# Patient Record
Sex: Female | Born: 1945 | Race: White | Hispanic: No | Smoking: Former smoker
Health system: Southern US, Community
[De-identification: ages and names within clinical notes are randomized; demographics above are authoritative.]

## PROBLEM LIST (undated history)

## (undated) DIAGNOSIS — J45909 Unspecified asthma, uncomplicated: Secondary | ICD-10-CM

## (undated) DIAGNOSIS — C50919 Malignant neoplasm of unspecified site of unspecified female breast: Secondary | ICD-10-CM

## (undated) DIAGNOSIS — I4891 Unspecified atrial fibrillation: Secondary | ICD-10-CM

## (undated) DIAGNOSIS — M1990 Unspecified osteoarthritis, unspecified site: Secondary | ICD-10-CM

## (undated) DIAGNOSIS — I10 Essential (primary) hypertension: Secondary | ICD-10-CM

## (undated) DIAGNOSIS — I429 Cardiomyopathy, unspecified: Secondary | ICD-10-CM

## (undated) DIAGNOSIS — R011 Cardiac murmur, unspecified: Secondary | ICD-10-CM

## (undated) DIAGNOSIS — M810 Age-related osteoporosis without current pathological fracture: Secondary | ICD-10-CM

## (undated) DIAGNOSIS — I2510 Atherosclerotic heart disease of native coronary artery without angina pectoris: Secondary | ICD-10-CM

## (undated) DIAGNOSIS — I639 Cerebral infarction, unspecified: Secondary | ICD-10-CM

## (undated) DIAGNOSIS — K219 Gastro-esophageal reflux disease without esophagitis: Secondary | ICD-10-CM

## (undated) DIAGNOSIS — I509 Heart failure, unspecified: Secondary | ICD-10-CM

## (undated) DIAGNOSIS — I219 Acute myocardial infarction, unspecified: Secondary | ICD-10-CM

## (undated) DIAGNOSIS — H269 Unspecified cataract: Secondary | ICD-10-CM

## (undated) DIAGNOSIS — T7840XA Allergy, unspecified, initial encounter: Secondary | ICD-10-CM

## (undated) DIAGNOSIS — E785 Hyperlipidemia, unspecified: Secondary | ICD-10-CM

## (undated) HISTORY — DX: Age-related osteoporosis without current pathological fracture: M81.0

## (undated) HISTORY — DX: Acute myocardial infarction, unspecified: I21.9

## (undated) HISTORY — PX: BREAST SURGERY: SHX581

## (undated) HISTORY — PX: APPENDECTOMY: SHX54

## (undated) HISTORY — DX: Essential (primary) hypertension: I10

## (undated) HISTORY — PX: CHOLECYSTECTOMY: SHX55

## (undated) HISTORY — DX: Cerebral infarction, unspecified: I63.9

## (undated) HISTORY — DX: Atherosclerotic heart disease of native coronary artery without angina pectoris: I25.10

## (undated) HISTORY — DX: Cardiac murmur, unspecified: R01.1

## (undated) HISTORY — DX: Unspecified osteoarthritis, unspecified site: M19.90

## (undated) HISTORY — PX: TUBAL LIGATION: SHX77

## (undated) HISTORY — DX: Malignant neoplasm of unspecified site of unspecified female breast: C50.919

## (undated) HISTORY — PX: CARDIAC CATHETERIZATION: SHX172

## (undated) HISTORY — DX: Allergy, unspecified, initial encounter: T78.40XA

## (undated) HISTORY — DX: Unspecified cataract: H26.9

## (undated) HISTORY — DX: Gastro-esophageal reflux disease without esophagitis: K21.9

## (undated) HISTORY — DX: Cardiomyopathy, unspecified: I42.9

## (undated) HISTORY — DX: Unspecified asthma, uncomplicated: J45.909

## (undated) HISTORY — PX: CORONARY ARTERY BYPASS GRAFT: SHX141

## (undated) HISTORY — DX: Unspecified atrial fibrillation: I48.91

## (undated) HISTORY — DX: Heart failure, unspecified: I50.9

## (undated) HISTORY — DX: Hyperlipidemia, unspecified: E78.5

## (undated) HISTORY — PX: EYE SURGERY: SHX253

## (undated) HISTORY — PX: FRACTURE SURGERY: SHX138

## (undated) HISTORY — PX: CARPAL TUNNEL RELEASE: SHX101

---

## 2015-12-08 DIAGNOSIS — I10 Essential (primary) hypertension: Secondary | ICD-10-CM | POA: Insufficient documentation

## 2015-12-08 DIAGNOSIS — Z951 Presence of aortocoronary bypass graft: Secondary | ICD-10-CM | POA: Insufficient documentation

## 2015-12-08 DIAGNOSIS — E039 Hypothyroidism, unspecified: Secondary | ICD-10-CM | POA: Insufficient documentation

## 2016-01-08 DIAGNOSIS — K219 Gastro-esophageal reflux disease without esophagitis: Secondary | ICD-10-CM | POA: Insufficient documentation

## 2016-11-09 DIAGNOSIS — N393 Stress incontinence (female) (male): Secondary | ICD-10-CM | POA: Insufficient documentation

## 2017-01-13 DIAGNOSIS — Z87442 Personal history of urinary calculi: Secondary | ICD-10-CM | POA: Insufficient documentation

## 2020-04-29 DIAGNOSIS — Z139 Encounter for screening, unspecified: Secondary | ICD-10-CM | POA: Diagnosis not present

## 2020-04-29 DIAGNOSIS — E039 Hypothyroidism, unspecified: Secondary | ICD-10-CM | POA: Diagnosis not present

## 2020-04-29 DIAGNOSIS — Z79899 Other long term (current) drug therapy: Secondary | ICD-10-CM | POA: Diagnosis not present

## 2020-04-29 DIAGNOSIS — Z1211 Encounter for screening for malignant neoplasm of colon: Secondary | ICD-10-CM | POA: Diagnosis not present

## 2020-04-29 DIAGNOSIS — E785 Hyperlipidemia, unspecified: Secondary | ICD-10-CM | POA: Diagnosis not present

## 2020-04-29 DIAGNOSIS — I10 Essential (primary) hypertension: Secondary | ICD-10-CM | POA: Diagnosis not present

## 2020-04-29 DIAGNOSIS — E559 Vitamin D deficiency, unspecified: Secondary | ICD-10-CM | POA: Diagnosis not present

## 2020-04-29 DIAGNOSIS — K219 Gastro-esophageal reflux disease without esophagitis: Secondary | ICD-10-CM | POA: Diagnosis not present

## 2020-04-29 DIAGNOSIS — Z9181 History of falling: Secondary | ICD-10-CM | POA: Diagnosis not present

## 2020-05-07 DIAGNOSIS — Z1212 Encounter for screening for malignant neoplasm of rectum: Secondary | ICD-10-CM | POA: Diagnosis not present

## 2020-05-07 DIAGNOSIS — Z1211 Encounter for screening for malignant neoplasm of colon: Secondary | ICD-10-CM | POA: Diagnosis not present

## 2020-05-10 LAB — COLOGUARD: COLOGUARD: NEGATIVE

## 2020-05-10 LAB — EXTERNAL GENERIC LAB PROCEDURE: COLOGUARD: NEGATIVE

## 2020-06-03 DIAGNOSIS — R05 Cough: Secondary | ICD-10-CM | POA: Diagnosis not present

## 2020-06-03 DIAGNOSIS — K219 Gastro-esophageal reflux disease without esophagitis: Secondary | ICD-10-CM | POA: Diagnosis not present

## 2020-06-03 DIAGNOSIS — E559 Vitamin D deficiency, unspecified: Secondary | ICD-10-CM | POA: Diagnosis not present

## 2020-06-03 DIAGNOSIS — E039 Hypothyroidism, unspecified: Secondary | ICD-10-CM | POA: Diagnosis not present

## 2020-06-03 DIAGNOSIS — Z1231 Encounter for screening mammogram for malignant neoplasm of breast: Secondary | ICD-10-CM | POA: Diagnosis not present

## 2020-06-03 DIAGNOSIS — J309 Allergic rhinitis, unspecified: Secondary | ICD-10-CM | POA: Diagnosis not present

## 2020-06-03 DIAGNOSIS — E785 Hyperlipidemia, unspecified: Secondary | ICD-10-CM | POA: Diagnosis not present

## 2020-06-03 DIAGNOSIS — I10 Essential (primary) hypertension: Secondary | ICD-10-CM | POA: Diagnosis not present

## 2020-06-03 DIAGNOSIS — Z6834 Body mass index (BMI) 34.0-34.9, adult: Secondary | ICD-10-CM | POA: Diagnosis not present

## 2020-06-17 DIAGNOSIS — Z961 Presence of intraocular lens: Secondary | ICD-10-CM | POA: Diagnosis not present

## 2020-06-17 DIAGNOSIS — H43393 Other vitreous opacities, bilateral: Secondary | ICD-10-CM | POA: Diagnosis not present

## 2020-06-17 DIAGNOSIS — H04129 Dry eye syndrome of unspecified lacrimal gland: Secondary | ICD-10-CM | POA: Diagnosis not present

## 2020-06-25 DIAGNOSIS — Z780 Asymptomatic menopausal state: Secondary | ICD-10-CM | POA: Diagnosis not present

## 2020-06-25 DIAGNOSIS — E2839 Other primary ovarian failure: Secondary | ICD-10-CM | POA: Diagnosis not present

## 2020-06-25 DIAGNOSIS — Z1231 Encounter for screening mammogram for malignant neoplasm of breast: Secondary | ICD-10-CM | POA: Diagnosis not present

## 2020-06-25 DIAGNOSIS — M85851 Other specified disorders of bone density and structure, right thigh: Secondary | ICD-10-CM | POA: Diagnosis not present

## 2020-09-02 DIAGNOSIS — J309 Allergic rhinitis, unspecified: Secondary | ICD-10-CM | POA: Diagnosis not present

## 2020-09-02 DIAGNOSIS — E559 Vitamin D deficiency, unspecified: Secondary | ICD-10-CM | POA: Diagnosis not present

## 2020-09-02 DIAGNOSIS — E039 Hypothyroidism, unspecified: Secondary | ICD-10-CM | POA: Diagnosis not present

## 2020-09-02 DIAGNOSIS — I10 Essential (primary) hypertension: Secondary | ICD-10-CM | POA: Diagnosis not present

## 2020-09-02 DIAGNOSIS — R059 Cough, unspecified: Secondary | ICD-10-CM | POA: Diagnosis not present

## 2020-09-02 DIAGNOSIS — Z6834 Body mass index (BMI) 34.0-34.9, adult: Secondary | ICD-10-CM | POA: Diagnosis not present

## 2020-09-02 DIAGNOSIS — E785 Hyperlipidemia, unspecified: Secondary | ICD-10-CM | POA: Diagnosis not present

## 2020-09-02 DIAGNOSIS — K219 Gastro-esophageal reflux disease without esophagitis: Secondary | ICD-10-CM | POA: Diagnosis not present

## 2020-09-02 DIAGNOSIS — N289 Disorder of kidney and ureter, unspecified: Secondary | ICD-10-CM | POA: Diagnosis not present

## 2020-11-06 DIAGNOSIS — R350 Frequency of micturition: Secondary | ICD-10-CM | POA: Diagnosis not present

## 2020-11-06 DIAGNOSIS — K219 Gastro-esophageal reflux disease without esophagitis: Secondary | ICD-10-CM | POA: Diagnosis not present

## 2020-11-06 DIAGNOSIS — Z6835 Body mass index (BMI) 35.0-35.9, adult: Secondary | ICD-10-CM | POA: Diagnosis not present

## 2021-01-06 DIAGNOSIS — N289 Disorder of kidney and ureter, unspecified: Secondary | ICD-10-CM | POA: Diagnosis not present

## 2021-01-06 DIAGNOSIS — Z6834 Body mass index (BMI) 34.0-34.9, adult: Secondary | ICD-10-CM | POA: Diagnosis not present

## 2021-01-06 DIAGNOSIS — I10 Essential (primary) hypertension: Secondary | ICD-10-CM | POA: Diagnosis not present

## 2021-01-06 DIAGNOSIS — J309 Allergic rhinitis, unspecified: Secondary | ICD-10-CM | POA: Diagnosis not present

## 2021-01-06 DIAGNOSIS — E039 Hypothyroidism, unspecified: Secondary | ICD-10-CM | POA: Diagnosis not present

## 2021-01-06 DIAGNOSIS — R059 Cough, unspecified: Secondary | ICD-10-CM | POA: Diagnosis not present

## 2021-01-06 DIAGNOSIS — K219 Gastro-esophageal reflux disease without esophagitis: Secondary | ICD-10-CM | POA: Diagnosis not present

## 2021-01-06 DIAGNOSIS — E559 Vitamin D deficiency, unspecified: Secondary | ICD-10-CM | POA: Diagnosis not present

## 2021-01-06 DIAGNOSIS — E785 Hyperlipidemia, unspecified: Secondary | ICD-10-CM | POA: Diagnosis not present

## 2021-01-06 DIAGNOSIS — Z79899 Other long term (current) drug therapy: Secondary | ICD-10-CM | POA: Diagnosis not present

## 2021-01-06 DIAGNOSIS — N390 Urinary tract infection, site not specified: Secondary | ICD-10-CM | POA: Diagnosis not present

## 2021-04-02 DIAGNOSIS — E039 Hypothyroidism, unspecified: Secondary | ICD-10-CM | POA: Diagnosis not present

## 2021-04-02 DIAGNOSIS — N289 Disorder of kidney and ureter, unspecified: Secondary | ICD-10-CM | POA: Diagnosis not present

## 2021-04-02 DIAGNOSIS — I10 Essential (primary) hypertension: Secondary | ICD-10-CM | POA: Diagnosis not present

## 2021-04-02 DIAGNOSIS — J309 Allergic rhinitis, unspecified: Secondary | ICD-10-CM | POA: Diagnosis not present

## 2021-04-02 DIAGNOSIS — Z6834 Body mass index (BMI) 34.0-34.9, adult: Secondary | ICD-10-CM | POA: Diagnosis not present

## 2021-04-02 DIAGNOSIS — E785 Hyperlipidemia, unspecified: Secondary | ICD-10-CM | POA: Diagnosis not present

## 2021-04-02 DIAGNOSIS — R059 Cough, unspecified: Secondary | ICD-10-CM | POA: Diagnosis not present

## 2021-04-02 DIAGNOSIS — E559 Vitamin D deficiency, unspecified: Secondary | ICD-10-CM | POA: Diagnosis not present

## 2021-04-02 DIAGNOSIS — K219 Gastro-esophageal reflux disease without esophagitis: Secondary | ICD-10-CM | POA: Diagnosis not present

## 2021-05-08 DIAGNOSIS — E669 Obesity, unspecified: Secondary | ICD-10-CM | POA: Diagnosis not present

## 2021-05-08 DIAGNOSIS — E785 Hyperlipidemia, unspecified: Secondary | ICD-10-CM | POA: Diagnosis not present

## 2021-05-08 DIAGNOSIS — Z139 Encounter for screening, unspecified: Secondary | ICD-10-CM | POA: Diagnosis not present

## 2021-05-08 DIAGNOSIS — Z0000 Encounter for general adult medical examination without abnormal findings: Secondary | ICD-10-CM | POA: Diagnosis not present

## 2021-05-08 DIAGNOSIS — Z9181 History of falling: Secondary | ICD-10-CM | POA: Diagnosis not present

## 2021-05-08 DIAGNOSIS — Z1331 Encounter for screening for depression: Secondary | ICD-10-CM | POA: Diagnosis not present

## 2021-05-22 ENCOUNTER — Other Ambulatory Visit: Payer: Self-pay

## 2021-05-22 ENCOUNTER — Emergency Department (HOSPITAL_COMMUNITY): Payer: Medicare HMO

## 2021-05-22 ENCOUNTER — Inpatient Hospital Stay (HOSPITAL_COMMUNITY)
Admission: EM | Admit: 2021-05-22 | Discharge: 2021-05-24 | DRG: 062 | Disposition: A | Discharge status: Home / Self Care | Payer: Medicare HMO | Attending: Neurology | Admitting: Neurology

## 2021-05-22 DIAGNOSIS — Z87891 Personal history of nicotine dependence: Secondary | ICD-10-CM

## 2021-05-22 DIAGNOSIS — E039 Hypothyroidism, unspecified: Secondary | ICD-10-CM | POA: Diagnosis not present

## 2021-05-22 DIAGNOSIS — Z713 Dietary counseling and surveillance: Secondary | ICD-10-CM

## 2021-05-22 DIAGNOSIS — Z9010 Acquired absence of unspecified breast and nipple: Secondary | ICD-10-CM | POA: Diagnosis not present

## 2021-05-22 DIAGNOSIS — Z20822 Contact with and (suspected) exposure to covid-19: Secondary | ICD-10-CM | POA: Diagnosis not present

## 2021-05-22 DIAGNOSIS — I6381 Other cerebral infarction due to occlusion or stenosis of small artery: Principal | ICD-10-CM | POA: Diagnosis present

## 2021-05-22 DIAGNOSIS — Z8249 Family history of ischemic heart disease and other diseases of the circulatory system: Secondary | ICD-10-CM | POA: Diagnosis not present

## 2021-05-22 DIAGNOSIS — G8191 Hemiplegia, unspecified affecting right dominant side: Secondary | ICD-10-CM | POA: Diagnosis present

## 2021-05-22 DIAGNOSIS — I491 Atrial premature depolarization: Secondary | ICD-10-CM | POA: Diagnosis not present

## 2021-05-22 DIAGNOSIS — R29704 NIHSS score 4: Secondary | ICD-10-CM | POA: Diagnosis present

## 2021-05-22 DIAGNOSIS — I252 Old myocardial infarction: Secondary | ICD-10-CM | POA: Diagnosis not present

## 2021-05-22 DIAGNOSIS — E119 Type 2 diabetes mellitus without complications: Secondary | ICD-10-CM | POA: Diagnosis present

## 2021-05-22 DIAGNOSIS — Z881 Allergy status to other antibiotic agents status: Secondary | ICD-10-CM | POA: Diagnosis not present

## 2021-05-22 DIAGNOSIS — I63233 Cerebral infarction due to unspecified occlusion or stenosis of bilateral carotid arteries: Secondary | ICD-10-CM | POA: Diagnosis not present

## 2021-05-22 DIAGNOSIS — Z853 Personal history of malignant neoplasm of breast: Secondary | ICD-10-CM

## 2021-05-22 DIAGNOSIS — I4891 Unspecified atrial fibrillation: Secondary | ICD-10-CM | POA: Diagnosis not present

## 2021-05-22 DIAGNOSIS — Z6834 Body mass index (BMI) 34.0-34.9, adult: Secondary | ICD-10-CM | POA: Diagnosis not present

## 2021-05-22 DIAGNOSIS — Z888 Allergy status to other drugs, medicaments and biological substances status: Secondary | ICD-10-CM

## 2021-05-22 DIAGNOSIS — Z7982 Long term (current) use of aspirin: Secondary | ICD-10-CM

## 2021-05-22 DIAGNOSIS — E785 Hyperlipidemia, unspecified: Secondary | ICD-10-CM | POA: Diagnosis present

## 2021-05-22 DIAGNOSIS — Z87442 Personal history of urinary calculi: Secondary | ICD-10-CM

## 2021-05-22 DIAGNOSIS — Z7951 Long term (current) use of inhaled steroids: Secondary | ICD-10-CM

## 2021-05-22 DIAGNOSIS — R000 Tachycardia, unspecified: Secondary | ICD-10-CM | POA: Diagnosis not present

## 2021-05-22 DIAGNOSIS — Z951 Presence of aortocoronary bypass graft: Secondary | ICD-10-CM

## 2021-05-22 DIAGNOSIS — K219 Gastro-esophageal reflux disease without esophagitis: Secondary | ICD-10-CM | POA: Diagnosis present

## 2021-05-22 DIAGNOSIS — R2990 Unspecified symptoms and signs involving the nervous system: Secondary | ICD-10-CM

## 2021-05-22 DIAGNOSIS — R29818 Other symptoms and signs involving the nervous system: Secondary | ICD-10-CM | POA: Diagnosis not present

## 2021-05-22 DIAGNOSIS — I2510 Atherosclerotic heart disease of native coronary artery without angina pectoris: Secondary | ICD-10-CM | POA: Diagnosis present

## 2021-05-22 DIAGNOSIS — I10 Essential (primary) hypertension: Secondary | ICD-10-CM | POA: Diagnosis not present

## 2021-05-22 DIAGNOSIS — I639 Cerebral infarction, unspecified: Secondary | ICD-10-CM | POA: Diagnosis not present

## 2021-05-22 DIAGNOSIS — Z79899 Other long term (current) drug therapy: Secondary | ICD-10-CM

## 2021-05-22 DIAGNOSIS — E6609 Other obesity due to excess calories: Secondary | ICD-10-CM | POA: Diagnosis present

## 2021-05-22 DIAGNOSIS — Z79890 Hormone replacement therapy (postmenopausal): Secondary | ICD-10-CM | POA: Diagnosis not present

## 2021-05-22 DIAGNOSIS — R270 Ataxia, unspecified: Secondary | ICD-10-CM | POA: Diagnosis present

## 2021-05-22 DIAGNOSIS — I499 Cardiac arrhythmia, unspecified: Secondary | ICD-10-CM | POA: Diagnosis not present

## 2021-05-22 DIAGNOSIS — I429 Cardiomyopathy, unspecified: Secondary | ICD-10-CM | POA: Diagnosis not present

## 2021-05-22 DIAGNOSIS — R0902 Hypoxemia: Secondary | ICD-10-CM | POA: Diagnosis not present

## 2021-05-22 DIAGNOSIS — E782 Mixed hyperlipidemia: Secondary | ICD-10-CM | POA: Diagnosis not present

## 2021-05-22 LAB — ETHANOL: Alcohol, Ethyl (B): <10 mg/dL (ref <10)

## 2021-05-22 LAB — COMPREHENSIVE METABOLIC PANEL
ALT: 22 U/L (ref 0–44)
AST: 20 U/L (ref 15–41)
Albumin: 3.8 g/dL (ref 3.5–5.0)
Alkaline Phosphatase: 74 U/L (ref 38–126)
Anion gap: 7 (ref 5–15)
BUN: 20 mg/dL (ref 8–23)
CO2: 24 mmol/L (ref 22–32)
Calcium: 8.8 mg/dL — ABNORMAL LOW (ref 8.9–10.3)
Chloride: 106 mmol/L (ref 98–111)
Creatinine, Ser: 1.15 mg/dL — ABNORMAL HIGH (ref 0.44–1.00)
GFR, Estimated: 50 mL/min — ABNORMAL LOW (ref >60)
Glucose, Bld: 117 mg/dL — ABNORMAL HIGH (ref 70–99)
Potassium: 3.7 mmol/L (ref 3.5–5.1)
Sodium: 137 mmol/L (ref 135–145)
Total Bilirubin: 0.8 mg/dL (ref 0.3–1.2)
Total Protein: 6.6 g/dL (ref 6.5–8.1)

## 2021-05-22 LAB — DIFFERENTIAL
Abs Immature Granulocytes: 0.04 10*3/uL (ref 0.00–0.07)
Basophils Absolute: 0.1 10*3/uL (ref 0.0–0.1)
Basophils Relative: 1 %
Eosinophils Absolute: 0.1 10*3/uL (ref 0.0–0.5)
Eosinophils Relative: 1 %
Immature Granulocytes: 0 %
Lymphocytes Relative: 19 %
Lymphs Abs: 2.2 10*3/uL (ref 0.7–4.0)
Monocytes Absolute: 0.8 10*3/uL (ref 0.1–1.0)
Monocytes Relative: 7 %
Neutro Abs: 8.2 10*3/uL — ABNORMAL HIGH (ref 1.7–7.7)
Neutrophils Relative %: 72 %

## 2021-05-22 LAB — APTT: aPTT: 25 seconds (ref 24–36)

## 2021-05-22 LAB — I-STAT CHEM 8, ED
BUN: 21 mg/dL (ref 8–23)
Calcium, Ion: 1.14 mmol/L — ABNORMAL LOW (ref 1.15–1.40)
Chloride: 105 mmol/L (ref 98–111)
Creatinine, Ser: 1.1 mg/dL — ABNORMAL HIGH (ref 0.44–1.00)
Glucose, Bld: 115 mg/dL — ABNORMAL HIGH (ref 70–99)
HCT: 37 % (ref 36.0–46.0)
Hemoglobin: 12.6 g/dL (ref 12.0–15.0)
Potassium: 3.8 mmol/L (ref 3.5–5.1)
Sodium: 140 mmol/L (ref 135–145)
TCO2: 24 mmol/L (ref 22–32)

## 2021-05-22 LAB — URINALYSIS, ROUTINE W REFLEX MICROSCOPIC
Bilirubin Urine: NEGATIVE
Glucose, UA: NEGATIVE mg/dL
Hgb urine dipstick: NEGATIVE
Ketones, ur: NEGATIVE mg/dL
Leukocytes,Ua: NEGATIVE
Nitrite: NEGATIVE
Protein, ur: NEGATIVE mg/dL
Specific Gravity, Urine: 1.005 (ref 1.005–1.030)
pH: 7 (ref 5.0–8.0)

## 2021-05-22 LAB — CBC
HCT: 38.6 % (ref 36.0–46.0)
Hemoglobin: 12.9 g/dL (ref 12.0–15.0)
MCH: 31.3 pg (ref 26.0–34.0)
MCHC: 33.4 g/dL (ref 30.0–36.0)
MCV: 93.7 fL (ref 80.0–100.0)
Platelets: 204 10*3/uL (ref 150–400)
RBC: 4.12 MIL/uL (ref 3.87–5.11)
RDW: 12.8 % (ref 11.5–15.5)
WBC: 11.4 10*3/uL — ABNORMAL HIGH (ref 4.0–10.5)
nRBC: 0 % (ref 0.0–0.2)

## 2021-05-22 LAB — RAPID URINE DRUG SCREEN, HOSP PERFORMED
Amphetamines: NOT DETECTED
Barbiturates: NOT DETECTED
Benzodiazepines: NOT DETECTED
Cocaine: NOT DETECTED
Opiates: NOT DETECTED
Tetrahydrocannabinol: NOT DETECTED

## 2021-05-22 LAB — RESP PANEL BY RT-PCR (FLU A&B, COVID) ARPGX2
Influenza A by PCR: NEGATIVE
Influenza B by PCR: NEGATIVE
SARS Coronavirus 2 by RT PCR: NEGATIVE

## 2021-05-22 LAB — CBG MONITORING, ED: Glucose-Capillary: 104 mg/dL — ABNORMAL HIGH (ref 70–99)

## 2021-05-22 LAB — PROTIME-INR
INR: 1 (ref 0.8–1.2)
Prothrombin Time: 13.4 seconds (ref 11.4–15.2)

## 2021-05-22 MED ORDER — MOMETASONE FURO-FORMOTEROL FUM 200-5 MCG/ACT IN AERO
2.0000 | INHALATION_SPRAY | Freq: Two times a day (BID) | RESPIRATORY_TRACT | Status: DC
  Administered 2021-05-23 – 2021-05-24 (×2): 2 via RESPIRATORY_TRACT
  Filled 2021-05-22: qty 8.8

## 2021-05-22 MED ORDER — SENNOSIDES-DOCUSATE SODIUM 8.6-50 MG PO TABS: 1.0000 | ORAL_TABLET | Freq: Every evening | ORAL | Status: DC | PRN

## 2021-05-22 MED ORDER — ACETAMINOPHEN 160 MG/5ML PO SOLN: 650.0000 mg | ORAL | Status: DC | PRN

## 2021-05-22 MED ORDER — STROKE: EARLY STAGES OF RECOVERY BOOK: Freq: Once | Status: DC

## 2021-05-22 MED ORDER — HYDRALAZINE HCL 20 MG/ML IJ SOLN: 5.0000 mg | Freq: Once | INTRAMUSCULAR | Status: DC

## 2021-05-22 MED ORDER — ALTEPLASE (STROKE) FULL DOSE INFUSION
0.9000 mg/kg | Freq: Once | INTRAVENOUS | Status: AC
  Administered 2021-05-22: 77.6 mg via INTRAVENOUS

## 2021-05-22 MED ORDER — PANTOPRAZOLE SODIUM 40 MG IV SOLR
40.0000 mg | Freq: Every day | INTRAVENOUS | Status: DC
  Administered 2021-05-22 – 2021-05-23 (×2): 40 mg via INTRAVENOUS
  Filled 2021-05-22 (×2): qty 40

## 2021-05-22 MED ORDER — ROSUVASTATIN CALCIUM 5 MG PO TABS
10.0000 mg | ORAL_TABLET | Freq: Every day | ORAL | Status: DC
  Administered 2021-05-23 – 2021-05-24 (×2): 10 mg via ORAL
  Filled 2021-05-22: qty 2
  Filled 2021-05-22 (×2): qty 1

## 2021-05-22 MED ORDER — CLEVIDIPINE BUTYRATE 0.5 MG/ML IV EMUL: 0.0000 mg/h | INTRAVENOUS | Status: DC

## 2021-05-22 MED ORDER — LEVOTHYROXINE SODIUM 25 MCG PO TABS
125.0000 ug | ORAL_TABLET | Freq: Every day | ORAL | Status: DC
  Administered 2021-05-23 – 2021-05-24 (×2): 125 ug via ORAL
  Filled 2021-05-22 (×2): qty 1

## 2021-05-22 MED ORDER — INSULIN ASPART 100 UNIT/ML IJ SOLN
0.0000 [IU] | INTRAMUSCULAR | Status: DC
  Administered 2021-05-23 – 2021-05-24 (×2): 2 [IU] via SUBCUTANEOUS

## 2021-05-22 MED ORDER — ACETAMINOPHEN 325 MG PO TABS
650.0000 mg | ORAL_TABLET | ORAL | Status: DC | PRN
  Administered 2021-05-23 – 2021-05-24 (×3): 650 mg via ORAL
  Filled 2021-05-22 (×3): qty 2

## 2021-05-22 MED ORDER — MONTELUKAST SODIUM 10 MG PO TABS
10.0000 mg | ORAL_TABLET | Freq: Every day | ORAL | Status: DC
  Administered 2021-05-23: 10 mg via ORAL
  Filled 2021-05-22 (×2): qty 1

## 2021-05-22 MED ORDER — ACETAMINOPHEN 650 MG RE SUPP: 650.0000 mg | RECTAL | Status: DC | PRN

## 2021-05-22 MED ORDER — ALBUTEROL SULFATE HFA 108 (90 BASE) MCG/ACT IN AERS
2.0000 | INHALATION_SPRAY | RESPIRATORY_TRACT | Status: DC | PRN
  Filled 2021-05-22: qty 6.7

## 2021-05-22 MED ORDER — SODIUM CHLORIDE 0.9 % IV SOLN
50.0000 mL | Freq: Once | INTRAVENOUS | Status: AC
  Administered 2021-05-22: 50 mL via INTRAVENOUS

## 2021-05-22 NOTE — ED Provider Notes (Signed)
[facility] EMERGENCY DEPARTMENT Provider Note   CSN: [id number] Arrival date & time: 05/22/21  1615     History Chief Complaint  Patient presents with   Weakness    Leah Day is a 75 y.o. female.  Patient presents chief complaint of right arm right leg right side of the face heaviness.  Heaviness sensation started approximately 3 PM.  Just prior to that she states that she was walking around feeling fine.  Then around 3 PM she felt like the right foot was dragging her right arm felt heavy.  Denies any fall or other trauma.  She spoke with her family who called ambulance and the patient was noted to be in new onset atrial fibrillation in the ambulance and was given diltiazem prior to arrival here in the ER.  She denies any headache no chest pain or abdominal pain.  No fever no cough no vomiting no diarrhea.      No past medical history on file.  Patient Active Problem List   Diagnosis Date Noted   Stroke determined by clinical assessment ([place]) 05/22/2021      OB History   No obstetric history on file.     No family history on file.     Home Medications Prior to Admission medications   Not on File    Allergies    Patient has no allergy information on record.  Review of Systems   Review of Systems  Constitutional:  Negative for fever.  HENT:  Negative for ear pain.   Eyes:  Negative for pain.  Respiratory:  Negative for cough.   Cardiovascular:  Negative for chest pain.  Gastrointestinal:  Negative for abdominal pain.  Genitourinary:  Negative for flank pain.  Musculoskeletal:  Negative for back pain.  Skin:  Negative for rash.  Neurological:  Negative for headaches.   Physical Exam Updated Vital Signs BP 135/84 (BP Location: Right Leg)   Pulse 79   Temp 98.2 F (36.8 C) (Oral)   Resp (!) 22   Ht 5\' 2"  (1.575 m)   Wt 86.2 kg   SpO2 97%   BMI 34.75 kg/m   Physical Exam Constitutional:      General: She is not in acute  distress.    Appearance: Normal appearance.  HENT:     Head: Normocephalic.     Nose: Nose normal.  Eyes:     Extraocular Movements: Extraocular movements intact.  Cardiovascular:     Rate and Rhythm: Normal rate.  Pulmonary:     Effort: Pulmonary effort is normal.  Musculoskeletal:        General: Normal range of motion.     Cervical back: Normal range of motion.  Neurological:     Mental Status: She is alert.     Comments: Moderate drift seen in right lower extremity leg lift.  Otherwise bilateral upper extremities have no drift.  Bilateral grip strength is 5/5.  Cranial nerves II through XII intact.  Speech is normal no slurred speech noted.    ED Results / Procedures / Treatments   Labs (all labs ordered are listed, but only abnormal results are displayed) Labs Reviewed  CBC - Abnormal; Notable for the following components:      Result Value   WBC 11.4 (*)    All other components within normal limits  DIFFERENTIAL - Abnormal; Notable for the following components:   Neutro Abs 8.2 (*)    All other components within normal limits  I-STAT  CHEM 8, ED - Abnormal; Notable for the following components:   Creatinine, Ser 1.10 (*)    Glucose, Bld 115 (*)    Calcium, Ion 1.14 (*)    All other components within normal limits  RESP PANEL BY RT-PCR (FLU A&B, COVID) ARPGX2  PROTIME-INR  APTT  ETHANOL  COMPREHENSIVE METABOLIC PANEL  RAPID URINE DRUG SCREEN, HOSP PERFORMED  URINALYSIS, ROUTINE W REFLEX MICROSCOPIC    EKG EKG Interpretation  Date/Time:  Friday May 22 2021 16:20:18 EDT Ventricular Rate:  77 PR Interval:    QRS Duration: 83 QT Interval:  377 QTC Calculation: 427 R Axis:   6 Text Interpretation: Atrial fibrillation Ventricular premature complex Anterior infarct, old Confirmed by Thamas Jaegers ([id number]) on 05/22/2021 4:25:33 PM  Radiology CT HEAD CODE STROKE WO CONTRAST  Result Date: 05/22/2021 CLINICAL DATA:  Code stroke. Acute neuro deficit. Right-sided  weakness. EXAM: CT HEAD WITHOUT CONTRAST TECHNIQUE: Contiguous axial images were obtained from the base of the skull through the vertex without intravenous contrast. COMPARISON:  None. FINDINGS: Brain: No evidence of acute infarction, hemorrhage, hydrocephalus, extra-axial collection or mass lesion/mass effect. Moderate white matter changes with diffuse white matter hypodensity. Vascular: Negative for hyperdense vessel Skull: Negative Sinuses/Orbits: Paranasal sinuses clear. Bilateral cataract extraction Other: None ASPECTS ([place] Stroke Program Early CT Score) - Ganglionic level infarction (caudate, lentiform nuclei, internal capsule, insula, M1-M3 cortex): 7 - Supraganglionic infarction (M4-M6 cortex): 3 Total score (0-10 with 10 being normal): 10 IMPRESSION: 1. No acute intracranial abnormality. Moderate white matter changes consistent with chronic microvascular ischemia 2. ASPECTS is 10 3. These results were called by telephone at the time of interpretation on 05/22/2021 at 5:03 pm to provider Bhagat, who verbally acknowledged these results. Electronically Signed   By: Franchot Gallo M.D.   On: 05/22/2021 17:03    Procedures .Critical Care  Date/Time: 05/22/2021 4:42 PM Performed by: Luna Fuse, MD Authorized by: Luna Fuse, MD   Critical care provider statement:    Critical care time (minutes):  30   Critical care time was exclusive of:  Separately billable procedures and treating other patients and teaching time   Critical care was necessary to treat or prevent imminent or life-threatening deterioration of the following conditions:  CNS failure or compromise   Medications Ordered in ED Medications  alteplase (ACTIVASE) 1 mg/mL infusion SOLN 77.6 mg (has no administration in time range)    Followed by  0.9 %  sodium chloride infusion (has no administration in time range)    ED Course  I have reviewed the triage vital signs and the nursing notes.  Pertinent labs & imaging results  that were available during my care of the patient were reviewed by me and considered in my medical decision making (see chart for details).    MDM Rules/Calculators/A&P                          Given onset of symptoms approximately 1 hour 40 minutes prior to arrival, stroke alert was activated.  Discussion regarding tPA determined by neurology team.  CT head is unremarkable.  Patient admitted by neuro hospitalist team.   Final Clinical Impression(s) / ED Diagnoses Final diagnoses:  Stroke-like symptoms    Rx / DC Orders ED Discharge Orders     None        Luna Fuse, MD 05/22/21 1715

## 2021-05-22 NOTE — ED Notes (Signed)
Echocardiogram not completed by this tech clicked tab in error

## 2021-05-22 NOTE — H&P (Signed)
Neurology H&P  CC: Right arm and leg heaviness  History is obtained from: Patient and chart review   HPI: Leah Day is a 75 y.o. left handed woman with a past medical history significant for hypertension, hyperlipidemia, coronary artery disease s/p CABG (2013 per patient report), hypothyroidism, obesity, kidney stones, GERD, remote breast cancer s/p mastectomy (1994 per patient, managed surgically only without chemotherapy)  She reports she was in her normal state of health today when she had sudden onset right-sided weakness around 3 or 3:30 PM.  She denies any other symptoms including language difficulty, headache, neck pain, fevers, infectious symptoms.  tPA contraindications were reviewed via checklist and all contraindications were denied by the patient and on review of immediately available records.  Risks and benefits of tPA administration were reviewed and she agreed to receive the medication.  LKW: 3 PM PA given?:  Yes at 7824 IA performed?: No, exam not consistent with large vessel occlusion Premorbid modified rankin scale:      0 - No symptoms.  ROS: All other review of systems was negative except as noted in the HPI.   No past medical history on file. Detailed above based on outside record review, and confirmed with patient  No family history on file.  Family History Medical History Relation Comments  Alcohol abuse Father     Coronary artery disease Father  MI  Diabetes Maternal Uncle     Alzheimer's disease Mother     Osteoporosis Other        Medications Medication Sig Dispensed Refills Start Date End Date Status  aspirin 81 MG EC tablet *ANTIPLATELET*   Take 81 mg by mouth daily.    0     Active  cetirizine (ZYRTEC) 10 MG tablet   Take 10 mg by mouth.   0     Active  rosuvastatin (CRESTOR) 10 MG tablet   Indications: Dyslipidemia Take 1 tablet (10 mg total) by mouth daily. 90 tablet   3 12/20/2019   Active  SYNTHROID 125 mcg tablet   Indications: Acquired  hypothyroidism Take 1 tablet (125 mcg total) by mouth Daily at 0600. 90 tablet   3 12/20/2019   Active  esomeprazole (NEXIUM) 40 MG capsule   Indications: Gastroesophageal reflux disease without esophagitis TAKE 1 CAPSULE BY MOUTH EVERY DAY 90 capsule   3 12/20/2019   Active  TIADYLT ER 120 mg 24 hr capsule   Indications: Essential hypertension TAKE 1 CAPSULE BY MOUTH EVERY DAY 30 capsule   0 10/14/2020   Active    Allergies   Allergies Active Allergy Reactions Severity Noted Date Comments  Atenolol Swelling (ALLERGY/intolerance)   12/31/2019    Irbesartan Cough (ALLERGY/intolerance)   12/08/2015    Cephalexin Rash (ALLERGY/intolerance) Medium 08/14/2012 Other reaction(s): Rash    Atorvastatin Myalgias (intolerance)   12/08/2015    Lisinopril Cough (ALLERGY/intolerance)   12/08/2015    Simvastatin Myalgias (intolerance)   12/08/2015      Social History:  has no history on file for tobacco use, alcohol use, and drug use. Former smoking, 1.5 packs a day for 30 years,   Exam: Current vital signs: BP (!) 172/105   Pulse 76   Temp 98.2 F (36.8 C) (Oral)   Resp 17   Ht 5\' 2"  (1.575 m)   Wt 86.2 kg   SpO2 97%   BMI 34.75 kg/m  Vital signs in last 24 hours: Temp:  [98.2 F (36.8 C)] 98.2 F (36.8 C) (07/15 1618) Pulse Rate:  [  75-84] 76 (07/15 1730) Resp:  [17-22] 17 (07/15 1730) BP: (135-172)/(63-105) 172/105 (07/15 1730) SpO2:  [97 %] 97 % (07/15 1719) Weight:  [86.2 kg] 86.2 kg (07/15 1621)   Physical Exam  Constitutional: Appears well-developed and well-nourished.  Psych: Affect appropriate to situation, calm and cooperative Eyes: No scleral injection HENT: No oropharyngeal obstruction.  MSK: no joint deformities.  Cardiovascular: Irregularly irregular, no known diagnosis of atrial fibrillation but confirmed on EKG Respiratory: Effort normal, non-labored breathing GI: Soft.  No distension. There is no tenderness.  Skin: Warm dry and intact visible  skin  Neuro: Mental Status: Patient is awake, alert, oriented to person, place, month, year, and situation. Patient is able to give a clear and coherent history. No signs of aphasia or neglect Cranial Nerves: II: Visual Fields are full. Pupils are equal, round, and reactive to light.   III,IV, VI: EOMI without ptosis or diploplia.  V: Facial sensation is symmetric to light touch VII: Facial movement is symmetric.  VIII: hearing is intact to voice X: Uvula elevates symmetrically XI: Shoulder shrug is symmetric. XII: tongue is midline without atrophy or fasciculations.  Motor: Tone is normal. Bulk is normal.  Drift of the right upper extremity which is improving after tPA initiation.  Drift of the right lower extremity which is improving after tPA administration.  No drift on the left upper or lower extremities. Sensory: On initial evaluation patient was complaining of right arm paresthesias and right leg partial numbness  Reflexes: Toes are mute bilaterally.  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  Recommendations: # Left lacunar stroke - Stroke labs TSH, HgbA1c, fasting lipid panel - MRI brain  - MRA of the brain without contrast and MRA neck w/wo  - Frequent neuro checks - Echocardiogram - Hold antiplatelet agents given tPA -  Risk factor modification - Telemetry monitoring; initiation of anticoagulation for new onset atrial fibrillation pending determination of final stroke size on MRI brain - Blood pressure goal  - Post tPA for 24  hours < 180/105 - PT consult, OT consult, Speech consult - Stroke team to follow - Patient was an OPTIMIST trial candidate but due to shift change and high volume of neurological consults, orders were not placed  #Hypothyroidism -Continue home Synthroid  #Hypertension, hyperlipidemia, diabetes, use of home inhalers -Blood pressure goals as above, hold home blood pressure meds for now -Goal LDL less than 70, adjust statin as needed -Sliding scale insulin, every 4 hours for now, just when patient is ordered for diet -Continue home inhalers  Lesleigh Noe MD-PhD [facility] [phone] Available 7 AM to 7 PM, outside these hours please contact Neurologist on call listed on AMION   Total critical care time: 40 minutes   Critical care time was exclusive of separately billable procedures and treating other patients.   Critical care was necessary to treat or  prevent imminent or life-threatening deterioration.   Critical care was time spent personally by me on the following activities: development of treatment plan with patient and/or surrogate as well as nursing, discussions with consultants/primary team, evaluation of patient's response to treatment, examination of patient, obtaining history from patient or surrogate, ordering and performing treatments and interventions, ordering and review of laboratory studies, ordering and review of radiographic studies, and re-evaluation of patient's condition as needed, as documented above.

## 2021-05-22 NOTE — ED Notes (Signed)
Notified neurologist about pt BP 180/94. If the provider would like to place an order for Labetalol.

## 2021-05-22 NOTE — Progress Notes (Signed)
PHARMACIST CODE STROKE RESPONSE  Notified to mix tPA at 1704 by Dr. Curly [place] Delivered tPA to RN at 1707 > Started at 1709 after priming and pump programing  tPA dose = 7.8 mg bolus over 1 minute followed by 69.8 mg for a total dose of 77.6 mg over 1 hour  Issues/delays encountered (if applicable):  IV pump shut-down at 1707 requiring to be turned back on prior to programing pump  Heloise Purpura 05/22/21 5:33 PM

## 2021-05-22 NOTE — ED Notes (Signed)
Attempted report x1. 

## 2021-05-22 NOTE — Code Documentation (Signed)
Stroke Response Nurse Documentation Code Stroke Documentation Leah Day  is a 75 y.o.  y.o. female  arriving to [facility] ED via [facility] on 05/22/2021 with PMH of HTN, HLD, Obesity, Hypothyroidism, CAD, s/p CABGx3. Code stroke was activated by ED. Patient from home where she was LKW at 1500 and experienced s/o of R weakness/numbness. Patient taking aspirin 81 mg daily PTA. Stroke team met patient in [place] and escorted her to CT with ED RN, CBG 115, labs drawn and patient cleared for CT by Dr. Almyra Free. NIHSS 2, see documentation for details and code stroke times. Patient with right limb ataxia and right decreased sensation on exam. The following imaging was completed:  CT Head. Patient is a candidate for tPA, consent obtained and medication started at 1709. Care/Plan: VS/mNIH q47m x2h, q39m x6h, q1h x16h. Full NIH at 2 hours post tPA administration. Bedside handoff with ED RN Larene Beach.    Lenore Manner  Stroke Response RN [phone]

## 2021-05-22 NOTE — ED Triage Notes (Signed)
Pt bib ems from home c/o of right sided heaviness starting from the head, arm and leg. Pt has cardiac hx triple bypass. Upon transport pt was in new onset of A-Fib. In route pt received Cardizem 20 mg and 500 cc NS.   BP 159/61 RA 98% Restricted on left side due to mastectomy  .

## 2021-05-23 ENCOUNTER — Inpatient Hospital Stay (HOSPITAL_COMMUNITY): Payer: Medicare HMO

## 2021-05-23 ENCOUNTER — Encounter (HOSPITAL_COMMUNITY): Payer: Self-pay | Admitting: Neurology

## 2021-05-23 DIAGNOSIS — R2990 Unspecified symptoms and signs involving the nervous system: Secondary | ICD-10-CM

## 2021-05-23 LAB — TSH: TSH: 0.834 u[IU]/mL (ref 0.350–4.500)

## 2021-05-23 LAB — MRSA NEXT GEN BY PCR, NASAL: MRSA by PCR Next Gen: NOT DETECTED

## 2021-05-23 LAB — LIPID PANEL
Cholesterol: 143 mg/dL (ref 0–200)
HDL: 49 mg/dL (ref >40)
LDL Cholesterol: 73 mg/dL (ref 0–99)
Total CHOL/HDL Ratio: 2.9 RATIO
Triglycerides: 106 mg/dL (ref <150)
VLDL: 21 mg/dL (ref 0–40)

## 2021-05-23 LAB — GLUCOSE, CAPILLARY
Glucose-Capillary: 97 mg/dL (ref 70–99)
Glucose-Capillary: 117 mg/dL — ABNORMAL HIGH (ref 70–99)
Glucose-Capillary: 90 mg/dL (ref 70–99)
Glucose-Capillary: 96 mg/dL (ref 70–99)
Glucose-Capillary: 147 mg/dL — ABNORMAL HIGH (ref 70–99)
Glucose-Capillary: 111 mg/dL — ABNORMAL HIGH (ref 70–99)
Glucose-Capillary: 127 mg/dL — ABNORMAL HIGH (ref 70–99)
Glucose-Capillary: 108 mg/dL — ABNORMAL HIGH (ref 70–99)

## 2021-05-23 LAB — ECHOCARDIOGRAM COMPLETE
Area-P 1/2: 4.06 cm2
Calc EF: 35.7 %
Height: 62 in
S' Lateral: 3.9 cm
Single Plane A2C EF: 22.6 %
Single Plane A4C EF: 46.3 %
Weight: 3121.71 oz

## 2021-05-23 LAB — BRAIN NATRIURETIC PEPTIDE: B Natriuretic Peptide: 408.2 pg/mL — ABNORMAL HIGH (ref 0.0–100.0)

## 2021-05-23 LAB — HEMOGLOBIN A1C
Hgb A1c MFr Bld: 5.6 % (ref 4.8–5.6)
Mean Plasma Glucose: 114.02 mg/dL

## 2021-05-23 MED ORDER — ALBUTEROL SULFATE (2.5 MG/3ML) 0.083% IN NEBU: 2.5000 mg | INHALATION_SOLUTION | RESPIRATORY_TRACT | Status: DC | PRN

## 2021-05-23 MED ORDER — APIXABAN 5 MG PO TABS
5.0000 mg | ORAL_TABLET | Freq: Two times a day (BID) | ORAL | Status: DC
  Administered 2021-05-23 – 2021-05-24 (×2): 5 mg via ORAL
  Filled 2021-05-23 (×2): qty 1

## 2021-05-23 MED ORDER — GADOBUTROL 1 MMOL/ML IV SOLN
8.8000 mL | Freq: Once | INTRAVENOUS | Status: AC | PRN
  Administered 2021-05-23: 8.8 mL via INTRAVENOUS

## 2021-05-23 MED ORDER — CHLORHEXIDINE GLUCONATE CLOTH 2 % EX PADS
6.0000 | MEDICATED_PAD | Freq: Every day | CUTANEOUS | Status: DC
  Administered 2021-05-23 – 2021-05-24 (×2): 6 via TOPICAL

## 2021-05-23 MED ORDER — SACUBITRIL-VALSARTAN 24-26 MG PO TABS
1.0000 | ORAL_TABLET | Freq: Two times a day (BID) | ORAL | Status: DC
  Administered 2021-05-24: 1 via ORAL
  Filled 2021-05-23 (×3): qty 1

## 2021-05-23 MED ORDER — METOPROLOL TARTRATE 25 MG PO TABS
25.0000 mg | ORAL_TABLET | Freq: Two times a day (BID) | ORAL | Status: DC
  Administered 2021-05-24: 25 mg via ORAL
  Filled 2021-05-23: qty 1

## 2021-05-23 MED ORDER — DILTIAZEM HCL ER COATED BEADS 120 MG PO CP24
120.0000 mg | ORAL_CAPSULE | Freq: Every day | ORAL | Status: DC
  Administered 2021-05-23: 120 mg via ORAL
  Filled 2021-05-23: qty 1

## 2021-05-23 MED ORDER — HYDRALAZINE HCL 20 MG/ML IJ SOLN: 10.0000 mg | INTRAMUSCULAR | Status: DC | PRN

## 2021-05-23 MED ORDER — PERFLUTREN LIPID MICROSPHERE
1.0000 mL | INTRAVENOUS | Status: AC | PRN
  Administered 2021-05-23: 2 mL via INTRAVENOUS
  Filled 2021-05-23: qty 10

## 2021-05-23 NOTE — Progress Notes (Signed)
  Echocardiogram 2D Echocardiogram has been performed.  Darlina Sicilian M 05/23/2021, 10:46 AM

## 2021-05-23 NOTE — Progress Notes (Signed)
PT Cancellation Note  Patient Details Name: Leah Day MRN: [id number] DOB: Jun 25, 1946   Cancelled Treatment:    Reason Eval/Treat Not Completed: Active bedrest order.   Leighton Roach, [place]  Pager [phone] Office [place] 05/23/2021, 8:03 AM

## 2021-05-23 NOTE — ED Notes (Signed)
Neurologist at bedside. Informed hydralazine and cleviprex  To be given if BP 180/105 or higher.

## 2021-05-23 NOTE — Consult Note (Signed)
CARDIOLOGY CONSULT NOTE  Patient ID: Leah Day MRN: [id number] DOB/AGE: 1946/02/02 75 y.o.  Admit date: 05/22/2021 Attending physician: Stroke, Md, MD Primary Physician:  Nicholos Johns, MD Outpatient Cardiologist: NA Inpatient Cardiologist: Rex Kras, DO, [facility]  Reason of consultation: Atrial Fibrillation  Referring physician: Dr. Antony Contras  Chief complaint: right sided heaviness arm and leg.   HPI:  Leah Day is a 75 y.o. Caucasian female who presents with a chief complaint of " right sided arm and leg heaviness." Her past medical history and cardiovascular risk factors include: Established coronary artery disease status post three-vessel CABG (2013), hypertension, hyperlipidemia, history of left-sided breast cancer status postmastectomy, former smoker, postmenopausal female, advanced age, obesity due to excess calories.  Patient was in her usual health yesterday morning and later that day with walking to her pantry and started noticing her right arm and leg heaviness.  She did not have any facial asymmetry or speech impairment.  He came to the hospital and received IV tPA shortly thereafter and currently is asymptomatic.   During hospitalization she was found to have A. fib and cardiology consulted for further recommendations.  No prior history of atrial fibrillation.  Patient states that he stopped seeing a cardiologist back in 2013 after her bypass surgery.  She is not following up with her PCP.  She does not recall having an echocardiogram in the recent past.  ALLERGIES: Allergies  Allergen Reactions   Cephalexin Rash        Atenolol Swelling   Irbesartan Cough   Lisinopril Cough    PAST MEDICAL HISTORY: Coronary artery disease status post CABG 2013 at [place]. Hypertension. Hyperlipidemia. Breast cancer, left-sided status postmastectomy  PAST SURGICAL HISTORY: PCI to the coronary artery Three-vessel CABG, 2013 Bilateral carpal tunnel  surgery. Tubal ligation. Appendectomy.  FAMILY HISTORY: No family history of premature CAD.  Father passed away at the age of 43 due to myocardial infarction.  And mother passed away at the age of 40 due to Alzheimer's disease complications.  SOCIAL HISTORY:  The patient  reports that she has quit smoking. Her smoking use included cigarettes. She has a 45.00 pack-year smoking history. She has never used smokeless tobacco. She reports that she does not use drugs.  MEDICATIONS: Current Outpatient Medications  Medication Instructions   albuterol (VENTOLIN HFA) 108 (90 Base) MCG/ACT inhaler 2 puffs, Inhalation, As needed   aspirin 81 mg, Oral, Daily   esomeprazole (NEXIUM) 40 mg, Oral, Daily   famotidine (PEPCID) 20 mg, Oral, As needed   levothyroxine (SYNTHROID) 125 mcg, Oral, Daily   loratadine (CLARITIN) 10 mg, Oral, Daily   montelukast (SINGULAIR) 10 mg, Oral, Daily at bedtime   rosuvastatin (CRESTOR) 10 mg, Oral, Daily   SYMBICORT 160-4.5 MCG/ACT inhaler 2 puffs, Inhalation, 2 times daily   Tiadylt ER 120 mg, Oral, Daily    REVIEW OF SYSTEMS: Review of Systems  Constitutional: Negative for chills and fever.  HENT:  Negative for hoarse voice and nosebleeds.   Eyes:  Negative for discharge, double vision and pain.  Cardiovascular:  Negative for chest pain, claudication, dyspnea on exertion, leg swelling, near-syncope, orthopnea, palpitations, paroxysmal nocturnal dyspnea and syncope.  Respiratory:  Positive for shortness of breath (chronic and stable). Negative for hemoptysis.   Musculoskeletal:  Negative for muscle cramps and myalgias.  Gastrointestinal:  Negative for abdominal pain, constipation, diarrhea, hematemesis, hematochezia, melena, nausea and vomiting.  Neurological:  Positive for focal weakness (improving). Negative for dizziness and light-headedness.  Psychiatric/Behavioral:  Negative for hallucinations  and suicidal ideas.   All other systems reviewed and are  negative.  PHYSICAL EXAM: Vitals with BMI 05/23/2021 05/23/2021 05/23/2021  Height - - -  Weight - - -  BMI - - -  Systolic 161 096 045  Diastolic 99 95 95  Pulse 86 80 74     Intake/Output Summary (Last 24 hours) at 05/23/2021 1128 Last data filed at 05/23/2021 0700 Gross per 24 hour  Intake 170 ml  Output 500 ml  Net -330 ml    Net IO Since Admission: -330 mL [05/23/21 1128]  CONSTITUTIONAL: Age-appropriate female, well-developed and well-nourished. No acute distress.  SKIN: Skin is warm and dry. No rash noted. No cyanosis. No pallor. No jaundice HEAD: Normocephalic and atraumatic.  EYES: No scleral icterus MOUTH/THROAT: Moist oral membranes.  NECK: No JVD present. No thyromegaly noted. No carotid bruits  LYMPHATIC: No visible cervical adenopathy.  CHEST Normal respiratory effort. No intercostal retractions  LUNGS: Clear to auscultation bilaterally.  No stridor. No wheezes. No rales.  CARDIOVASCULAR: Irregularly irregular, positive W0-J8, soft holosystolic murmur heard at the apex, no gallops or rubs ABDOMINAL: Obese, soft, nontender, nondistended, positive bowel sounds in all 4 quadrants, no apparent ascites.  EXTREMITIES: No peripheral edema  HEMATOLOGIC: No significant bruising NEUROLOGIC: Oriented to person, place, and time. Nonfocal. Normal muscle tone.  PSYCHIATRIC: Normal mood and affect. Normal behavior. Cooperative  RADIOLOGY: CT HEAD CODE STROKE WO CONTRAST  Result Date: 05/22/2021 CLINICAL DATA:  Code stroke. Acute neuro deficit. Right-sided weakness. EXAM: CT HEAD WITHOUT CONTRAST TECHNIQUE: Contiguous axial images were obtained from the base of the skull through the vertex without intravenous contrast. COMPARISON:  None. FINDINGS: Brain: No evidence of acute infarction, hemorrhage, hydrocephalus, extra-axial collection or mass lesion/mass effect. Moderate white matter changes with diffuse white matter hypodensity. Vascular: Negative for hyperdense vessel Skull:  Negative Sinuses/Orbits: Paranasal sinuses clear. Bilateral cataract extraction Other: None ASPECTS ([place] Stroke Program Early CT Score) - Ganglionic level infarction (caudate, lentiform nuclei, internal capsule, insula, M1-M3 cortex): 7 - Supraganglionic infarction (M4-M6 cortex): 3 Total score (0-10 with 10 being normal): 10 IMPRESSION: 1. No acute intracranial abnormality. Moderate white matter changes consistent with chronic microvascular ischemia 2. ASPECTS is 10 3. These results were called by telephone at the time of interpretation on 05/22/2021 at 5:03 pm to provider Bhagat, who verbally acknowledged these results. Electronically Signed   By: Franchot Gallo M.D.   On: 05/22/2021 17:03    LABORATORY DATA: Lab Results  Component Value Date   WBC 11.4 (H) 05/22/2021   HGB 12.6 05/22/2021   HCT 37.0 05/22/2021   MCV 93.7 05/22/2021   PLT 204 05/22/2021    Recent Labs  Lab 05/22/21 1626 05/22/21 1644  NA 137 140  K 3.7 3.8  CL 106 105  CO2 24  --   BUN 20 21  CREATININE 1.15* 1.10*  CALCIUM 8.8*  --   PROT 6.6  --   BILITOT 0.8  --   ALKPHOS 74  --   ALT 22  --   AST 20  --   GLUCOSE 117* 115*    Lipid Panel  No results found for: CHOL, HDL, LDLCALC, LDLDIRECT, TRIG, CHOLHDL  BNP (last 3 results) No results for input(s): BNP in the last 8760 hours.  HEMOGLOBIN A1C No results found for: HGBA1C, MPG  Cardiac Panel (last 3 results) No results for input(s): CKTOTAL, CKMB, TROPONINIHS, RELINDX in the last 72 hours.   TSH No results for input(s): TSH in the  last 8760 hours.   CARDIAC DATABASE: EKG: 05/22/2021: Atrial fibrillation, controlled ventricular rate, 77 bpm, rare PVCs, without underlying ischemia or injury pattern.  Echocardiogram: 05/23/2021: LVEF 30-35%, global hypokinesis, average GLS -11.4%, moderately reduced right ventricular function, normal right ventricular size, mild MR, aortic valve sclerosis without stenosis, no LV thrombus.  IMPRESSION &  RECOMMENDATIONS: Leah Day is a 75 y.o. Caucasian female whose past medical history and cardiovascular risk factors include: Established coronary artery disease status post three-vessel CABG (2013), hypertension, hyperlipidemia, history of left-sided breast cancer status postmastectomy, former smoker, postmenopausal female, advanced age, obesity due to excess calories.  Atrial fibrillation controlled ventricular rate: Newly discovered July 2022 Rate control: Discontinue diltiazem due to reduced LVEF.  Transition to Lopressor Rhythm control: N/A Thromboembolic prophylaxis: Patient is agreeable to be on oral anticoagulation once cleared by neurology since she is status post tPA.  We will await the recommendations. Risks, benefits, and alternatives to oral anticoagulation discussed.  She will discuss it further with her son prior to making a final decision.  Long-term oral anticoagulation: Indication: Atrial fibrillation CHA2DS2-VASc SCORE is 6 which correlates to 9.8% risk of stroke per year. Patient has been educated on the importance of monitoring for evidence of bleeding which includes but not limited to hemoptysis, hematochezia, melanotic stools, and hematuria. Patient educated on fall precautions and if she was to be injured despite the mechanism of injury she is to seek medical attention at the closest ER since she is on blood thinners.  Patient understands importance of this because if internal bleeding is not treated in a timely manner it may further lead to morbidity and/or mortality.  Patient voices understanding of these recommendations and provides verbal feedback.  Left lacunar stroke: Present on admission status post tPA 05/22/2021 Currently managed by neurology MRI/MRA performed, results reviewed.  Cardiomyopathy, suspect ischemic etiology Status post CABG, three-vessel, 2013 at [facility] Since 2013 patient states that she has not followed up with cardiology. I suspect her  underlying cardiomyopathy is compensated as she is overall euvolemic and not decompensated. Given the reduced LVEF we will discontinue diltiazem Start Lopressor 25 mg p.o. twice daily tomorrow Start Entresto 24/26 mg p.o. twice daily.  Patient is allergic to ARB which caused her to cough.  However she is willing to be on the [facility] after discussing risk versus benefit. Will uptitrate GDMT in a stepwise fashion given her recent stroke.  Established coronary artery disease with prior history of PCI followed by three-vessel CABG without angina pectoris: Would recommend reinitiation of aspirin 81 mg p.o. daily once cleared by neurology. Fasting lipid profile pending Recommend statin therapy Echocardiogram results reviewed and noted above Would recommend stress test as an outpatient basis given her LVEF and no recent ischemic evaluation.  Patient is agreeable to either follow-up with our practice or one closer to her home status post discharge.  Other: Benign essential hypertension: Currently being managed by primary team given her recent stroke.  Hyperlipidemia: Fasting lipid profile pending.  Currently on statin therapy.  Former smoker: Educated on the importance of complete smoking cessation.   Total encounter time 85 minutes. *Total Encounter Time as defined by the Centers for Medicare and Medicaid Services includes, in addition to the face-to-face time of a patient visit (documented in the note above) non-face-to-face time: obtaining and reviewing outside history, ordering and reviewing medications, tests or procedures, care coordination (communications with other health care professionals or caregivers) and documentation in the medical record.  Patient's questions and concerns were addressed  to her satisfaction. She voices understanding of the instructions provided during this encounter.   This note was created using a voice recognition software as a result there may be grammatical errors  inadvertently enclosed that do not reflect the nature of this encounter. Every attempt is made to correct such errors.  Mechele Claude [facility]  Pager: [phone] Office: [phone] 05/23/2021, 11:28 AM

## 2021-05-23 NOTE — Progress Notes (Signed)
STROKE TEAM PROGRESS NOTE   INTERVAL HISTORY Patient is lying comfortably in bed.  Blood pressure adequately controlled.  Right-sided weakness is resolved completely.  Post tPA imaging is pending.  She has no complaints.  Vitals:   05/23/21 0800 05/23/21 0830 05/23/21 0900 05/23/21 0933  BP: (!) 164/90 (!) 166/95 (!) 159/95 (!) 156/99  Pulse: 71 74 80 86  Resp: 17 16 16 16   Temp:      TempSrc:      SpO2: 95% 94% 93% 97%  Weight:      Height:       CBC:  Recent Labs  Lab 05/22/21 1626 05/22/21 1644  WBC 11.4*  --   NEUTROABS 8.2*  --   HGB 12.9 12.6  HCT 38.6 37.0  MCV 93.7  --   PLT 204  --    Basic Metabolic Panel:  Recent Labs  Lab 05/22/21 1626 05/22/21 1644  NA 137 140  K 3.7 3.8  CL 106 105  CO2 24  --   GLUCOSE 117* 115*  BUN 20 21  CREATININE 1.15* 1.10*  CALCIUM 8.8*  --    Lipid Panel: No results for input(s): CHOL, TRIG, HDL, CHOLHDL, VLDL, LDLCALC in the last 168 hours. HgbA1c: No results for input(s): HGBA1C in the last 168 hours. Urine Drug Screen:  Recent Labs  Lab 05/22/21 1848  LABOPIA NONE DETECTED  COCAINSCRNUR NONE DETECTED  LABBENZ NONE DETECTED  AMPHETMU NONE DETECTED  THCU NONE DETECTED  LABBARB NONE DETECTED    Alcohol Level  Recent Labs  Lab 05/22/21 1626  ETH <10    IMAGING past 24 hours CT HEAD CODE STROKE WO CONTRAST  Result Date: 05/22/2021  IMPRESSION: 1. No acute intracranial abnormality. Moderate white matter changes consistent with chronic microvascular ischemia 2. ASPECTS is 10   PHYSICAL EXAM Pleasant elderly Caucasian lady not in distress. . Afebrile. Head is nontraumatic. Neck is supple without bruit.    Cardiac exam no murmur or gallop. Lungs are clear to auscultation. Distal pulses are well felt.  Neurological Exam ;  Awake  Alert oriented x 3. Normal speech and language.eye movements full without nystagmus.fundi were not visualized. Vision acuity and fields appear normal. Hearing is normal. Palatal movements  are normal. Face symmetric. Tongue midline. Normal strength, tone, reflexes and coordination. Normal sensation. Gait deferred.  ASSESSMENT/PLAN [name] is a 75 y.o. female with history of hypertension, hyperlipidemia, coronary artery disease s/p CABG (2013 per patient report), hypothyroidism, obesity, kidney stones, GERD, remote breast cancer s/p mastectomy (1994) presenting with right sided weakness approximately.  She received tPA at 1709.  No large vessel occlusion seen on imagining.     Stroke:  left lacunar stroke likely embolic in the setting of new onset of atrial fibrillation, not on anticoagulation.  Code Stroke CT head: chronic microvascular ischemia. ASPECTS 10.    MRI  pending MRA  pending 2D Echo pending LDL No results found for requested labs within last 26280 hours. HgbA1c No results found for requested labs within last 26280 hours. VTE prophylaxis - SCDs for now until post tPA obtained Diet: heart healthy aspirin 81 mg daily prior to admission, now on No antithrombotic. S/p tPA. She has new onset atrial fibrillation.  Cardiology consulted for anticoagulation recommendations Therapy recommendations:  pending Disposition:  pending  Hypertension Home meds:  Tiadylt ER 120mg  daily Stable BP goal < 180/105 for 24 hours then allow for permissive hypertension (OK if < 220/120) but gradually normalize in 5-7 days Long-term  BP goal normotensive  Hyperlipidemia Home meds:  crestor 10mg  daily, resumed in hospital LDL pending, goal < 70 Continue statin at discharge  Atrial Fibrillation New onset of atrial fibrillation noted on admission EKG Cardiology consulted Appreciate recs  Diabetes type II Controlled Home meds:  none HgbA1c pending goal < 7.0 CBGs Recent Labs    05/22/21 2343 05/23/21 0310 05/23/21 0717  GLUCAP 104* 97 117*    SSI  Other Stroke Risk Factors Advanced Age >/= 45  Former Cigarette smoker, 1.5 packs per day for 30 years Obesity, Body  mass index is 35.69 kg/m., BMI >/= 30 associated with increased stroke risk, recommend weight loss, diet and exercise as appropriate  Hx stroke/TIA Coronary artery disease: CABG 2013  Other Active Problems GERD: Nexium Hypothyroidism: Synthroid 125 mcg daily Breast cancer [facility] day # 1  Leah Day, ACNP-BC Stroke NP  STROKE MD NOTE : I have personally obtained history,examined this patient, reviewed notes, independently viewed imaging studies, participated in medical decision making and plan of care.ROS completed by me personally and pertinent positives fully documented  I have made any additions or clarifications directly to the above note. Agree with note above.  She presented with sudden onset of right hemiparesis with NIH stroke scale of 4 and received IV tPA and has made near complete recovery.  Stroke etiology is likely atrial fibrillation and cardiogenic embolism.  Recommend continue close blood pressure control as per post tPA protocol and continue ongoing stroke work-up and aggressive risk factor modification.  Check MRI scan of the brain later today.  Mobilize out of bed.  Therapy consults.  Recommend cardiology consult for new onset A. fib.  No family at the bedside for discussion. This patient is critically ill and at significant risk of neurological worsening, death and care requires constant monitoring of vital signs, hemodynamics,respiratory and cardiac monitoring, extensive review of multiple databases, frequent neurological assessment, discussion with family, other specialists and medical decision making of high complexity.I have made any additions or clarifications directly to the above note.This critical care time does not reflect procedure time, or teaching time or supervisory time of PA/NP/Med Resident etc but could involve care discussion time.  I spent 30 minutes of neurocritical care time  in the care of  this patient.     Leah Contras, MD Medical  Director [facility] Pager: [phone] 05/23/2021 1:26 PM  To contact Stroke Continuity provider, please refer to [email or web address] After hours, contact General Neurology

## 2021-05-23 NOTE — Progress Notes (Signed)
ANTICOAGULATION CONSULT NOTE - Initial Consult  Pharmacy Consult for apixaban Indication: atrial fibrillation  Allergies  Allergen Reactions   Cephalexin Rash        Atenolol Swelling   Irbesartan Cough   Lisinopril Cough    Patient Measurements: Height: 5\' 2"  (157.5 cm) Weight: 88.5 kg (195 lb 1.7 oz) IBW/kg (Calculated) : 50.1  Vital Signs: Temp: 97.9 F (36.6 C) (07/16 1330) Temp Source: Oral (07/16 1330) BP: 157/98 (07/16 1330) Pulse Rate: 97 (07/16 1330)  Labs: Recent Labs    05/22/21 1626 05/22/21 1644  HGB 12.9 12.6  HCT 38.6 37.0  PLT 204  --   APTT 25  --   LABPROT 13.4  --   INR 1.0  --   CREATININE 1.15* 1.10*    Estimated Creatinine Clearance: 47.1 mL/min (A) (by C-G formula based on SCr of 1.1 mg/dL (H)).   Medical History: History reviewed. No pertinent past medical history.  Medications:  Medications Prior to Admission  Medication Sig Dispense Refill Last Dose   albuterol (VENTOLIN HFA) 108 (90 Base) MCG/ACT inhaler Inhale 2 puffs into the lungs as needed for wheezing or shortness of breath.   05/21/2021   aspirin 81 MG EC tablet Take 81 mg by mouth daily.   05/22/2021   esomeprazole (NEXIUM) 40 MG capsule Take 40 mg by mouth daily.   05/22/2021   famotidine (PEPCID) 20 MG tablet Take 20 mg by mouth as needed for heartburn.   unknown   levothyroxine (SYNTHROID) 125 MCG tablet Take 125 mcg by mouth daily.   05/22/2021   loratadine (CLARITIN) 10 MG tablet Take 10 mg by mouth daily.   05/22/2021   montelukast (SINGULAIR) 10 MG tablet Take 10 mg by mouth at bedtime.   05/21/2021   rosuvastatin (CRESTOR) 10 MG tablet Take 10 mg by mouth daily.   05/22/2021   SYMBICORT 160-4.5 MCG/ACT inhaler Inhale 2 puffs into the lungs 2 (two) times daily.   05/22/2021   TIADYLT ER 120 MG 24 hr capsule Take 120 mg by mouth daily.   05/22/2021   Scheduled:    stroke: mapping our early stages of recovery book   Does not apply Once   Chlorhexidine Gluconate Cloth  6 each  Topical Q0600   diltiazem  120 mg Oral Daily   hydrALAZINE  5 mg Intravenous Once   insulin aspart  0-15 Units Subcutaneous Q4H   levothyroxine  125 mcg Oral Q0600   mometasone-formoterol  2 puff Inhalation BID   montelukast  10 mg Oral QHS   pantoprazole (PROTONIX) IV  40 mg Intravenous QHS   rosuvastatin  10 mg Oral Daily    Assessment: 75 yo female with CVA in setting of afib> pharmacy consulted to dose apixaban. Alteplase given ~ 5pm on 7/15 and apixan can start 24 hours after -Hg= 12.6, SCr= 1.1  Goal of Therapy:  Monitor platelets by anticoagulation protocol: Yes   Plan:  -Apixaban 5mg  po bid (start tonight) -Will follow pt progress  Hildred Laser, PharmD Clinical Pharmacist **Pharmacist phone directory can now be found on [email or web address] ([email or web address]).  Listed under [place].

## 2021-05-23 NOTE — Progress Notes (Signed)
OT Cancellation Note  Patient Details Name: Erielle Gawronski MRN: [id number] DOB: January 28, 1946   Cancelled Treatment:    Reason Eval/Treat Not Completed: Active bedrest order (tPA given 7/15 1709. Will assess when activity orders updated. thank you)  Myrene Buddy Angelamarie Avakian, OT/L   Acute OT Clinical Specialist Acute Rehabilitation Services Pager [phone] Office [phone]  05/23/2021, 7:57 AM

## 2021-05-23 NOTE — Evaluation (Signed)
Physical Therapy Evaluation and Discharge Patient Details Name: Leah Day MRN: [id number] DOB: 1946/05/08 Today's Date: 05/23/2021   History of Present Illness  [name] is a 75 y.o. female with R sided weakness who received tPA. Past medical history of hypertension, hyperlipidemia, coronary artery disease s/p CABG, hypothyroidism, obesity, kidney stones, GERD, remote breast cancer s/p mastectomy (1994) presenting with right sided weakness approximately.  Clinical Impression  Patient evaluated by Physical Therapy with no further acute PT needs identified. All education has been completed and the patient has no further questions. Pt's symptoms have resolved by time of PT eval. Pt independent with mobility without AD. Discussed CVA sxs and activity level for return home. Son present end of session.  See below for any follow-up Physical Therapy or equipment needs. PT is signing off. Thank you for this referral.     Follow Up Recommendations No PT follow up    Equipment Recommendations  None recommended by PT    Recommendations for Other Services       Precautions / Restrictions Precautions Precautions: Fall Restrictions Weight Bearing Restrictions: No      Mobility  Bed Mobility Overal bed mobility: Independent                  Transfers Overall transfer level: Independent                  Ambulation/Gait Ambulation/Gait assistance: Modified independent (Device/Increase time) Gait Distance (Feet): 200 Feet Assistive device: None Gait Pattern/deviations: WFL(Within Functional Limits) Gait velocity: decreased Gait velocity interpretation: >2.62 ft/sec, indicative of community ambulatory (guarded due to lines) General Gait Details: pt with slightly slowed gait and felt unsteady but increased pace with time up  Stairs            Wheelchair Mobility    Modified Rankin (Stroke Patients Only) Modified Rankin (Stroke Patients  Only) Pre-Morbid Rankin Score: No symptoms Modified Rankin: No symptoms     Balance Overall balance assessment: Modified Independent                                           Pertinent Vitals/Pain Pain Assessment: Faces Faces Pain Scale: Hurts a little bit Pain Location: HA, frontal Pain Descriptors / Indicators: Headache Pain Intervention(s): Monitored during session    Home Living Family/patient expects to be discharged to:: Private residence Living Arrangements: Alone Available Help at Discharge: Family;Available PRN/intermittently Type of Home: Apartment Home Access: Level entry     Home Layout: One level Home Equipment: None Additional Comments: son lives nearby    Prior Function Level of Independence: Independent         Comments: drives, cooks, Health visitor Dominance   Dominant Hand: Left    Extremity/Trunk Assessment   Upper Extremity Assessment Upper Extremity Assessment: Overall WFL for tasks assessed    Lower Extremity Assessment Lower Extremity Assessment: Overall WFL for tasks assessed    Cervical / Trunk Assessment Cervical / Trunk Assessment: Normal  Communication   Communication: No difficulties  Cognition Arousal/Alertness: Awake/alert Behavior During Therapy: WFL for tasks assessed/performed Overall Cognitive Status: Within Functional Limits for tasks assessed                                        General Comments  General comments (skin integrity, edema, etc.): VSS throughout ambulation. Discussed sxs of CVA    Exercises     Assessment/Plan    PT Assessment Patent does not need any further PT services  PT Problem List         PT Treatment Interventions      PT Goals (Current goals can be found in the Care Plan section)  Acute Rehab PT Goals Patient Stated Goal: return home PT Goal Formulation: All assessment and education complete, DC therapy    Frequency     Barriers to discharge         Co-evaluation               AM-PAC PT "6 Clicks" Mobility  Outcome Measure Help needed turning from your back to your side while in a flat bed without using bedrails?: None Help needed moving from lying on your back to sitting on the side of a flat bed without using bedrails?: None Help needed moving to and from a bed to a chair (including a wheelchair)?: None Help needed standing up from a chair using your arms (e.g., wheelchair or bedside chair)?: None Help needed to walk in hospital room?: None Help needed climbing 3-5 steps with a railing? : None 6 Click Score: 24    End of Session Equipment Utilized During Treatment: Gait belt Activity Tolerance: Patient tolerated treatment well Patient left: in chair;with call bell/phone within reach;with family/visitor present Nurse Communication: Mobility status PT Visit Diagnosis: Unsteadiness on feet (R26.81)    Time: 1518-3437 PT Time Calculation (min) (ACUTE ONLY): 32 min   Charges:   PT Evaluation $PT Eval Moderate Complexity: 1 Mod PT Treatments $Gait Training: 8-22 mins        Leighton Roach, [place]  Pager [phone] Office [place] 05/23/2021, 3:57 PM

## 2021-05-23 NOTE — Progress Notes (Signed)
Report taken from [place], [place] ED staff. Patient arrived via stretcher to [phone]. Patient hooked up to ICU monitor. Patient is alert and oriented X4. Two PIV's on Right arm. Purewick in place. Room air. Patient has a patient belonging bag with clothes and her cell phone is within reach. Will continue to monitor patient.

## 2021-05-24 DIAGNOSIS — I4891 Unspecified atrial fibrillation: Secondary | ICD-10-CM

## 2021-05-24 LAB — GLUCOSE, CAPILLARY
Glucose-Capillary: 111 mg/dL — ABNORMAL HIGH (ref 70–99)
Glucose-Capillary: 131 mg/dL — ABNORMAL HIGH (ref 70–99)
Glucose-Capillary: 100 mg/dL — ABNORMAL HIGH (ref 70–99)

## 2021-05-24 MED ORDER — METOPROLOL TARTRATE 25 MG PO TABS: 25.0000 mg | ORAL_TABLET | Freq: Two times a day (BID) | ORAL | 0 refills | Status: DC

## 2021-05-24 MED ORDER — APIXABAN 5 MG PO TABS: 5.0000 mg | ORAL_TABLET | Freq: Two times a day (BID) | ORAL | 1 refills | Status: DC

## 2021-05-24 MED ORDER — ASPIRIN EC 81 MG PO TBEC
81.0000 mg | DELAYED_RELEASE_TABLET | Freq: Every day | ORAL | Status: DC
  Administered 2021-05-24: 81 mg via ORAL
  Filled 2021-05-24: qty 1

## 2021-05-24 MED ORDER — SACUBITRIL-VALSARTAN 24-26 MG PO TABS: 1.0000 | ORAL_TABLET | Freq: Two times a day (BID) | ORAL | 0 refills | Status: DC

## 2021-05-24 MED ORDER — INSULIN ASPART 100 UNIT/ML IJ SOLN: 0.0000 [IU] | Freq: Three times a day (TID) | INTRAMUSCULAR | Status: DC

## 2021-05-24 NOTE — Progress Notes (Signed)
OT Cancellation Note  Patient Details Name: Leah Day MRN: [id number] DOB: May 04, 1946   Cancelled Treatment:    Reason Eval/Treat Not Completed: OT screened, no needs identified, will sign off. R-sided weakness has resolved. Patient completing bed mobility, functional mobility community distances and ADLs including bathing/dressing and toileting with I this a.m. No acute OT needs identified.   Gloris Manchester OTR/L Supplemental OT, Department of rehab services [phone]  Sheppard Luckenbach R H. 05/24/2021, 9:41 AM

## 2021-05-24 NOTE — Discharge Summary (Addendum)
Stroke Discharge Summary  Patient ID: Leah Day   MRN: [id number]      DOB: November 30, 1945  Date of Admission: 05/22/2021 Date of Discharge: 05/24/2021  Attending Physician: Dr. Leonie Man Consultant(s):    cardiology Dr. Terri Skains Patient's PCP:  Nicholos Johns, MD  DISCHARGE DIAGNOSIS: Stroke like symptoms, Atrial Fibrillation Active Problems:   Stroke determined by clinical assessment [facility])   Stroke-like symptoms   Atrial fibrillation ([place])   Allergies as of 05/24/2021       Reactions   Cephalexin Rash      Atenolol Swelling   Irbesartan Cough   Lisinopril Cough        Medication List     STOP taking these medications    Tiadylt ER 120 MG 24 hr capsule Generic drug: diltiazem       TAKE these medications    albuterol 108 (90 Base) MCG/ACT inhaler Commonly known as: VENTOLIN HFA Inhale 2 puffs into the lungs as needed for wheezing or shortness of breath.   apixaban 5 MG Tabs tablet Commonly known as: ELIQUIS Take 1 tablet (5 mg total) by mouth 2 (two) times daily.   aspirin 81 MG EC tablet Take 81 mg by mouth daily.   esomeprazole 40 MG capsule Commonly known as: NEXIUM Take 40 mg by mouth daily.   famotidine 20 MG tablet Commonly known as: PEPCID Take 20 mg by mouth as needed for heartburn.   levothyroxine 125 MCG tablet Commonly known as: SYNTHROID Take 125 mcg by mouth daily.   loratadine 10 MG tablet Commonly known as: CLARITIN Take 10 mg by mouth daily.   metoprolol tartrate 25 MG tablet Commonly known as: LOPRESSOR Take 1 tablet (25 mg total) by mouth 2 (two) times daily.   montelukast 10 MG tablet Commonly known as: SINGULAIR Take 10 mg by mouth at bedtime.   rosuvastatin 10 MG tablet Commonly known as: CRESTOR Take 10 mg by mouth daily.   sacubitril-valsartan 24-26 MG Commonly known as: ENTRESTO Take 1 tablet by mouth 2 (two) times daily.   Symbicort 160-4.5 MCG/ACT inhaler Generic drug: budesonide-formoterol Inhale 2 puffs  into the lungs 2 (two) times daily.        LABORATORY STUDIES CBC    Component Value Date/Time   WBC 11.4 (H) 05/22/2021 1626   RBC 4.12 05/22/2021 1626   HGB 12.6 05/22/2021 1644   HCT 37.0 05/22/2021 1644   PLT 204 05/22/2021 1626   MCV 93.7 05/22/2021 1626   MCH 31.3 05/22/2021 1626   MCHC 33.4 05/22/2021 1626   RDW 12.8 05/22/2021 1626   LYMPHSABS 2.2 05/22/2021 1626   MONOABS 0.8 05/22/2021 1626   EOSABS 0.1 05/22/2021 1626   BASOSABS 0.1 05/22/2021 1626   CMP    Component Value Date/Time   NA 140 05/22/2021 1644   K 3.8 05/22/2021 1644   CL 105 05/22/2021 1644   CO2 24 05/22/2021 1626   GLUCOSE 115 (H) 05/22/2021 1644   BUN 21 05/22/2021 1644   CREATININE 1.10 (H) 05/22/2021 1644   CALCIUM 8.8 (L) 05/22/2021 1626   PROT 6.6 05/22/2021 1626   ALBUMIN 3.8 05/22/2021 1626   AST 20 05/22/2021 1626   ALT 22 05/22/2021 1626   ALKPHOS 74 05/22/2021 1626   BILITOT 0.8 05/22/2021 1626   GFRNONAA 50 (L) 05/22/2021 1626   COAGS Lab Results  Component Value Date   INR 1.0 05/22/2021   Lipid Panel    Component Value Date/Time   CHOL 143 05/23/2021  1914   TRIG 106 05/23/2021 1914   HDL 49 05/23/2021 1914   CHOLHDL 2.9 05/23/2021 1914   VLDL 21 05/23/2021 1914   LDLCALC 73 05/23/2021 1914   HgbA1C  Lab Results  Component Value Date   HGBA1C 5.6 05/23/2021   Urinalysis    Component Value Date/Time   COLORURINE STRAW (A) 05/22/2021 1835   APPEARANCEUR CLEAR 05/22/2021 1835   LABSPEC 1.005 05/22/2021 1835   PHURINE 7.0 05/22/2021 1835   GLUCOSEU NEGATIVE 05/22/2021 1835   HGBUR NEGATIVE 05/22/2021 1835   BILIRUBINUR NEGATIVE 05/22/2021 [place] 05/22/2021 1835   PROTEINUR NEGATIVE 05/22/2021 1835   NITRITE NEGATIVE 05/22/2021 1835   LEUKOCYTESUR NEGATIVE 05/22/2021 1835   Urine Drug Screen     Component Value Date/Time   LABOPIA NONE DETECTED 05/22/2021 1848   COCAINSCRNUR NONE DETECTED 05/22/2021 1848   LABBENZ NONE DETECTED  05/22/2021 1848   AMPHETMU NONE DETECTED 05/22/2021 1848   THCU NONE DETECTED 05/22/2021 1848   LABBARB NONE DETECTED 05/22/2021 1848    Alcohol Level    Component Value Date/Time   ETH <10 05/22/2021 1626     SIGNIFICANT DIAGNOSTIC STUDIES MR ANGIO HEAD WO CONTRAST  Result Date: 05/23/2021 CLINICAL DATA:  Stroke, follow-up 24 hours post tPA. Right-sided weakness. EXAM: MRI HEAD WITHOUT CONTRAST MRA HEAD WITHOUT CONTRAST MRA NECK WITHOUT AND WITH CONTRAST TECHNIQUE: Multiplanar, multiecho pulse sequences of the brain and surrounding structures were obtained without intravenous contrast. Angiographic images of the Circle of Willis were obtained using MRA technique without intravenous contrast. Angiographic images of the neck were obtained using MRA technique without and with intravenous contrast. Carotid stenosis measurements (when applicable) are obtained utilizing NASCET criteria, using the distal internal carotid diameter as the denominator. CONTRAST:  8.44mL GADAVIST GADOBUTROL 1 MMOL/ML IV SOLN COMPARISON:  CT head without contrast 05/22/2021 FINDINGS: MRI HEAD FINDINGS Brain: No acute infarct, hemorrhage, or mass lesion is present. The ventricles are of normal size. Diffuse periventricular and subcortical confluent T2 hyperintensities are moderately advanced for age. Basal ganglia are intact. No focal cortical abnormalities present. No significant extraaxial fluid collection is present. The internal auditory canals are within normal limits. White matter changes extend into the brainstem symmetrically. The cerebellum is within normal limits. Vascular: Flow is present in the major intracranial arteries. Skull and upper cervical spine: The craniocervical junction is normal. Upper cervical spine is within normal limits. Marrow signal is unremarkable. Sinuses/Orbits: Small polyp or mucous retention cyst is present right maxillary sinus. Paranasal sinuses and mastoid air cells are otherwise clear.  Bilateral lens replacements are noted. Globes and orbits are otherwise unremarkable. MRA HEAD FINDINGS Time-of-flight images demonstrate new significant flow disturbance in the internal carotid arteries from the high cervical segments through the ICA termini bilaterally. The A1 and M1 segments are within normal limits. The anterior communicating artery is patent. MCA bifurcations are unremarkable. The ACA and MCA branch vessels within limits bilaterally. PICA origins are below the field of view. Left vertebral artery is slightly dominant. Vertebrobasilar junction is normal. The basilar artery is normal. Both posterior cerebral arteries originate basilar tip. The PCA branch vessels are within normal limits bilaterally. MRA NECK FINDINGS Time-of-flight images demonstrate no significant flow disturbance at either carotid bifurcation. Flow is antegrade in the vertebral arteries bilaterally. Postcontrast images demonstrate a 3 vessel arch configuration mild narrowing at the proximal ICA bilaterally. More distal cervical ICA and intracranial ICA are normal bilaterally. Left vertebral artery is the dominant vessel. Both vertebral arteries originate  from the subclavian arteries without significant stenosis. There is no significant vertebral artery stenosis. IMPRESSION: 1. No acute intracranial abnormality. 2. Diffuse periventricular and subcortical confluent T2 hyperintensities bilaterally are moderately advanced for age. This likely reflects the sequela of chronic microvascular ischemia. 3. No significant proximal stenosis, aneurysm, or branch vessel occlusion within the Circle of Willis. 4. Mild narrowing of the proximal internal carotid arteries bilaterally due to a 3 vessel arch configuration. Electronically Signed   By: San Morelle M.D.   On: 05/23/2021 13:38   MR ANGIO NECK W WO CONTRAST  Result Date: 05/23/2021 CLINICAL DATA:  Stroke, follow-up 24 hours post tPA. Right-sided weakness. EXAM: MRI HEAD  WITHOUT CONTRAST MRA HEAD WITHOUT CONTRAST MRA NECK WITHOUT AND WITH CONTRAST TECHNIQUE: Multiplanar, multiecho pulse sequences of the brain and surrounding structures were obtained without intravenous contrast. Angiographic images of the Circle of Willis were obtained using MRA technique without intravenous contrast. Angiographic images of the neck were obtained using MRA technique without and with intravenous contrast. Carotid stenosis measurements (when applicable) are obtained utilizing NASCET criteria, using the distal internal carotid diameter as the denominator. CONTRAST:  8.8mL GADAVIST GADOBUTROL 1 MMOL/ML IV SOLN COMPARISON:  CT head without contrast 05/22/2021 FINDINGS: MRI HEAD FINDINGS Brain: No acute infarct, hemorrhage, or mass lesion is present. The ventricles are of normal size. Diffuse periventricular and subcortical confluent T2 hyperintensities are moderately advanced for age. Basal ganglia are intact. No focal cortical abnormalities present. No significant extraaxial fluid collection is present. The internal auditory canals are within normal limits. White matter changes extend into the brainstem symmetrically. The cerebellum is within normal limits. Vascular: Flow is present in the major intracranial arteries. Skull and upper cervical spine: The craniocervical junction is normal. Upper cervical spine is within normal limits. Marrow signal is unremarkable. Sinuses/Orbits: Small polyp or mucous retention cyst is present right maxillary sinus. Paranasal sinuses and mastoid air cells are otherwise clear. Bilateral lens replacements are noted. Globes and orbits are otherwise unremarkable. MRA HEAD FINDINGS Time-of-flight images demonstrate new significant flow disturbance in the internal carotid arteries from the high cervical segments through the ICA termini bilaterally. The A1 and M1 segments are within normal limits. The anterior communicating artery is patent. MCA bifurcations are unremarkable.  The ACA and MCA branch vessels within limits bilaterally. PICA origins are below the field of view. Left vertebral artery is slightly dominant. Vertebrobasilar junction is normal. The basilar artery is normal. Both posterior cerebral arteries originate basilar tip. The PCA branch vessels are within normal limits bilaterally. MRA NECK FINDINGS Time-of-flight images demonstrate no significant flow disturbance at either carotid bifurcation. Flow is antegrade in the vertebral arteries bilaterally. Postcontrast images demonstrate a 3 vessel arch configuration mild narrowing at the proximal ICA bilaterally. More distal cervical ICA and intracranial ICA are normal bilaterally. Left vertebral artery is the dominant vessel. Both vertebral arteries originate from the subclavian arteries without significant stenosis. There is no significant vertebral artery stenosis. IMPRESSION: 1. No acute intracranial abnormality. 2. Diffuse periventricular and subcortical confluent T2 hyperintensities bilaterally are moderately advanced for age. This likely reflects the sequela of chronic microvascular ischemia. 3. No significant proximal stenosis, aneurysm, or branch vessel occlusion within the Circle of Willis. 4. Mild narrowing of the proximal internal carotid arteries bilaterally due to a 3 vessel arch configuration. Electronically Signed   By: San Morelle M.D.   On: 05/23/2021 13:38   MR BRAIN WO CONTRAST  Result Date: 05/23/2021 CLINICAL DATA:  Stroke, follow-up 24 hours post tPA.  Right-sided weakness. EXAM: MRI HEAD WITHOUT CONTRAST MRA HEAD WITHOUT CONTRAST MRA NECK WITHOUT AND WITH CONTRAST TECHNIQUE: Multiplanar, multiecho pulse sequences of the brain and surrounding structures were obtained without intravenous contrast. Angiographic images of the Circle of Willis were obtained using MRA technique without intravenous contrast. Angiographic images of the neck were obtained using MRA technique without and with  intravenous contrast. Carotid stenosis measurements (when applicable) are obtained utilizing NASCET criteria, using the distal internal carotid diameter as the denominator. CONTRAST:  8.32mL GADAVIST GADOBUTROL 1 MMOL/ML IV SOLN COMPARISON:  CT head without contrast 05/22/2021 FINDINGS: MRI HEAD FINDINGS Brain: No acute infarct, hemorrhage, or mass lesion is present. The ventricles are of normal size. Diffuse periventricular and subcortical confluent T2 hyperintensities are moderately advanced for age. Basal ganglia are intact. No focal cortical abnormalities present. No significant extraaxial fluid collection is present. The internal auditory canals are within normal limits. White matter changes extend into the brainstem symmetrically. The cerebellum is within normal limits. Vascular: Flow is present in the major intracranial arteries. Skull and upper cervical spine: The craniocervical junction is normal. Upper cervical spine is within normal limits. Marrow signal is unremarkable. Sinuses/Orbits: Small polyp or mucous retention cyst is present right maxillary sinus. Paranasal sinuses and mastoid air cells are otherwise clear. Bilateral lens replacements are noted. Globes and orbits are otherwise unremarkable. MRA HEAD FINDINGS Time-of-flight images demonstrate new significant flow disturbance in the internal carotid arteries from the high cervical segments through the ICA termini bilaterally. The A1 and M1 segments are within normal limits. The anterior communicating artery is patent. MCA bifurcations are unremarkable. The ACA and MCA branch vessels within limits bilaterally. PICA origins are below the field of view. Left vertebral artery is slightly dominant. Vertebrobasilar junction is normal. The basilar artery is normal. Both posterior cerebral arteries originate basilar tip. The PCA branch vessels are within normal limits bilaterally. MRA NECK FINDINGS Time-of-flight images demonstrate no significant flow  disturbance at either carotid bifurcation. Flow is antegrade in the vertebral arteries bilaterally. Postcontrast images demonstrate a 3 vessel arch configuration mild narrowing at the proximal ICA bilaterally. More distal cervical ICA and intracranial ICA are normal bilaterally. Left vertebral artery is the dominant vessel. Both vertebral arteries originate from the subclavian arteries without significant stenosis. There is no significant vertebral artery stenosis. IMPRESSION: 1. No acute intracranial abnormality. 2. Diffuse periventricular and subcortical confluent T2 hyperintensities bilaterally are moderately advanced for age. This likely reflects the sequela of chronic microvascular ischemia. 3. No significant proximal stenosis, aneurysm, or branch vessel occlusion within the Circle of Willis. 4. Mild narrowing of the proximal internal carotid arteries bilaterally due to a 3 vessel arch configuration. Electronically Signed   By: San Morelle M.D.   On: 05/23/2021 13:38   ECHOCARDIOGRAM COMPLETE  Result Date: 05/23/2021    ECHOCARDIOGRAM REPORT   Patient Name:   Leah Day Date of Exam: 05/23/2021 Medical Rec #:  [id number]      Height:       62.0 in Accession #:    [id number]     Weight:       195.1 lb Date of Birth:  21-Dec-1945     BSA:          1.892 m Patient Age:    19 years       BP:           180/97 mmHg Patient Gender: F              HR:  86 bpm. Exam Location:  Inpatient Procedure: 2D Echo, 3D Echo, Cardiac Doppler, Color Doppler, Strain Analysis and            Intracardiac Opacification Agent Indications:     Stroke I63.9  History:         Patient has prior history of Echocardiogram examinations, most                  recent 07/13/2012. CAD, Prior CABG; Risk Factors:Hypertension and                  Dyslipidemia. GERD. History of breast cancer. Hypothyroidism.  Sonographer:     Darlina Sicilian RDCS Referring Phys:  [id number] Howell L BHAGAT Diagnosing Phys: Rex Kras DO IMPRESSIONS   1. Left ventricular ejection fraction, by estimation, is 30 to 35%. The left ventricle has moderately decreased function. The left ventricle demonstrates global hypokinesis. Left ventricular diastolic function could not be evaluated. The average left ventricular global longitudinal strain is -11.4 %. The global longitudinal strain is abnormal.  2. Right ventricular systolic function is moderately reduced. The right ventricular size is normal. Tricuspid regurgitation signal is inadequate for assessing PA pressure.  3. The mitral valve is degenerative. Mild mitral valve regurgitation. No evidence of mitral stenosis.  4. The aortic valve is grossly normal. Aortic valve regurgitation is not visualized. Mild aortic valve sclerosis is present, with no evidence of aortic valve stenosis.  5. The inferior vena cava is normal in size with greater than 50% respiratory variability, suggesting right atrial pressure of 3 mmHg. Comparison(s): No prior Echocardiogram. Conclusion(s)/Recommendation(s): No left ventricular mural or apical thrombus/thrombi. FINDINGS  Left Ventricle: Left ventricular ejection fraction, by estimation, is 30 to 35%. The left ventricle has moderately decreased function. The left ventricle demonstrates global hypokinesis. Definity contrast agent was given IV to delineate the left ventricular endocardial borders. The average left ventricular global longitudinal strain is -11.4 %. The global longitudinal strain is abnormal. The left ventricular internal cavity size was normal in size. There is no left ventricular hypertrophy. Left ventricular diastolic function could not be evaluated due to atrial fibrillation. Left ventricular diastolic function could not be evaluated. Right Ventricle: The right ventricular size is normal. No increase in right ventricular wall thickness. Right ventricular systolic function is moderately reduced. Tricuspid regurgitation signal is inadequate for assessing PA pressure. Left  Atrium: Left atrial size was normal in size. Right Atrium: Right atrial size was normal in size. Pericardium: There is no evidence of pericardial effusion. Mitral Valve: The mitral valve is degenerative in appearance. There is mild thickening of the mitral valve leaflet(s). There is mild calcification of the mitral valve leaflet(s). Normal mobility of the mitral valve leaflets. Mild mitral annular calcification. Mild mitral valve regurgitation. No evidence of mitral valve stenosis. Tricuspid Valve: The tricuspid valve is normal in structure. Tricuspid valve regurgitation is trivial. No evidence of tricuspid stenosis. Aortic Valve: The aortic valve is grossly normal. Aortic valve regurgitation is not visualized. Mild aortic valve sclerosis is present, with no evidence of aortic valve stenosis. Pulmonic Valve: The pulmonic valve was grossly normal. Pulmonic valve regurgitation is trivial. No evidence of pulmonic stenosis. Aorta: The aortic root and ascending aorta are structurally normal, with no evidence of dilitation. Venous: The inferior vena cava is normal in size with greater than 50% respiratory variability, suggesting right atrial pressure of 3 mmHg. IAS/Shunts: The atrial septum is grossly normal.  LEFT VENTRICLE PLAX 2D LVIDd:  4.60 cm      Diastology LVIDs:         3.90 cm      LV e' medial:    7.88 cm/s LV PW:         0.90 cm      LV E/e' medial:  15.9 LV IVS:        1.00 cm      LV e' lateral:   10.06 cm/s LVOT diam:     1.70 cm      LV E/e' lateral: 12.5 LV SV:         31 LV SV Index:   16           2D Longitudinal Strain LVOT Area:     2.27 cm     2D Strain GLS Avg:     -11.4 %  LV Volumes (MOD) LV vol d, MOD A2C: 121.0 ml LV vol d, MOD A4C: 116.0 ml LV vol s, MOD A2C: 93.6 ml LV vol s, MOD A4C: 62.3 ml LV SV MOD A2C:     27.4 ml LV SV MOD A4C:     116.0 ml LV SV MOD BP:      42.6 ml RIGHT VENTRICLE RV S prime:     5.77 cm/s TAPSE (M-mode): 1.0 cm LEFT ATRIUM             Index       RIGHT ATRIUM            Index LA diam:        4.60 cm 2.43 cm/m  RA Area:     16.30 cm LA Vol (A2C):   59.8 ml 31.61 ml/m RA Volume:   37.40 ml  19.77 ml/m LA Vol (A4C):   55.0 ml 29.07 ml/m LA Biplane Vol: 57.8 ml 30.55 ml/m  AORTIC VALVE LVOT Vmax:   79.57 cm/s LVOT Vmean:  50.733 cm/s LVOT VTI:    0.136 m  AORTA Ao Root diam: 2.80 cm Ao Asc diam:  2.70 cm MITRAL VALVE MV Area (PHT): 4.06 cm     SHUNTS MV Decel Time: 187 msec     Systemic VTI:  0.14 m MV E velocity: 125.67 cm/s  Systemic Diam: 1.70 cm Sunit Tolia DO Electronically signed by Rex Kras DO Signature Date/Time: 05/23/2021/2:22:26 PM    Final    CT HEAD CODE STROKE WO CONTRAST  Result Date: 05/22/2021 CLINICAL DATA:  Code stroke. Acute neuro deficit. Right-sided weakness. EXAM: CT HEAD WITHOUT CONTRAST TECHNIQUE: Contiguous axial images were obtained from the base of the skull through the vertex without intravenous contrast. COMPARISON:  None. FINDINGS: Brain: No evidence of acute infarction, hemorrhage, hydrocephalus, extra-axial collection or mass lesion/mass effect. Moderate white matter changes with diffuse white matter hypodensity. Vascular: Negative for hyperdense vessel Skull: Negative Sinuses/Orbits: Paranasal sinuses clear. Bilateral cataract extraction Other: None ASPECTS ([place] Stroke Program Early CT Score) - Ganglionic level infarction (caudate, lentiform nuclei, internal capsule, insula, M1-M3 cortex): 7 - Supraganglionic infarction (M4-M6 cortex): 3 Total score (0-10 with 10 being normal): 10 IMPRESSION: 1. No acute intracranial abnormality. Moderate white matter changes consistent with chronic microvascular ischemia 2. ASPECTS is 10 3. These results were called by telephone at the time of interpretation on 05/22/2021 at 5:03 pm to provider Bhagat, who verbally acknowledged these results. Electronically Signed   By: Franchot Gallo M.D.   On: 05/22/2021 17:03      HISTORY OF PRESENT ILLNESS  Leah Day is a 75 y.o. left  handed woman  with a past medical history significant for hypertension, hyperlipidemia, coronary artery disease s/p CABG (2013 per patient report), hypothyroidism, obesity, kidney stones, GERD, remote breast cancer s/p mastectomy (1994 per patient, managed surgically only without chemotherapy)   She reports she was in her normal state of health  when she had sudden onset right-sided weakness around 3 or 3:30 PM on 05/22/2021.  She denies any other symptoms including language difficulty, headache, neck pain, fevers, infectious symptoms.  tPA contraindications were reviewed via checklist and all contraindications were denied by the patient and on review of immediately available records.  Risks and benefits of tPA administration were reviewed and she agreed to receive the medication.  HOSPITAL COURSE  Stroke-like symptoms: not visualized on MRI s/p IV tPA treatment likely embolic in the setting of new onset of atrial fibrillation, not on anticoagulation. Code Stroke CT head: chronic microvascular ischemia. ASPECTS 10.    MRI  negative for acute infarct MRA  shows no significant large vessel stenosis or occlusion 2D Echo 30-35%, left ventricle has moderately decreased function, The left ventricle  demonstrates global hypokinesis LDL 73 HgbA1c 5.6 VTE prophylaxis - SCDs for now until post tPA obtained Diet: heart healthy aspirin 81 mg daily prior to admission, now on No antithrombotic. S/p tPA. She has new onset atrial fibrillation.  Cardiology consulted for anticoagulation recommendations Therapy recommendations: No PT/OT follow-up   Hypertension Home meds:  Tiadylt ER 120mg  daily (discontinued given the reduced LVEF) Stable Long-term BP goal normotensive   Hyperlipidemia Home meds:  crestor 10mg  daily, resumed in hospital LDL pending, goal < 70   Atrial Fibrillation New onset of atrial fibrillation noted on admission EKG Cardiology consulted Eliquis for A. fibrillation  Entresto for congestive heart  failure  Aspirin 81 mg for coronary artery disease and atherosclerosis  DISCHARGE EXAM Blood pressure (!) 158/93, pulse 67, temperature 97.9 F (36.6 C), temperature source Oral, resp. rate 20, height 5\' 2"  (1.575 m), weight 88.5 kg, SpO2 96 %.  PHYSICAL EXAM Pleasant elderly Caucasian lady not in distress. . Afebrile. Head is nontraumatic. Neck is supple without bruit.    Cardiac exam no murmur or gallop. Lungs are clear to auscultation. Distal pulses are well felt.  Neuro: Awake  Alert oriented x 3. Normal speech and language.eye movements full without nystagmus.fundi were not visualized. Vision acuity and fields appear normal. Hearing is normal. Palatal movements are normal. Face symmetric. Tongue midline. Normal strength, tone, reflexes and coordination. Normal sensation. Gait deferred.    Discharge Diet       Diet   Diet heart healthy/carb modified Room service appropriate? Yes; Fluid consistency: Thin   liquids  DISCHARGE PLAN Disposition:  home aspirin 81 mg daily for coronary artery disease and atherosclerosis Ongoing stroke risk factor control by Primary Care Physician at time of discharge Follow-up PCP Nicholos Johns, MD in 1 week  to evaluate her BMP, NT proBNP, and magnesium as that she has recently been started on Entresto.   Follow-up in [facility] in 4 weeks, office to schedule an appointment.  Follow-up with Cardiology, Dr. Terri Skains, office to schedule an appointment  45 minutes were spent preparing discharge.  Lissy Olivencia-Simmons, ACNP-BC Stroke NP  I have personally obtained history,examined this patient, reviewed notes, independently viewed imaging studies, participated in medical decision making and plan of care.ROS completed by me personally and pertinent positives fully documented  I have made any additions or clarifications directly to the above note. Agree with note above.  Antony Contras, MD Medical Director [facility]  Stroke Center Pager: [phone] 05/25/2021 12:38 PM

## 2021-05-24 NOTE — Progress Notes (Signed)
STROKE TEAM PROGRESS NOTE   INTERVAL HISTORY Patient is lying comfortably in bed.   She is ambulating in the room very well.  She has made full neurological recovery.  No focal deficits.  MRI scan of the brain is negative for acute infarct and MR angiogram shows no significant large vessel stenosis or occlusion.  Appreciate cardiology consult recommend Eliquis for A. fib and Entresto for congestive heart failure and aspirin 81 mg for coronary artery disease and atherosclerosis  Vitals:   05/24/21 0409 05/24/21 0804 05/24/21 0905 05/24/21 1206  BP: (!) 159/96 140/80  (!) 158/93  Pulse: (!) 104 85  67  Resp:  20  20  Temp:  97.9 F (36.6 C)  97.9 F (36.6 C)  TempSrc:  Oral  Oral  SpO2:  100% 100% 96%  Weight:      Height:       CBC:  Recent Labs  Lab 05/22/21 1626 05/22/21 1644  WBC 11.4*  --   NEUTROABS 8.2*  --   HGB 12.9 12.6  HCT 38.6 37.0  MCV 93.7  --   PLT 204  --    Basic Metabolic Panel:  Recent Labs  Lab 05/22/21 1626 05/22/21 1644  NA 137 140  K 3.7 3.8  CL 106 105  CO2 24  --   GLUCOSE 117* 115*  BUN 20 21  CREATININE 1.15* 1.10*  CALCIUM 8.8*  --    Lipid Panel:  Recent Labs  Lab 05/23/21 1914  CHOL 143  TRIG 106  HDL 49  CHOLHDL 2.9  VLDL 21  LDLCALC 73   HgbA1c:  Recent Labs  Lab 05/23/21 1914  HGBA1C 5.6   Urine Drug Screen:  Recent Labs  Lab 05/22/21 1848  LABOPIA NONE DETECTED  COCAINSCRNUR NONE DETECTED  LABBENZ NONE DETECTED  AMPHETMU NONE DETECTED  THCU NONE DETECTED  LABBARB NONE DETECTED    Alcohol Level  Recent Labs  Lab 05/22/21 1626  ETH <10    IMAGING past 24 hours CT HEAD CODE STROKE WO CONTRAST  Result Date: 05/22/2021  IMPRESSION: 1. No acute intracranial abnormality. Moderate white matter changes consistent with chronic microvascular ischemia 2. ASPECTS is 10   PHYSICAL EXAM Pleasant elderly Caucasian lady not in distress. . Afebrile. Head is nontraumatic. Neck is supple without bruit.    Cardiac exam  no murmur or gallop. Lungs are clear to auscultation. Distal pulses are well felt.  Neurological Exam ;  Awake  Alert oriented x 3. Normal speech and language.eye movements full without nystagmus.fundi were not visualized. Vision acuity and fields appear normal. Hearing is normal. Palatal movements are normal. Face symmetric. Tongue midline. Normal strength, tone, reflexes and coordination. Normal sensation. Gait deferred.  ASSESSMENT/PLAN [name] is a 75 y.o. female with history of hypertension, hyperlipidemia, coronary artery disease s/p CABG (2013 per patient report), hypothyroidism, obesity, kidney stones, GERD, remote breast cancer s/p mastectomy (1994) presenting with right sided weakness approximately.  She received tPA at 1709.  No large vessel occlusion seen on imagining.     Stroke: Small left lacunar stroke not visualized on MRI s/p IV tPA treatment likely embolic in the setting of new onset of atrial fibrillation, not on anticoagulation.  Code Stroke CT head: chronic microvascular ischemia. ASPECTS 10.    MRI  pending MRA  pending 2D Echo pending LDL No results found for requested labs within last 26280 hours. HgbA1c No results found for requested labs within last 26280 hours. VTE prophylaxis - SCDs for  now until post tPA obtained Diet: heart healthy aspirin 81 mg daily prior to admission, now on No antithrombotic. S/p tPA. She has new onset atrial fibrillation.  Cardiology consulted for anticoagulation recommendations Therapy recommendations:  pending Disposition:  pending  Hypertension Home meds:  Tiadylt ER 120mg  daily Stable BP goal < 180/105 for 24 hours then allow for permissive hypertension (OK if < 220/120) but gradually normalize in 5-7 days Long-term BP goal normotensive  Hyperlipidemia Home meds:  crestor 10mg  daily, resumed in hospital LDL pending, goal < 70 Continue statin at discharge  Atrial Fibrillation New onset of atrial fibrillation noted on  admission EKG Cardiology consulted Appreciate recs  Diabetes type II Controlled Home meds:  none HgbA1c pending goal < 7.0 CBGs Recent Labs    05/24/21 0314 05/24/21 0805 05/24/21 1207  GLUCAP 111* 131* 100*    SSI  Other Stroke Risk Factors Advanced Age >/= 30  Former Cigarette smoker, 1.5 packs per day for 30 years Obesity, Body mass index is 35.69 kg/m., BMI >/= 30 associated with increased stroke risk, recommend weight loss, diet and exercise as appropriate  Hx stroke/TIA Coronary artery disease: CABG 2013  Other Active Problems GERD: Nexium Hypothyroidism: Synthroid 125 mcg daily Breast cancer [facility] day # 2  Patient is doing well.  Continue mobilization out of bed.  Recommend Eliquis given new onset A. Fib and aspirin 81 mg daily in addition given underlying significant coronary artery disease and atherosclerosis.  Continue Entresto as per cardiology and outpatient follow-up with them in a few weeks and with me in the stroke clinic in 6 to 8 weeks.  Patient will be discharged later today.  Discussed with Dr. Terri Skains cardiology     Antony Contras, MD Medical Director [place] Pager: [phone] 05/24/2021 1:44 PM  To contact Stroke Continuity provider, please refer to [email or web address] After hours, contact General Neurology

## 2021-05-24 NOTE — Progress Notes (Signed)
SLP Cancellation Note  Patient Details Name: Leah Day MRN: [id number] DOB: 21-Aug-1946   Cancelled treatment:       Reason Eval/Treat Not Completed: SLP screened, no needs identified, will sign off. Patient to be discharged home this afternoon. She denies any speech or swallowing difficulties. MRI negative for acute infarct. No formal SLP evaluation warranted at this time. Thank you for this referral.  Sonia Baller, MA, [facility]

## 2021-05-24 NOTE — Progress Notes (Signed)
Progress Note  Patient Name: Leah Day Date of Encounter: 05/24/2021  Attending physician: Stroke, Md, MD Primary care provider: Nicholos Johns, MD Primary Cardiologist: NA Consultant:Dushawn Pusey Terri Skains, DO  Subjective: Leah Day is a 75 y.o. female who was seen and examined at bedside  No events overnight. Has tolerated the initiation of Lopressor as well as Entresto well without any side effects or intolerances. Remains in A. fib on telemetry with rate controlled Tentative plan to be discharged home later today Denies chest pain, shortness of breath, orthopnea, paroxysmal nocturnal dyspnea or lower extremity swelling.  Objective: Vital Signs in the last 24 hours: Temp:  [97.7 F (36.5 C)-97.9 F (36.6 C)] 97.9 F (36.6 C) (07/17 1206) Pulse Rate:  [67-110] 67 (07/17 1206) Resp:  [16-20] 20 (07/17 1206) BP: (124-159)/(60-107) 158/93 (07/17 1206) SpO2:  [95 %-100 %] 96 % (07/17 1206)  Intake/Output:  Intake/Output Summary (Last 24 hours) at 05/24/2021 1511 Last data filed at 05/24/2021 0846 Gross per 24 hour  Intake 900 ml  Output --  Net 900 ml    Net IO Since Admission: 360 mL [05/24/21 1511]  Weights:  Filed Weights   05/22/21 1621 05/23/21 0000  Weight: 86.2 kg 88.5 kg    Telemetry: Personally reviewed.  Rate controlled atrial fibrillation.  Physical examination: PHYSICAL EXAM: Vitals with BMI 05/24/2021 05/24/2021 05/24/2021  Height - - -  Weight - - -  BMI - - -  Systolic 270 623 762  Diastolic 93 80 96  Pulse 67 85 104   CONSTITUTIONAL: Age-appropriate female, well-developed and well-nourished. No acute distress. SKIN: Skin is warm and dry. No rash noted. No cyanosis. No pallor. No jaundice HEAD: Normocephalic and atraumatic. EYES: No scleral icterus MOUTH/THROAT: Moist oral membranes. NECK: No JVD present. No thyromegaly noted. No carotid bruits LYMPHATIC: No visible cervical adenopathy. CHEST Normal respiratory effort. No intercostal  retractions LUNGS: Clear to auscultation bilaterally.  No stridor. No wheezes. No rales. CARDIOVASCULAR: Irregularly irregular, positive G3-T5, soft holosystolic murmur heard at the apex, no gallops or rubs ABDOMINAL: Obese, soft, nontender, nondistended, positive bowel sounds in all 4 quadrants, no apparent ascites. EXTREMITIES: No peripheral edema HEMATOLOGIC: No significant bruising NEUROLOGIC: Oriented to person, place, and time. Nonfocal. Normal muscle tone. PSYCHIATRIC: Normal mood and affect. Normal behavior. Cooperative  Lab Results: Hematology Recent Labs  Lab 05/22/21 1626 05/22/21 1644  WBC 11.4*  --   RBC 4.12  --   HGB 12.9 12.6  HCT 38.6 37.0  MCV 93.7  --   MCH 31.3  --   MCHC 33.4  --   RDW 12.8  --   PLT 204  --     Chemistry Recent Labs  Lab 05/22/21 1626 05/22/21 1644  NA 137 140  K 3.7 3.8  CL 106 105  CO2 24  --   GLUCOSE 117* 115*  BUN 20 21  CREATININE 1.15* 1.10*  CALCIUM 8.8*  --   PROT 6.6  --   ALBUMIN 3.8  --   AST 20  --   ALT 22  --   ALKPHOS 74  --   BILITOT 0.8  --   GFRNONAA 50*  --   ANIONGAP 7  --      Cardiac Enzymes: Cardiac Panel (last 3 results) No results for input(s): CKTOTAL, CKMB, TROPONINIHS, RELINDX in the last 72 hours.  BNP (last 3 results) Recent Labs    05/23/21 1926  BNP 408.2*    ProBNP (last 3 results) No results for input(s): PROBNP  in the last 8760 hours.   DDimer No results for input(s): DDIMER in the last 168 hours.   Hemoglobin A1c:  Lab Results  Component Value Date   HGBA1C 5.6 05/23/2021   MPG 114.02 05/23/2021    TSH  Recent Labs    05/23/21 1914  TSH 0.834    Lipid Panel     Component Value Date/Time   CHOL 143 05/23/2021 1914   TRIG 106 05/23/2021 1914   HDL 49 05/23/2021 1914   CHOLHDL 2.9 05/23/2021 1914   VLDL 21 05/23/2021 1914   Gamaliel 73 05/23/2021 1914    Imaging: MR ANGIO HEAD WO CONTRAST  Result Date: 05/23/2021 CLINICAL DATA:  Stroke, follow-up 24  hours post tPA. Right-sided weakness. EXAM: MRI HEAD WITHOUT CONTRAST MRA HEAD WITHOUT CONTRAST MRA NECK WITHOUT AND WITH CONTRAST TECHNIQUE: Multiplanar, multiecho pulse sequences of the brain and surrounding structures were obtained without intravenous contrast. Angiographic images of the Circle of Willis were obtained using MRA technique without intravenous contrast. Angiographic images of the neck were obtained using MRA technique without and with intravenous contrast. Carotid stenosis measurements (when applicable) are obtained utilizing NASCET criteria, using the distal internal carotid diameter as the denominator. CONTRAST:  8.70mL GADAVIST GADOBUTROL 1 MMOL/ML IV SOLN COMPARISON:  CT head without contrast 05/22/2021 FINDINGS: MRI HEAD FINDINGS Brain: No acute infarct, hemorrhage, or mass lesion is present. The ventricles are of normal size. Diffuse periventricular and subcortical confluent T2 hyperintensities are moderately advanced for age. Basal ganglia are intact. No focal cortical abnormalities present. No significant extraaxial fluid collection is present. The internal auditory canals are within normal limits. White matter changes extend into the brainstem symmetrically. The cerebellum is within normal limits. Vascular: Flow is present in the major intracranial arteries. Skull and upper cervical spine: The craniocervical junction is normal. Upper cervical spine is within normal limits. Marrow signal is unremarkable. Sinuses/Orbits: Small polyp or mucous retention cyst is present right maxillary sinus. Paranasal sinuses and mastoid air cells are otherwise clear. Bilateral lens replacements are noted. Globes and orbits are otherwise unremarkable. MRA HEAD FINDINGS Time-of-flight images demonstrate new significant flow disturbance in the internal carotid arteries from the high cervical segments through the ICA termini bilaterally. The A1 and M1 segments are within normal limits. The anterior communicating  artery is patent. MCA bifurcations are unremarkable. The ACA and MCA branch vessels within limits bilaterally. PICA origins are below the field of view. Left vertebral artery is slightly dominant. Vertebrobasilar junction is normal. The basilar artery is normal. Both posterior cerebral arteries originate basilar tip. The PCA branch vessels are within normal limits bilaterally. MRA NECK FINDINGS Time-of-flight images demonstrate no significant flow disturbance at either carotid bifurcation. Flow is antegrade in the vertebral arteries bilaterally. Postcontrast images demonstrate a 3 vessel arch configuration mild narrowing at the proximal ICA bilaterally. More distal cervical ICA and intracranial ICA are normal bilaterally. Left vertebral artery is the dominant vessel. Both vertebral arteries originate from the subclavian arteries without significant stenosis. There is no significant vertebral artery stenosis. IMPRESSION: 1. No acute intracranial abnormality. 2. Diffuse periventricular and subcortical confluent T2 hyperintensities bilaterally are moderately advanced for age. This likely reflects the sequela of chronic microvascular ischemia. 3. No significant proximal stenosis, aneurysm, or branch vessel occlusion within the Circle of Willis. 4. Mild narrowing of the proximal internal carotid arteries bilaterally due to a 3 vessel arch configuration. Electronically Signed   By: San Morelle M.D.   On: 05/23/2021 13:38   MR ANGIO NECK  W WO CONTRAST  Result Date: 05/23/2021 CLINICAL DATA:  Stroke, follow-up 24 hours post tPA. Right-sided weakness. EXAM: MRI HEAD WITHOUT CONTRAST MRA HEAD WITHOUT CONTRAST MRA NECK WITHOUT AND WITH CONTRAST TECHNIQUE: Multiplanar, multiecho pulse sequences of the brain and surrounding structures were obtained without intravenous contrast. Angiographic images of the Circle of Willis were obtained using MRA technique without intravenous contrast. Angiographic images of the neck  were obtained using MRA technique without and with intravenous contrast. Carotid stenosis measurements (when applicable) are obtained utilizing NASCET criteria, using the distal internal carotid diameter as the denominator. CONTRAST:  8.71mL GADAVIST GADOBUTROL 1 MMOL/ML IV SOLN COMPARISON:  CT head without contrast 05/22/2021 FINDINGS: MRI HEAD FINDINGS Brain: No acute infarct, hemorrhage, or mass lesion is present. The ventricles are of normal size. Diffuse periventricular and subcortical confluent T2 hyperintensities are moderately advanced for age. Basal ganglia are intact. No focal cortical abnormalities present. No significant extraaxial fluid collection is present. The internal auditory canals are within normal limits. White matter changes extend into the brainstem symmetrically. The cerebellum is within normal limits. Vascular: Flow is present in the major intracranial arteries. Skull and upper cervical spine: The craniocervical junction is normal. Upper cervical spine is within normal limits. Marrow signal is unremarkable. Sinuses/Orbits: Small polyp or mucous retention cyst is present right maxillary sinus. Paranasal sinuses and mastoid air cells are otherwise clear. Bilateral lens replacements are noted. Globes and orbits are otherwise unremarkable. MRA HEAD FINDINGS Time-of-flight images demonstrate new significant flow disturbance in the internal carotid arteries from the high cervical segments through the ICA termini bilaterally. The A1 and M1 segments are within normal limits. The anterior communicating artery is patent. MCA bifurcations are unremarkable. The ACA and MCA branch vessels within limits bilaterally. PICA origins are below the field of view. Left vertebral artery is slightly dominant. Vertebrobasilar junction is normal. The basilar artery is normal. Both posterior cerebral arteries originate basilar tip. The PCA branch vessels are within normal limits bilaterally. MRA NECK FINDINGS  Time-of-flight images demonstrate no significant flow disturbance at either carotid bifurcation. Flow is antegrade in the vertebral arteries bilaterally. Postcontrast images demonstrate a 3 vessel arch configuration mild narrowing at the proximal ICA bilaterally. More distal cervical ICA and intracranial ICA are normal bilaterally. Left vertebral artery is the dominant vessel. Both vertebral arteries originate from the subclavian arteries without significant stenosis. There is no significant vertebral artery stenosis. IMPRESSION: 1. No acute intracranial abnormality. 2. Diffuse periventricular and subcortical confluent T2 hyperintensities bilaterally are moderately advanced for age. This likely reflects the sequela of chronic microvascular ischemia. 3. No significant proximal stenosis, aneurysm, or branch vessel occlusion within the Circle of Willis. 4. Mild narrowing of the proximal internal carotid arteries bilaterally due to a 3 vessel arch configuration. Electronically Signed   By: San Morelle M.D.   On: 05/23/2021 13:38   MR BRAIN WO CONTRAST  Result Date: 05/23/2021 CLINICAL DATA:  Stroke, follow-up 24 hours post tPA. Right-sided weakness. EXAM: MRI HEAD WITHOUT CONTRAST MRA HEAD WITHOUT CONTRAST MRA NECK WITHOUT AND WITH CONTRAST TECHNIQUE: Multiplanar, multiecho pulse sequences of the brain and surrounding structures were obtained without intravenous contrast. Angiographic images of the Circle of Willis were obtained using MRA technique without intravenous contrast. Angiographic images of the neck were obtained using MRA technique without and with intravenous contrast. Carotid stenosis measurements (when applicable) are obtained utilizing NASCET criteria, using the distal internal carotid diameter as the denominator. CONTRAST:  8.71mL GADAVIST GADOBUTROL 1 MMOL/ML IV SOLN COMPARISON:  CT  head without contrast 05/22/2021 FINDINGS: MRI HEAD FINDINGS Brain: No acute infarct, hemorrhage, or mass  lesion is present. The ventricles are of normal size. Diffuse periventricular and subcortical confluent T2 hyperintensities are moderately advanced for age. Basal ganglia are intact. No focal cortical abnormalities present. No significant extraaxial fluid collection is present. The internal auditory canals are within normal limits. White matter changes extend into the brainstem symmetrically. The cerebellum is within normal limits. Vascular: Flow is present in the major intracranial arteries. Skull and upper cervical spine: The craniocervical junction is normal. Upper cervical spine is within normal limits. Marrow signal is unremarkable. Sinuses/Orbits: Small polyp or mucous retention cyst is present right maxillary sinus. Paranasal sinuses and mastoid air cells are otherwise clear. Bilateral lens replacements are noted. Globes and orbits are otherwise unremarkable. MRA HEAD FINDINGS Time-of-flight images demonstrate new significant flow disturbance in the internal carotid arteries from the high cervical segments through the ICA termini bilaterally. The A1 and M1 segments are within normal limits. The anterior communicating artery is patent. MCA bifurcations are unremarkable. The ACA and MCA branch vessels within limits bilaterally. PICA origins are below the field of view. Left vertebral artery is slightly dominant. Vertebrobasilar junction is normal. The basilar artery is normal. Both posterior cerebral arteries originate basilar tip. The PCA branch vessels are within normal limits bilaterally. MRA NECK FINDINGS Time-of-flight images demonstrate no significant flow disturbance at either carotid bifurcation. Flow is antegrade in the vertebral arteries bilaterally. Postcontrast images demonstrate a 3 vessel arch configuration mild narrowing at the proximal ICA bilaterally. More distal cervical ICA and intracranial ICA are normal bilaterally. Left vertebral artery is the dominant vessel. Both vertebral arteries  originate from the subclavian arteries without significant stenosis. There is no significant vertebral artery stenosis. IMPRESSION: 1. No acute intracranial abnormality. 2. Diffuse periventricular and subcortical confluent T2 hyperintensities bilaterally are moderately advanced for age. This likely reflects the sequela of chronic microvascular ischemia. 3. No significant proximal stenosis, aneurysm, or branch vessel occlusion within the Circle of Willis. 4. Mild narrowing of the proximal internal carotid arteries bilaterally due to a 3 vessel arch configuration. Electronically Signed   By: San Morelle M.D.   On: 05/23/2021 13:38   ECHOCARDIOGRAM COMPLETE  Result Date: 05/23/2021    ECHOCARDIOGRAM REPORT   Patient Name:   SAARAH DEWING Date of Exam: 05/23/2021 Medical Rec #:  [id number]      Height:       62.0 in Accession #:    [id number]     Weight:       195.1 lb Date of Birth:  February 18, 1946     BSA:          1.892 m Patient Age:    7 years       BP:           180/97 mmHg Patient Gender: F              HR:           86 bpm. Exam Location:  Inpatient Procedure: 2D Echo, 3D Echo, Cardiac Doppler, Color Doppler, Strain Analysis and            Intracardiac Opacification Agent Indications:     Stroke I63.9  History:         Patient has prior history of Echocardiogram examinations, most                  recent 07/13/2012. CAD, Prior CABG; Risk Factors:Hypertension and  Dyslipidemia. GERD. History of breast cancer. Hypothyroidism.  Sonographer:     Darlina Sicilian RDCS Referring Phys:  [id number] [place] L BHAGAT Diagnosing Phys: Rex Kras DO IMPRESSIONS  1. Left ventricular ejection fraction, by estimation, is 30 to 35%. The left ventricle has moderately decreased function. The left ventricle demonstrates global hypokinesis. Left ventricular diastolic function could not be evaluated. The average left ventricular global longitudinal strain is -11.4 %. The global longitudinal strain is abnormal.   2. Right ventricular systolic function is moderately reduced. The right ventricular size is normal. Tricuspid regurgitation signal is inadequate for assessing PA pressure.  3. The mitral valve is degenerative. Mild mitral valve regurgitation. No evidence of mitral stenosis.  4. The aortic valve is grossly normal. Aortic valve regurgitation is not visualized. Mild aortic valve sclerosis is present, with no evidence of aortic valve stenosis.  5. The inferior vena cava is normal in size with greater than 50% respiratory variability, suggesting right atrial pressure of 3 mmHg. Comparison(s): No prior Echocardiogram. Conclusion(s)/Recommendation(s): No left ventricular mural or apical thrombus/thrombi. FINDINGS  Left Ventricle: Left ventricular ejection fraction, by estimation, is 30 to 35%. The left ventricle has moderately decreased function. The left ventricle demonstrates global hypokinesis. Definity contrast agent was given IV to delineate the left ventricular endocardial borders. The average left ventricular global longitudinal strain is -11.4 %. The global longitudinal strain is abnormal. The left ventricular internal cavity size was normal in size. There is no left ventricular hypertrophy. Left ventricular diastolic function could not be evaluated due to atrial fibrillation. Left ventricular diastolic function could not be evaluated. Right Ventricle: The right ventricular size is normal. No increase in right ventricular wall thickness. Right ventricular systolic function is moderately reduced. Tricuspid regurgitation signal is inadequate for assessing PA pressure. Left Atrium: Left atrial size was normal in size. Right Atrium: Right atrial size was normal in size. Pericardium: There is no evidence of pericardial effusion. Mitral Valve: The mitral valve is degenerative in appearance. There is mild thickening of the mitral valve leaflet(s). There is mild calcification of the mitral valve leaflet(s). Normal mobility  of the mitral valve leaflets. Mild mitral annular calcification. Mild mitral valve regurgitation. No evidence of mitral valve stenosis. Tricuspid Valve: The tricuspid valve is normal in structure. Tricuspid valve regurgitation is trivial. No evidence of tricuspid stenosis. Aortic Valve: The aortic valve is grossly normal. Aortic valve regurgitation is not visualized. Mild aortic valve sclerosis is present, with no evidence of aortic valve stenosis. Pulmonic Valve: The pulmonic valve was grossly normal. Pulmonic valve regurgitation is trivial. No evidence of pulmonic stenosis. Aorta: The aortic root and ascending aorta are structurally normal, with no evidence of dilitation. Venous: The inferior vena cava is normal in size with greater than 50% respiratory variability, suggesting right atrial pressure of 3 mmHg. IAS/Shunts: The atrial septum is grossly normal.  LEFT VENTRICLE PLAX 2D LVIDd:         4.60 cm      Diastology LVIDs:         3.90 cm      LV e' medial:    7.88 cm/s LV PW:         0.90 cm      LV E/e' medial:  15.9 LV IVS:        1.00 cm      LV e' lateral:   10.06 cm/s LVOT diam:     1.70 cm      LV E/e' lateral: 12.5 LV SV:  31 LV SV Index:   16           2D Longitudinal Strain LVOT Area:     2.27 cm     2D Strain GLS Avg:     -11.4 %  LV Volumes (MOD) LV vol d, MOD A2C: 121.0 ml LV vol d, MOD A4C: 116.0 ml LV vol s, MOD A2C: 93.6 ml LV vol s, MOD A4C: 62.3 ml LV SV MOD A2C:     27.4 ml LV SV MOD A4C:     116.0 ml LV SV MOD BP:      42.6 ml RIGHT VENTRICLE RV S prime:     5.77 cm/s TAPSE (M-mode): 1.0 cm LEFT ATRIUM             Index       RIGHT ATRIUM           Index LA diam:        4.60 cm 2.43 cm/m  RA Area:     16.30 cm LA Vol (A2C):   59.8 ml 31.61 ml/m RA Volume:   37.40 ml  19.77 ml/m LA Vol (A4C):   55.0 ml 29.07 ml/m LA Biplane Vol: 57.8 ml 30.55 ml/m  AORTIC VALVE LVOT Vmax:   79.57 cm/s LVOT Vmean:  50.733 cm/s LVOT VTI:    0.136 m  AORTA Ao Root diam: 2.80 cm Ao Asc diam:  2.70  cm MITRAL VALVE MV Area (PHT): 4.06 cm     SHUNTS MV Decel Time: 187 msec     Systemic VTI:  0.14 m MV E velocity: 125.67 cm/s  Systemic Diam: 1.70 cm Astha Probasco DO Electronically signed by Rex Kras DO Signature Date/Time: 05/23/2021/2:22:26 PM    Final    CT HEAD CODE STROKE WO CONTRAST  Result Date: 05/22/2021 CLINICAL DATA:  Code stroke. Acute neuro deficit. Right-sided weakness. EXAM: CT HEAD WITHOUT CONTRAST TECHNIQUE: Contiguous axial images were obtained from the base of the skull through the vertex without intravenous contrast. COMPARISON:  None. FINDINGS: Brain: No evidence of acute infarction, hemorrhage, hydrocephalus, extra-axial collection or mass lesion/mass effect. Moderate white matter changes with diffuse white matter hypodensity. Vascular: Negative for hyperdense vessel Skull: Negative Sinuses/Orbits: Paranasal sinuses clear. Bilateral cataract extraction Other: None ASPECTS ([place] Stroke Program Early CT Score) - Ganglionic level infarction (caudate, lentiform nuclei, internal capsule, insula, M1-M3 cortex): 7 - Supraganglionic infarction (M4-M6 cortex): 3 Total score (0-10 with 10 being normal): 10 IMPRESSION: 1. No acute intracranial abnormality. Moderate white matter changes consistent with chronic microvascular ischemia 2. ASPECTS is 10 3. These results were called by telephone at the time of interpretation on 05/22/2021 at 5:03 pm to provider Bhagat, who verbally acknowledged these results. Electronically Signed   By: Franchot Gallo M.D.   On: 05/22/2021 17:03    Cardiac database: EKG: 05/22/2021: Atrial fibrillation, controlled ventricular rate, 77 bpm, rare PVCs, without underlying ischemia or injury pattern.   Echocardiogram: 05/23/2021: LVEF 30-35%, global hypokinesis, average GLS -11.4%, moderately reduced right ventricular function, normal right ventricular size, mild MR, aortic valve sclerosis without stenosis, no LV thrombus.  Scheduled Meds:   stroke: mapping our  early stages of recovery book   Does not apply Once   apixaban  5 mg Oral BID   aspirin EC  81 mg Oral Daily   Chlorhexidine Gluconate Cloth  6 each Topical Q0600   hydrALAZINE  5 mg Intravenous Once   insulin aspart  0-15 Units Subcutaneous TID WC & HS  levothyroxine  125 mcg Oral Q0600   metoprolol tartrate  25 mg Oral BID   mometasone-formoterol  2 puff Inhalation BID   montelukast  10 mg Oral QHS   pantoprazole (PROTONIX) IV  40 mg Intravenous QHS   rosuvastatin  10 mg Oral Daily   sacubitril-valsartan  1 tablet Oral BID    Continuous Infusions:   PRN Meds: acetaminophen **OR** acetaminophen (TYLENOL) oral liquid 160 mg/5 mL **OR** acetaminophen, albuterol, hydrALAZINE, senna-docusate   IMPRESSION & RECOMMENDATIONS: Nikki Glanzer is a 75 y.o. female whose past medical history and cardiac risk factors include: Established coronary artery disease status post three-vessel CABG (2013), hypertension, hyperlipidemia, history of left-sided breast cancer status postmastectomy, former smoker, postmenopausal female, advanced age, obesity due to excess calories.  Atrial fibrillation controlled ventricular rate: Chronicity unknown, newly discovered July 2022 Rate control: Lopressor Rhythm control: N/A Thromboembolic prophylaxis: Shared decision between the patient and her son was to initiate Eliquis.   Risks, benefits, and alternatives to oral anticoagulation discussed.     Long-term oral anticoagulation: Indication: Atrial fibrillation CHA2DS2-VASc SCORE is 6 which correlates to 9.8% risk of stroke per year. Risks, benefits, and alternatives to oral anticoagulation reiterated to the patient at today's encounter.   Fall precautions reviewed.   Patient is asked to have her hemoglobin and hematocrit checked every 6 months to check for anemia.     Left lacunar stroke: Present on admission status post tPA 05/22/2021 Currently managed by neurology MRI/MRA performed, results reviewed.    Cardiomyopathy, suspect ischemic etiology Status post CABG, three-vessel, 2013 at [facility] Since 2013 patient states that she has not followed up with cardiology. She is overall euvolemic and not in congestive heart failure during this hospitalization. Given the reduced LVEF discontinued diltiazem Continue Lopressor 25 mg p.o. twice daily  Continue Entresto 24/26 mg p.o. twice daily.   No side effects of either Lopressor or Entresto.   Patient lives in [place] so she is not sure if she will follow-up with [place] [facility] post discharge for longitudinal care.  However, she has been educated on being referred to a cardiologist closer to home given her cardiovascular conditions.   In the meantime I have asked her to reach out to her primary care provider in 1 week to evaluate her BMP, NT proBNP, and magnesium as that she has recently been started on Entresto.   I will have the office reach out to her to make a follow-up appointment and depart has been updated.  Will uptitrate GDMT in a stepwise fashion given her recent stroke.   Established coronary artery disease with prior history of PCI followed by three-vessel CABG without angina pectoris: Neurology has cleared her for aspirin 81 mg p.o. daily.   Fasting lipid profile reviewed. Recommend statin therapy Echocardiogram results reviewed and noted above Would recommend stress test as an outpatient basis given her LVEF and no recent ischemic evaluation.  Patient is agreeable to either follow-up with our practice or one closer to her home status post discharge.   Other: Benign essential hypertension: Currently being managed by primary team given her recent stroke.  Hyperlipidemia: Fasting lipid profile reviewed, recommended goal LDL less than 70 mg/dL..  Currently on statin therapy.  Former smoker: Educated on the importance of complete smoking cessation.  Plan of care discussed with attending physician Dr. Antony Contras.    Patient's questions and concerns were addressed to her satisfaction. She voices understanding of the instructions provided during this encounter.   This note was created  using a voice recognition software as a result there may be grammatical errors inadvertently enclosed that do not reflect the nature of this encounter. Every attempt is made to correct such errors.  Rex Kras, DO, [facility]. [place] Office: [phone] 05/24/2021, 3:11 PM

## 2021-05-24 NOTE — Progress Notes (Signed)
Patient ready for discharge to home; discharge instructions given and reviewed; Rx's given; patient discharged via wheelchair accompanied home by her son. Stroke education completely.

## 2021-05-24 NOTE — Progress Notes (Signed)
Patient c/o dizziness when moving up out of the bed. Orthostatic vital obtained and informed MD. MD stated to continue to monitor.

## 2021-05-24 NOTE — Care Management (Signed)
Provided with Eliquis 30 day card

## 2021-05-26 ENCOUNTER — Other Ambulatory Visit: Payer: Self-pay

## 2021-05-26 ENCOUNTER — Telehealth: Payer: Self-pay

## 2021-05-26 DIAGNOSIS — I2510 Atherosclerotic heart disease of native coronary artery without angina pectoris: Secondary | ICD-10-CM

## 2021-05-26 NOTE — Telephone Encounter (Signed)
Location of hospitalization: [facility]  Reason for hospitalization: Patient thought she was having a stroke.  Date of discharge: 05/24/2021 Date of first communication with patient: today Person contacting patient: Michail Sermon Current symptoms: Patient states she feels "a little dizzy"  Do you understand why you were in the Hospital: Yes Questions regarding discharge instructions: None Where were you discharged to: Home Medications reviewed: Yes Allergies reviewed: Yes Dietary changes reviewed: Yes. Discussed low fat and low salt diet.  Referals reviewed: NA Activities of Daily Living: None  Any transportation issues/concerns: None Any patient concerns: None Confirmed importance & date/time of Follow up appt: Yes Confirmed with patient if condition begins to worsen call. Pt was given the office number and encouraged to call back with questions or concerns: Yes

## 2021-05-27 NOTE — Telephone Encounter (Signed)
Please order BMP, NTprobBNP, and Mg prior to next office visit.  Please release the orders as well.

## 2021-05-27 NOTE — Telephone Encounter (Signed)
Done I also told patient yesterday to make sure to go get those done

## 2021-05-29 DIAGNOSIS — I2510 Atherosclerotic heart disease of native coronary artery without angina pectoris: Secondary | ICD-10-CM | POA: Diagnosis not present

## 2021-05-30 LAB — HEMOGLOBIN AND HEMATOCRIT, BLOOD
Hematocrit: 38.6 % (ref 34.0–46.6)
Hemoglobin: 13.3 g/dL (ref 11.1–15.9)

## 2021-05-30 LAB — PRO B NATRIURETIC PEPTIDE: NT-Pro BNP: 2074 pg/mL — ABNORMAL HIGH (ref 0–301)

## 2021-05-30 LAB — BASIC METABOLIC PANEL
BUN/Creatinine Ratio: 19 (ref 12–28)
BUN: 20 mg/dL (ref 8–27)
CO2: 21 mmol/L (ref 20–29)
Calcium: 9.1 mg/dL (ref 8.7–10.3)
Chloride: 103 mmol/L (ref 96–106)
Creatinine, Ser: 1.06 mg/dL — ABNORMAL HIGH (ref 0.57–1.00)
Glucose: 95 mg/dL (ref 65–99)
Potassium: 4.1 mmol/L (ref 3.5–5.2)
Sodium: 139 mmol/L (ref 134–144)
eGFR: 55 mL/min/{1.73_m2} — ABNORMAL LOW (ref >59)

## 2021-06-01 DIAGNOSIS — E785 Hyperlipidemia, unspecified: Secondary | ICD-10-CM | POA: Diagnosis not present

## 2021-06-01 DIAGNOSIS — Z09 Encounter for follow-up examination after completed treatment for conditions other than malignant neoplasm: Secondary | ICD-10-CM | POA: Diagnosis not present

## 2021-06-01 DIAGNOSIS — Z6834 Body mass index (BMI) 34.0-34.9, adult: Secondary | ICD-10-CM | POA: Diagnosis not present

## 2021-06-01 DIAGNOSIS — Z79899 Other long term (current) drug therapy: Secondary | ICD-10-CM | POA: Diagnosis not present

## 2021-06-01 DIAGNOSIS — Z8673 Personal history of transient ischemic attack (TIA), and cerebral infarction without residual deficits: Secondary | ICD-10-CM | POA: Diagnosis not present

## 2021-06-04 ENCOUNTER — Ambulatory Visit: Payer: Medicare HMO | Admitting: Cardiology

## 2021-06-04 ENCOUNTER — Encounter: Payer: Self-pay | Admitting: Cardiology

## 2021-06-04 ENCOUNTER — Other Ambulatory Visit: Payer: Self-pay

## 2021-06-04 VITALS — BP 141/89 | HR 86 | Temp 98.0°F | Resp 16 | Ht 62.0 in | Wt 194.8 lb

## 2021-06-04 DIAGNOSIS — E782 Mixed hyperlipidemia: Secondary | ICD-10-CM | POA: Diagnosis not present

## 2021-06-04 DIAGNOSIS — I5022 Chronic systolic (congestive) heart failure: Secondary | ICD-10-CM

## 2021-06-04 DIAGNOSIS — Z7901 Long term (current) use of anticoagulants: Secondary | ICD-10-CM

## 2021-06-04 DIAGNOSIS — I6381 Other cerebral infarction due to occlusion or stenosis of small artery: Secondary | ICD-10-CM | POA: Diagnosis not present

## 2021-06-04 DIAGNOSIS — Z87891 Personal history of nicotine dependence: Secondary | ICD-10-CM

## 2021-06-04 DIAGNOSIS — I2510 Atherosclerotic heart disease of native coronary artery without angina pectoris: Secondary | ICD-10-CM | POA: Diagnosis not present

## 2021-06-04 DIAGNOSIS — I4819 Other persistent atrial fibrillation: Secondary | ICD-10-CM | POA: Diagnosis not present

## 2021-06-04 DIAGNOSIS — I429 Cardiomyopathy, unspecified: Secondary | ICD-10-CM

## 2021-06-04 DIAGNOSIS — I10 Essential (primary) hypertension: Secondary | ICD-10-CM

## 2021-06-04 DIAGNOSIS — Z951 Presence of aortocoronary bypass graft: Secondary | ICD-10-CM

## 2021-06-04 DIAGNOSIS — C50912 Malignant neoplasm of unspecified site of left female breast: Secondary | ICD-10-CM

## 2021-06-04 MED ORDER — ENTRESTO 49-51 MG PO TABS: 1.0000 | ORAL_TABLET | Freq: Two times a day (BID) | ORAL | 0 refills | Status: DC

## 2021-06-04 NOTE — Progress Notes (Signed)
Date:  06/04/2021   ID:  Leah Day, DOB 1946-05-07, MRN [id number]  PCP:  Nicholos Johns, MD  Cardiologist:  Rex Kras, DO, [facility] (established care 05/23/2021)  Date: 06/04/21 Last Visit: 05/24/2021 (during her last hospitalization)  Chief Complaint  Patient presents with   Hospitalization Follow-up   Atrial Fibrillation   New Patient (Initial Visit)    HPI  Leah Day is a 75 y.o. female who presents to the office with a chief complaint of " posthospitalization follow-up, management of cardiomyopathy and atrial fibrillation." Patient's past medical history and cardiovascular risk factors include: Persistent atrial fibrillation (chronicity unknown), established coronary artery disease status post three-vessel CABG (2013), hypertension, hyperlipidemia, history of left-sided breast cancer status postmastectomy, former smoker, postmenopausal female, advanced age, obesity due to excess calories.  Initially seen in the hospital during her July 2022 admission for atrial fibrillation and cardiomyopathy management.  Patient presented to the hospital with symptoms of right arm and leg heaviness and shortly thereafter received tPA which she tolerated well without any side effects and the following day was back to baseline.  She was noted to be in atrial fibrillation when she came to the hospital and therefore cardiology was consulted for further evaluation and management.  Focus on rate control strategy with regards to atrial fibrillation management and once cleared by neurology was started on Eliquis for thromboembolic prophylaxis.  In the interim had an echocardiogram which noted moderately reduced LVEF and given her history of CAD status post CABG in 2013 suspect that is most likely ischemic in etiology.  Patient states that after her 2013 CABG she has not followed up with cardiology until her last hospitalization in July 2022.  She now presents for post hospitalization follow-up for  management of atrial fibrillation and cardiomyopathy.  Patient states that her shortness of breath has improved significantly but still present with over exertion.  Denies orthopnea, paroxysmal nocturnal dyspnea and lower extremity swelling.  She denies angina pectoralis.  ALLERGIES: Allergies  Allergen Reactions   Cephalexin Rash        Atenolol Swelling   Irbesartan Cough   Lisinopril Cough    MEDICATION LIST PRIOR TO VISIT: Current Meds  Medication Sig   albuterol (VENTOLIN HFA) 108 (90 Base) MCG/ACT inhaler Inhale 2 puffs into the lungs as needed for wheezing or shortness of breath.   apixaban (ELIQUIS) 5 MG TABS tablet Take 1 tablet (5 mg total) by mouth 2 (two) times daily.   aspirin 81 MG EC tablet Take 81 mg by mouth daily.   esomeprazole (NEXIUM) 40 MG capsule Take 40 mg by mouth daily.   famotidine (PEPCID) 20 MG tablet Take 20 mg by mouth as needed for heartburn.   levothyroxine (SYNTHROID) 125 MCG tablet Take 125 mcg by mouth daily.   loratadine (CLARITIN) 10 MG tablet Take 10 mg by mouth daily.   metoprolol tartrate (LOPRESSOR) 25 MG tablet Take 1 tablet (25 mg total) by mouth 2 (two) times daily.   montelukast (SINGULAIR) 10 MG tablet Take 10 mg by mouth at bedtime.   rosuvastatin (CRESTOR) 10 MG tablet Take 10 mg by mouth daily.   sacubitril-valsartan (ENTRESTO) 49-51 MG Take 1 tablet by mouth 2 (two) times daily.   SYMBICORT 160-4.5 MCG/ACT inhaler Inhale 2 puffs into the lungs 2 (two) times daily.   [DISCONTINUED] sacubitril-valsartan (ENTRESTO) 24-26 MG Take 1 tablet by mouth 2 (two) times daily.     PAST MEDICAL HISTORY: Coronary artery disease status post CABG 2013 at [facility].  Hypertension. Hyperlipidemia. Breast cancer, left-sided status postmastectomy  PAST SURGICAL HISTORY: PCI to the coronary artery Three-vessel CABG, 2013 Bilateral carpal tunnel surgery. Tubal ligation. Appendectomy.  FAMILY HISTORY: No family history of premature CAD.   Father passed away at the age of 21 due to myocardial infarction.  And mother passed away at the age of 30 due to Alzheimer's disease complications.  SOCIAL HISTORY:  The patient  reports that she has quit smoking. Her smoking use included cigarettes. She has a 45.00 pack-year smoking history. She has never used smokeless tobacco. She reports that she does not use drugs.  REVIEW OF SYSTEMS: Review of Systems  Constitutional: Negative for chills and fever.  HENT:  Negative for hoarse voice and nosebleeds.   Eyes:  Negative for discharge, double vision and pain.  Cardiovascular:  Negative for chest pain, claudication, dyspnea on exertion, leg swelling, near-syncope, orthopnea, palpitations, paroxysmal nocturnal dyspnea and syncope.  Respiratory:  Positive for shortness of breath. Negative for hemoptysis.   Musculoskeletal:  Negative for muscle cramps and myalgias.  Gastrointestinal:  Negative for abdominal pain, constipation, diarrhea, hematemesis, hematochezia, melena, nausea and vomiting.  Neurological:  Negative for dizziness and light-headedness.   PHYSICAL EXAM: Vitals with BMI 06/04/2021 05/24/2021 05/24/2021  Height '5\' 2"'$  - -  Weight 194 lbs 13 oz - -  BMI [id number] - -  Systolic Q000111Q 0000000 [id number]  Diastolic 89 93 80  Pulse 86 67 85   CONSTITUTIONAL: Age-appropriate female, well-developed and well-nourished. No acute distress. SKIN: Skin is warm and dry. No rash noted. No cyanosis. No pallor. No jaundice HEAD: Normocephalic and atraumatic. EYES: No scleral icterus MOUTH/THROAT: Moist oral membranes. NECK: No JVD present. No thyromegaly noted. No carotid bruits LYMPHATIC: No visible cervical adenopathy. CHEST Normal respiratory effort. No intercostal retractions LUNGS: Clear to auscultation bilaterally.  No stridor. No wheezes. No rales. CARDIOVASCULAR: Irregularly irregular, positive Q000111Q, soft holosystolic murmur heard at the apex, no gallops or rubs ABDOMINAL: Obese, soft, nontender,  nondistended, positive bowel sounds in all 4 quadrants, no apparent ascites. EXTREMITIES: No peripheral edema HEMATOLOGIC: No significant bruising NEUROLOGIC: Oriented to person, place, and time. Nonfocal. Normal muscle tone. PSYCHIATRIC: Normal mood and affect. Normal behavior. Cooperative  CARDIAC DATABASE: EKG: 05/22/2021: Atrial fibrillation, controlled ventricular rate, 77 bpm, rare PVCs, without underlying ischemia or injury pattern.  06/04/2021: Atrial fibrillation, 76 bpm, without underlying ischemia or injury pattern.    Echocardiogram: 05/23/2021: LVEF 30-35%, global hypokinesis, average GLS -11.4%, moderately reduced right ventricular function, normal right ventricular size, mild MR, aortic valve sclerosis without stenosis, no LV thrombus.  Stress Testing: No results found for this or any previous visit from the past 1095 days.  Heart Catheterization: None  LABORATORY DATA: CBC Latest Ref Rng & Units 05/29/2021 05/22/2021 05/22/2021  WBC 4.0 - 10.5 K/uL - - 11.4(H)  Hemoglobin 11.1 - 15.9 g/dL 13.3 12.6 12.9  Hematocrit 34.0 - 46.6 % 38.6 37.0 38.6  Platelets 150 - 400 K/uL - - 204    CMP Latest Ref Rng & Units 05/29/2021 05/22/2021 05/22/2021  Glucose 65 - 99 mg/dL 95 115(H) 117(H)  BUN 8 - 27 mg/dL '20 21 20  '$ Creatinine 0.57 - 1.00 mg/dL 1.06(H) 1.10(H) 1.15(H)  Sodium 134 - 144 mmol/L 139 140 137  Potassium 3.5 - 5.2 mmol/L 4.1 3.8 3.7  Chloride 96 - 106 mmol/L 103 105 106  CO2 20 - 29 mmol/L 21 - 24  Calcium 8.7 - 10.3 mg/dL 9.1 - 8.8(L)  Total Protein 6.5 - 8.1 g/dL - -  6.6  Total Bilirubin 0.3 - 1.2 mg/dL - - 0.8  Alkaline Phos 38 - 126 U/L - - 74  AST 15 - 41 U/L - - 20  ALT 0 - 44 U/L - - 22    Lipid Panel     Component Value Date/Time   CHOL 143 05/23/2021 1914   TRIG 106 05/23/2021 1914   HDL 49 05/23/2021 1914   CHOLHDL 2.9 05/23/2021 1914   VLDL 21 05/23/2021 1914   LDLCALC 73 05/23/2021 1914    No components found for: NTPROBNP Recent Labs     05/29/21 1139  PROBNP 2,074*   Recent Labs    05/23/21 1914  TSH 0.834    BMP Recent Labs    05/22/21 1626 05/22/21 1644 05/29/21 1140  NA 137 140 139  K 3.7 3.8 4.1  CL 106 105 103  CO2 24  --  21  GLUCOSE 117* 115* 95  BUN '20 21 20  '$ CREATININE 1.15* 1.10* 1.06*  CALCIUM 8.8*  --  9.1  GFRNONAA 50*  --   --     HEMOGLOBIN A1C Lab Results  Component Value Date   HGBA1C 5.6 05/23/2021   MPG 114.02 05/23/2021    IMPRESSION:    ICD-10-CM   1. Persistent atrial fibrillation (HCC)  I48.19 EKG 12-Lead    2. Long term (current) use of anticoagulants  Z79.01     3. Atherosclerosis of native coronary artery of native heart without angina pectoris  I25.10     4. Hx of CABG  Z95.1     5. Chronic HFrEF (heart failure with reduced ejection fraction) (HCC)  I50.22 sacubitril-valsartan (ENTRESTO) 49-51 MG    Basic metabolic panel    Magnesium    Pro b natriuretic peptide (BNP)    6. 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 Benign hypertension  I10     8. Lacunar stroke ([place])  I63.81     9. Malignant neoplasm of left female breast, unspecified estrogen receptor status, unspecified site of breast ([place])  C50.912     10. Mixed hyperlipidemia  E78.2     11. Former smoker  Z87.891        RECOMMENDATIONS: Marinelle Hodgman is a 75 y.o. female whose past medical history and cardiac risk factors include: Persistent atrial fibrillation (chronicity unknown), established coronary artery disease status post three-vessel CABG (2013), hypertension, hyperlipidemia, history of left-sided breast cancer status postmastectomy, former smoker, postmenopausal female, advanced age, obesity due to excess calories.  Persistent atrial fibrillation: Rate control: Lopressor. Rhythm control: N/A. Thromboembolic prophylaxis: Eliquis Patient is currently rate controlled we will continue with medical therapy for now and reevaluate at the next office  visit.  If she remains in A. fib we will discuss considering cardioversion after being on oral anticoagulation for at least 4 weeks.  Patient is agreeable with the plan of care.  Long-term oral anticoagulation: Indication: Atrial fibrillation. CHA2DS2-VASc SCORE is 6 which correlates to 9.8% risk of stroke per year. Does not endorse any evidence of bleeding. Fall precautions reviewed. Discussed risks, benefits, alternatives to oral anticoagulation.  Chronic HFrEF, stage B, NYHA class II: Patient tolerated initiation of Entresto well. Labs from 05/29/2021 independently reviewed and noted above. Medications reconciled. I have asked her to take Entresto 24/26 mg 2 tabs twice a day for a week and to have blood work done.  If labs are stable than she is asked to pick up the new Entresto prescription for 49/51  mg p.o. twice daily. Will continue to uptitrate her GDMT as hemodynamics and laboratory values allow.  Cardiomyopathy, suspect ischemic etiology: See above  Established CAD with prior PCI followed by three-vessel bypass without angina pectoris: Currently anginal free. Will continue to uptitrate GDMT. Echocardiogram results noted above. Will repeat echocardiogram in 90 days after she has been on maximally tolerated, directed medical therapy to reevaluate her LVEF.  Benign essential hypertension: Home blood pressures are improving as her medications are being uptitrated. Low-salt diet recommended.  Hyperlipidemia: Fasting lipid profile reviewed. Continue statin therapy Does not endorse myalgias.  Former smoker: Educated on the importance of complete smoking cessation.  FINAL MEDICATION LIST END OF ENCOUNTER: Meds ordered this encounter  Medications   sacubitril-valsartan (ENTRESTO) 49-51 MG    Sig: Take 1 tablet by mouth 2 (two) times daily.    Dispense:  60 tablet    Refill:  0     Medications Discontinued During This Encounter  Medication Reason   sacubitril-valsartan  (ENTRESTO) 24-26 MG Dose change     Current Outpatient Medications:    albuterol (VENTOLIN HFA) 108 (90 Base) MCG/ACT inhaler, Inhale 2 puffs into the lungs as needed for wheezing or shortness of breath., Disp: , Rfl:    apixaban (ELIQUIS) 5 MG TABS tablet, Take 1 tablet (5 mg total) by mouth 2 (two) times daily., Disp: 60 tablet, Rfl: 1   aspirin 81 MG EC tablet, Take 81 mg by mouth daily., Disp: , Rfl:    esomeprazole (NEXIUM) 40 MG capsule, Take 40 mg by mouth daily., Disp: , Rfl:    famotidine (PEPCID) 20 MG tablet, Take 20 mg by mouth as needed for heartburn., Disp: , Rfl:    levothyroxine (SYNTHROID) 125 MCG tablet, Take 125 mcg by mouth daily., Disp: , Rfl:    loratadine (CLARITIN) 10 MG tablet, Take 10 mg by mouth daily., Disp: , Rfl:    metoprolol tartrate (LOPRESSOR) 25 MG tablet, Take 1 tablet (25 mg total) by mouth 2 (two) times daily., Disp: 60 tablet, Rfl: 0   montelukast (SINGULAIR) 10 MG tablet, Take 10 mg by mouth at bedtime., Disp: , Rfl:    rosuvastatin (CRESTOR) 10 MG tablet, Take 10 mg by mouth daily., Disp: , Rfl:    sacubitril-valsartan (ENTRESTO) 49-51 MG, Take 1 tablet by mouth 2 (two) times daily., Disp: 60 tablet, Rfl: 0   SYMBICORT 160-4.5 MCG/ACT inhaler, Inhale 2 puffs into the lungs 2 (two) times daily., Disp: , Rfl:   Orders Placed This Encounter  Procedures   Basic metabolic panel   Magnesium   Pro b natriuretic peptide (BNP)   EKG 12-Lead    There are no Patient Instructions on file for this visit.   --Continue cardiac medications as reconciled in final medication list. --Return in about 2 weeks (around 06/18/2021) for Follow up, heart failure management., A. fib. Or sooner if needed. --Continue follow-up with your primary care physician regarding the management of your other chronic comorbid conditions.  Patient's questions and concerns were addressed to her satisfaction. She voices understanding of the instructions provided during this encounter.    This note was created using a voice recognition software as a result there may be grammatical errors inadvertently enclosed that do not reflect the nature of this encounter. Every attempt is made to correct such errors.  Rex Kras, [place], [facility]  Pager: [phone] Office: [phone]

## 2021-06-09 ENCOUNTER — Other Ambulatory Visit: Payer: Self-pay

## 2021-06-09 DIAGNOSIS — I5022 Chronic systolic (congestive) heart failure: Secondary | ICD-10-CM

## 2021-06-11 DIAGNOSIS — I5022 Chronic systolic (congestive) heart failure: Secondary | ICD-10-CM | POA: Diagnosis not present

## 2021-06-12 ENCOUNTER — Telehealth: Payer: Self-pay | Admitting: Cardiology

## 2021-06-12 ENCOUNTER — Other Ambulatory Visit: Payer: Self-pay

## 2021-06-12 DIAGNOSIS — I5022 Chronic systolic (congestive) heart failure: Secondary | ICD-10-CM

## 2021-06-12 DIAGNOSIS — I429 Cardiomyopathy, unspecified: Secondary | ICD-10-CM

## 2021-06-12 LAB — BASIC METABOLIC PANEL
BUN/Creatinine Ratio: 15 (ref 12–28)
BUN: 17 mg/dL (ref 8–27)
CO2: 20 mmol/L (ref 20–29)
Calcium: 9.2 mg/dL (ref 8.7–10.3)
Chloride: 104 mmol/L (ref 96–106)
Creatinine, Ser: 1.1 mg/dL — ABNORMAL HIGH (ref 0.57–1.00)
Glucose: 130 mg/dL — ABNORMAL HIGH (ref 65–99)
Potassium: 4.1 mmol/L (ref 3.5–5.2)
Sodium: 141 mmol/L (ref 134–144)
eGFR: 53 mL/min/{1.73_m2} — ABNORMAL LOW (ref >59)

## 2021-06-12 LAB — PRO B NATRIURETIC PEPTIDE: NT-Pro BNP: 1082 pg/mL — ABNORMAL HIGH (ref 0–301)

## 2021-06-12 LAB — MAGNESIUM: Magnesium: 1.8 mg/dL (ref 1.6–2.3)

## 2021-06-12 MED ORDER — ENTRESTO 49-51 MG PO TABS: 1.0000 | ORAL_TABLET | Freq: Two times a day (BID) | ORAL | 2 refills | Status: AC

## 2021-06-12 NOTE — Progress Notes (Signed)
[facility] spoke with her

## 2021-06-12 NOTE — Telephone Encounter (Signed)
Pt called to say she got notified results from her blood test are in and would like to discuss.

## 2021-06-12 NOTE — Telephone Encounter (Signed)
Pt aware of lab results 

## 2021-06-12 NOTE — Progress Notes (Signed)
Called pt no answer, left a vm, but send in her Rx to pharmacy

## 2021-06-18 ENCOUNTER — Ambulatory Visit: Payer: Medicare HMO | Admitting: Cardiology

## 2021-06-18 ENCOUNTER — Encounter: Payer: Self-pay | Admitting: Cardiology

## 2021-06-18 ENCOUNTER — Other Ambulatory Visit: Payer: Self-pay

## 2021-06-18 VITALS — BP 147/72 | HR 78 | Resp 16 | Ht 62.0 in | Wt 196.6 lb

## 2021-06-18 DIAGNOSIS — I429 Cardiomyopathy, unspecified: Secondary | ICD-10-CM

## 2021-06-18 DIAGNOSIS — I4819 Other persistent atrial fibrillation: Secondary | ICD-10-CM

## 2021-06-18 DIAGNOSIS — Z951 Presence of aortocoronary bypass graft: Secondary | ICD-10-CM | POA: Diagnosis not present

## 2021-06-18 DIAGNOSIS — I6381 Other cerebral infarction due to occlusion or stenosis of small artery: Secondary | ICD-10-CM | POA: Diagnosis not present

## 2021-06-18 DIAGNOSIS — C50912 Malignant neoplasm of unspecified site of left female breast: Secondary | ICD-10-CM

## 2021-06-18 DIAGNOSIS — Z7901 Long term (current) use of anticoagulants: Secondary | ICD-10-CM | POA: Diagnosis not present

## 2021-06-18 DIAGNOSIS — I5022 Chronic systolic (congestive) heart failure: Secondary | ICD-10-CM

## 2021-06-18 DIAGNOSIS — I2510 Atherosclerotic heart disease of native coronary artery without angina pectoris: Secondary | ICD-10-CM

## 2021-06-18 DIAGNOSIS — E782 Mixed hyperlipidemia: Secondary | ICD-10-CM

## 2021-06-18 DIAGNOSIS — Z87891 Personal history of nicotine dependence: Secondary | ICD-10-CM

## 2021-06-18 DIAGNOSIS — I10 Essential (primary) hypertension: Secondary | ICD-10-CM

## 2021-06-18 MED ORDER — METOPROLOL SUCCINATE ER 50 MG PO TB24: 50.0000 mg | ORAL_TABLET | Freq: Every morning | ORAL | 0 refills | Status: DC

## 2021-06-18 MED ORDER — DAPAGLIFLOZIN PROPANEDIOL 10 MG PO TABS: 10.0000 mg | ORAL_TABLET | Freq: Every day | ORAL | 0 refills | Status: DC

## 2021-06-18 MED ORDER — APIXABAN 5 MG PO TABS: 5.0000 mg | ORAL_TABLET | Freq: Two times a day (BID) | ORAL | 1 refills | Status: DC

## 2021-06-18 NOTE — Progress Notes (Signed)
Date:  06/18/2021   ID:  Leah Day, DOB 1946/08/26, MRN [id number]  PCP:  Nicholos Johns, MD  Cardiologist:  Rex Kras, DO, [facility] (established care 05/23/2021)  Date: 06/18/21 Last Office Visit: 06/04/2021  Chief Complaint  Patient presents with   heart failure with reduced ejection fraction   Atrial Fibrillation   Follow-up    HPI  Leah Day is a 75 y.o. female who presents to the office with a chief complaint of " atrial fibrillation and congestive heart failure management." Patient's past medical history and cardiovascular risk factors include: Persistent atrial fibrillation (chronicity unknown), established coronary artery disease status post three-vessel CABG (2013), hypertension, hyperlipidemia, history of left-sided breast cancer status postmastectomy, former smoker, postmenopausal female, advanced age, obesity due to excess calories.  She presented to the hospital with right-sided focal neurological deficits and received tPA and tolerated the medication well without any side effects or intolerances.  Following day patient was back to baseline without any focal neurological deficits.  Stroke work-up noted atrial fibrillation on surface ECG and moderately reduced LVEF on surface echocardiogram and therefore cardiology was consulted for further evaluation and management.  It appears that the patient underwent CABG in 2013 and after that had not followed up with cardiology until her recent hospitalization in July 2022.  Given her moderately reduced LVEF she was started on guideline directed medical therapy and now presents for follow-up for further up titration.  She is currently euvolemic and not in congestive heart failure.  And she denies any chest pain to suggest angina pectoris.  Patient's Entresto dose has now been uptitrated to 49/51 mg p.o. twice daily and renal function remained stable.  She has not been also on oral anticoagulation for 4 weeks and is here to  discuss possible direct-current cardioversion plus or minus TEE.  ALLERGIES: Allergies  Allergen Reactions   Cephalexin Rash        Atenolol Swelling   Irbesartan Cough   Lisinopril Cough    MEDICATION LIST PRIOR TO VISIT: Current Meds  Medication Sig   albuterol (VENTOLIN HFA) 108 (90 Base) MCG/ACT inhaler Inhale 2 puffs into the lungs as needed for wheezing or shortness of breath.   aspirin 81 MG EC tablet Take 81 mg by mouth daily.   dapagliflozin propanediol (FARXIGA) 10 MG TABS tablet Take 1 tablet (10 mg total) by mouth daily before breakfast.   esomeprazole (NEXIUM) 40 MG capsule Take 40 mg by mouth daily.   famotidine (PEPCID) 20 MG tablet Take 20 mg by mouth as needed for heartburn.   levothyroxine (SYNTHROID) 125 MCG tablet Take 125 mcg by mouth daily.   loratadine (CLARITIN) 10 MG tablet Take 10 mg by mouth daily.   metoprolol succinate (TOPROL-XL) 50 MG 24 hr tablet Take 1 tablet (50 mg total) by mouth every morning. Take with or immediately following a meal.   montelukast (SINGULAIR) 10 MG tablet Take 10 mg by mouth at bedtime.   rosuvastatin (CRESTOR) 10 MG tablet Take 10 mg by mouth daily.   sacubitril-valsartan (ENTRESTO) 49-51 MG Take 1 tablet by mouth 2 (two) times daily.   SYMBICORT 160-4.5 MCG/ACT inhaler Inhale 2 puffs into the lungs 2 (two) times daily.   [DISCONTINUED] apixaban (ELIQUIS) 5 MG TABS tablet Take 1 tablet (5 mg total) by mouth 2 (two) times daily.   [DISCONTINUED] metoprolol tartrate (LOPRESSOR) 25 MG tablet Take 1 tablet (25 mg total) by mouth 2 (two) times daily.     PAST MEDICAL HISTORY: Coronary artery disease  status post CABG 2013 at [facility]. Hypertension. Hyperlipidemia. Breast cancer, left-sided status postmastectomy Cardiomyopathy. Atrial fibrillation.  PAST SURGICAL HISTORY: PCI to the coronary artery Three-vessel CABG, 2013 Bilateral carpal tunnel surgery. Tubal ligation. Appendectomy.  FAMILY HISTORY: No family  history of premature CAD.  Father passed away at the age of 14 due to myocardial infarction.  And mother passed away at the age of 62 due to Alzheimer's disease complications.  SOCIAL HISTORY:  The patient  reports that she has quit smoking. Her smoking use included cigarettes. She has a 45.00 pack-year smoking history. She has never used smokeless tobacco. She reports that she does not use drugs.  REVIEW OF SYSTEMS: Review of Systems  Constitutional: Negative for chills and fever.  HENT:  Negative for hoarse voice and nosebleeds.   Eyes:  Negative for discharge, double vision and pain.  Cardiovascular:  Negative for chest pain, claudication, dyspnea on exertion, leg swelling, near-syncope, orthopnea, palpitations, paroxysmal nocturnal dyspnea and syncope.  Respiratory:  Positive for shortness of breath. Negative for hemoptysis.   Musculoskeletal:  Negative for muscle cramps and myalgias.  Gastrointestinal:  Negative for abdominal pain, constipation, diarrhea, hematemesis, hematochezia, melena, nausea and vomiting.  Neurological:  Negative for dizziness and light-headedness.   PHYSICAL EXAM: Vitals with BMI 06/18/2021 06/04/2021 05/24/2021  Height '5\' 2"'$  '5\' 2"'$  -  Weight 196 lbs 10 oz 194 lbs 13 oz -  BMI [id number] -  Systolic Q000111Q Q000111Q 0000000  Diastolic 72 89 93  Pulse 78 86 67   CONSTITUTIONAL: Age-appropriate female, well-developed and well-nourished. No acute distress. SKIN: Skin is warm and dry. No rash noted. No cyanosis. No pallor. No jaundice HEAD: Normocephalic and atraumatic. EYES: No scleral icterus MOUTH/THROAT: Moist oral membranes. NECK: No JVD present. No thyromegaly noted. No carotid bruits LYMPHATIC: No visible cervical adenopathy. CHEST Normal respiratory effort. No intercostal retractions LUNGS: Clear to auscultation bilaterally.  No stridor. No wheezes. No rales. CARDIOVASCULAR: Irregularly irregular, positive Q000111Q, soft holosystolic murmur heard at the apex, no gallops  or rubs ABDOMINAL: Obese, soft, nontender, nondistended, positive bowel sounds in all 4 quadrants, no apparent ascites. EXTREMITIES: No peripheral edema, 2+ DP and PT pulses. HEMATOLOGIC: No significant bruising NEUROLOGIC: Oriented to person, place, and time. Nonfocal. Normal muscle tone. PSYCHIATRIC: Normal mood and affect. Normal behavior. Cooperative  CARDIAC DATABASE: EKG: 05/22/2021: Atrial fibrillation, controlled ventricular rate, 77 bpm, rare PVCs, without underlying ischemia or injury pattern.  06/04/2021: Atrial fibrillation, 76 bpm, without underlying ischemia or injury pattern.    Echocardiogram: 05/23/2021: LVEF 30-35%, global hypokinesis, average GLS -11.4%, moderately reduced right ventricular function, normal right ventricular size, mild MR, aortic valve sclerosis without stenosis, no LV thrombus.  Stress Testing: No results found for this or any previous visit from the past 1095 days.  Heart Catheterization: None  LABORATORY DATA: CBC Latest Ref Rng & Units 05/29/2021 05/22/2021 05/22/2021  WBC 4.0 - 10.5 K/uL - - 11.4(H)  Hemoglobin 11.1 - 15.9 g/dL 13.3 12.6 12.9  Hematocrit 34.0 - 46.6 % 38.6 37.0 38.6  Platelets 150 - 400 K/uL - - 204    CMP Latest Ref Rng & Units 06/11/2021 05/29/2021 05/22/2021  Glucose 65 - 99 mg/dL 130(H) 95 115(H)  BUN 8 - 27 mg/dL '17 20 21  '$ Creatinine 0.57 - 1.00 mg/dL 1.10(H) 1.06(H) 1.10(H)  Sodium 134 - 144 mmol/L 141 139 140  Potassium 3.5 - 5.2 mmol/L 4.1 4.1 3.8  Chloride 96 - 106 mmol/L 104 103 105  CO2 20 - 29 mmol/L  20 21 -  Calcium 8.7 - 10.3 mg/dL 9.2 9.1 -  Total Protein 6.5 - 8.1 g/dL - - -  Total Bilirubin 0.3 - 1.2 mg/dL - - -  Alkaline Phos 38 - 126 U/L - - -  AST 15 - 41 U/L - - -  ALT 0 - 44 U/L - - -    Lipid Panel     Component Value Date/Time   CHOL 143 05/23/2021 1914   TRIG 106 05/23/2021 1914   HDL 49 05/23/2021 1914   CHOLHDL 2.9 05/23/2021 1914   VLDL 21 05/23/2021 1914   LDLCALC 73 05/23/2021 1914     No components found for: NTPROBNP Recent Labs    05/29/21 1139 06/11/21 1353  PROBNP 2,074* 1,082*   Recent Labs    05/23/21 1914  TSH 0.834    BMP Recent Labs    05/22/21 1626 05/22/21 1644 05/29/21 1140 06/11/21 1354  NA 137 140 139 141  K 3.7 3.8 4.1 4.1  CL 106 105 103 104  CO2 24  --  21 20  GLUCOSE 117* 115* 95 130*  BUN '20 21 20 17  '$ CREATININE 1.15* 1.10* 1.06* 1.10*  CALCIUM 8.8*  --  9.1 9.2  GFRNONAA 50*  --   --   --     HEMOGLOBIN A1C Lab Results  Component Value Date   HGBA1C 5.6 05/23/2021   MPG 114.02 05/23/2021    IMPRESSION:    ICD-10-CM   1. Persistent atrial fibrillation (HCC)  I48.19 apixaban (ELIQUIS) 5 MG TABS tablet    2. Long term (current) use of anticoagulants  Z79.01 apixaban (ELIQUIS) 5 MG TABS tablet    3. Atherosclerosis of native coronary artery of native heart without angina pectoris  I25.10     4. Hx of CABG  Z95.1 dapagliflozin propanediol (FARXIGA) 10 MG TABS tablet    5. Chronic HFrEF (heart failure with reduced ejection fraction) (HCC)  I50.22 dapagliflozin propanediol (FARXIGA) 10 MG TABS tablet    metoprolol succinate (TOPROL-XL) 50 MG 24 hr tablet    Basic metabolic panel    Magnesium    Pro b natriuretic peptide (BNP)    6. 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 Benign hypertension  I10     8. 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)  I63.81     9. Malignant neoplasm of left female breast, unspecified estrogen receptor status, unspecified site of breast ([place])  C50.912     10. Mixed hyperlipidemia  E78.2     11. Former smoker  Z87.891        RECOMMENDATIONS: Leah Day is a 75 y.o. female whose past medical history and cardiac risk factors include: Persistent atrial fibrillation (chronicity unknown), established coronary artery disease status post three-vessel CABG (2013), hypertension, hyperlipidemia, history of left-sided breast cancer status postmastectomy, former smoker, postmenopausal female, advanced  age, obesity due to excess calories.  Persistent atrial fibrillation: Rate control: Metoprolol. Rhythm control: N/A. Thromboembolic prophylaxis: Eliquis Discussed the risks, benefits, and alternatives to transesophageal echocardiogram guided direct-current cardioversion.  Patient states that she would like to think about this and will call back if she would like to proceed with the procedure otherwise we will discuss at the next office visit.  Long-term oral anticoagulation: Indication: Atrial fibrillation. CHA2DS2-VASc SCORE is 6 which correlates to 9.8% risk of stroke per year. Does not endorse any evidence of bleeding. Fall precautions reviewed. Discussed risks, benefits, alternatives to oral anticoagulation. Eliquis refilled.  Chronic HFrEF, stage B, NYHA  class II: Euvolemic.   Medications reconciled. Start Farxiga 10 mg p.o. daily.  Blood work in 1 week to evaluate kidney function and electrolytes. Will continue to uptitrate her GDMT as hemodynamics and laboratory values allow. When she is on maximally tolerated GDMT for 90 days would recommend a repeat echocardiogram to reevaluate LVEF.  Cardiomyopathy, suspect ischemic etiology: See above  Established CAD with prior PCI followed by three-vessel bypass without angina pectoris: Currently anginal free. Transition Lopressor to Toprol-XL 50 mg p.o. daily We discussed undergoing ischemic evaluation; however, the shared decision was to hold off either stress testing or invasive angiography at this time as she is currently asymptomatic.  Would like to focus on uptitrating GDMT given her moderately reduced LVEF and A. fib management.  Benign essential hypertension: Home blood pressures are improving as her medications are being uptitrated. Low-salt diet recommended.  Hyperlipidemia: Fasting lipid profile reviewed. Continue statin therapy Does not endorse myalgias.  Former smoker: Educated on the importance of complete smoking  cessation.  FINAL MEDICATION LIST END OF ENCOUNTER: Meds ordered this encounter  Medications   dapagliflozin propanediol (FARXIGA) 10 MG TABS tablet    Sig: Take 1 tablet (10 mg total) by mouth daily before breakfast.    Dispense:  90 tablet    Refill:  0   apixaban (ELIQUIS) 5 MG TABS tablet    Sig: Take 1 tablet (5 mg total) by mouth 2 (two) times daily.    Dispense:  60 tablet    Refill:  1   metoprolol succinate (TOPROL-XL) 50 MG 24 hr tablet    Sig: Take 1 tablet (50 mg total) by mouth every morning. Take with or immediately following a meal.    Dispense:  90 tablet    Refill:  0     Medications Discontinued During This Encounter  Medication Reason   metoprolol tartrate (LOPRESSOR) 25 MG tablet Change in therapy   apixaban (ELIQUIS) 5 MG TABS tablet Reorder     Current Outpatient Medications:    albuterol (VENTOLIN HFA) 108 (90 Base) MCG/ACT inhaler, Inhale 2 puffs into the lungs as needed for wheezing or shortness of breath., Disp: , Rfl:    aspirin 81 MG EC tablet, Take 81 mg by mouth daily., Disp: , Rfl:    dapagliflozin propanediol (FARXIGA) 10 MG TABS tablet, Take 1 tablet (10 mg total) by mouth daily before breakfast., Disp: 90 tablet, Rfl: 0   esomeprazole (NEXIUM) 40 MG capsule, Take 40 mg by mouth daily., Disp: , Rfl:    famotidine (PEPCID) 20 MG tablet, Take 20 mg by mouth as needed for heartburn., Disp: , Rfl:    levothyroxine (SYNTHROID) 125 MCG tablet, Take 125 mcg by mouth daily., Disp: , Rfl:    loratadine (CLARITIN) 10 MG tablet, Take 10 mg by mouth daily., Disp: , Rfl:    metoprolol succinate (TOPROL-XL) 50 MG 24 hr tablet, Take 1 tablet (50 mg total) by mouth every morning. Take with or immediately following a meal., Disp: 90 tablet, Rfl: 0   montelukast (SINGULAIR) 10 MG tablet, Take 10 mg by mouth at bedtime., Disp: , Rfl:    rosuvastatin (CRESTOR) 10 MG tablet, Take 10 mg by mouth daily., Disp: , Rfl:    sacubitril-valsartan (ENTRESTO) 49-51 MG, Take 1  tablet by mouth 2 (two) times daily., Disp: 60 tablet, Rfl: 2   SYMBICORT 160-4.5 MCG/ACT inhaler, Inhale 2 puffs into the lungs 2 (two) times daily., Disp: , Rfl:    apixaban (ELIQUIS) 5 MG TABS  tablet, Take 1 tablet (5 mg total) by mouth 2 (two) times daily., Disp: 60 tablet, Rfl: 1  Orders Placed This Encounter  Procedures   Basic metabolic panel   Magnesium   Pro b natriuretic peptide (BNP)    There are no Patient Instructions on file for this visit.   --Continue cardiac medications as reconciled in final medication list. --Return in about 4 weeks (around 07/16/2021) for Follow up, heart failure management., A. fib. Or sooner if needed. --Continue follow-up with your primary care physician regarding the management of your other chronic comorbid conditions.  Patient's questions and concerns were addressed to her satisfaction. She voices understanding of the instructions provided during this encounter.   This note was created using a voice recognition software as a result there may be grammatical errors inadvertently enclosed that do not reflect the nature of this encounter. Every attempt is made to correct such errors.  Rex Kras, [place], [facility]  Pager: [phone] Office: [phone]

## 2021-06-24 DIAGNOSIS — H524 Presbyopia: Secondary | ICD-10-CM | POA: Diagnosis not present

## 2021-06-24 DIAGNOSIS — H43393 Other vitreous opacities, bilateral: Secondary | ICD-10-CM | POA: Diagnosis not present

## 2021-06-25 ENCOUNTER — Other Ambulatory Visit: Payer: Self-pay | Admitting: Cardiology

## 2021-06-25 DIAGNOSIS — I10 Essential (primary) hypertension: Secondary | ICD-10-CM | POA: Diagnosis not present

## 2021-06-25 DIAGNOSIS — E559 Vitamin D deficiency, unspecified: Secondary | ICD-10-CM | POA: Diagnosis not present

## 2021-06-25 DIAGNOSIS — E039 Hypothyroidism, unspecified: Secondary | ICD-10-CM | POA: Diagnosis not present

## 2021-06-25 DIAGNOSIS — H5213 Myopia, bilateral: Secondary | ICD-10-CM | POA: Diagnosis not present

## 2021-06-25 DIAGNOSIS — H52209 Unspecified astigmatism, unspecified eye: Secondary | ICD-10-CM | POA: Diagnosis not present

## 2021-06-25 DIAGNOSIS — H524 Presbyopia: Secondary | ICD-10-CM | POA: Diagnosis not present

## 2021-06-25 DIAGNOSIS — R059 Cough, unspecified: Secondary | ICD-10-CM | POA: Diagnosis not present

## 2021-06-25 DIAGNOSIS — I5022 Chronic systolic (congestive) heart failure: Secondary | ICD-10-CM | POA: Diagnosis not present

## 2021-06-25 DIAGNOSIS — E785 Hyperlipidemia, unspecified: Secondary | ICD-10-CM | POA: Diagnosis not present

## 2021-06-25 DIAGNOSIS — Z8673 Personal history of transient ischemic attack (TIA), and cerebral infarction without residual deficits: Secondary | ICD-10-CM | POA: Diagnosis not present

## 2021-06-25 DIAGNOSIS — K219 Gastro-esophageal reflux disease without esophagitis: Secondary | ICD-10-CM | POA: Diagnosis not present

## 2021-06-25 DIAGNOSIS — Z79899 Other long term (current) drug therapy: Secondary | ICD-10-CM | POA: Diagnosis not present

## 2021-06-25 DIAGNOSIS — J309 Allergic rhinitis, unspecified: Secondary | ICD-10-CM | POA: Diagnosis not present

## 2021-06-26 LAB — BASIC METABOLIC PANEL
BUN/Creatinine Ratio: 20 (ref 12–28)
BUN: 20 mg/dL (ref 8–27)
CO2: 22 mmol/L (ref 20–29)
Calcium: 9.3 mg/dL (ref 8.7–10.3)
Chloride: 102 mmol/L (ref 96–106)
Creatinine, Ser: 1.02 mg/dL — ABNORMAL HIGH (ref 0.57–1.00)
Glucose: 100 mg/dL — ABNORMAL HIGH (ref 65–99)
Potassium: 4.4 mmol/L (ref 3.5–5.2)
Sodium: 138 mmol/L (ref 134–144)
eGFR: 58 mL/min/{1.73_m2} — ABNORMAL LOW (ref >59)

## 2021-06-26 LAB — MAGNESIUM: Magnesium: 1.9 mg/dL (ref 1.6–2.3)

## 2021-06-26 LAB — PRO B NATRIURETIC PEPTIDE: NT-Pro BNP: 2063 pg/mL — ABNORMAL HIGH (ref 0–301)

## 2021-06-29 ENCOUNTER — Other Ambulatory Visit: Payer: Self-pay

## 2021-06-29 DIAGNOSIS — I2510 Atherosclerotic heart disease of native coronary artery without angina pectoris: Secondary | ICD-10-CM

## 2021-06-29 DIAGNOSIS — I5022 Chronic systolic (congestive) heart failure: Secondary | ICD-10-CM

## 2021-06-29 NOTE — Progress Notes (Signed)
Spoke to patient she is aware of results. Patient stated she has "a little of shortness of breath" but what she wanted to make you aware since started on metoprolol and farxiga she developed a cough and is wondering if she can go back to the metoprolol tartrate please advise   Patient is aware of next ov and that she needs labs done prior. Labs have been placed and released

## 2021-07-20 DIAGNOSIS — I5022 Chronic systolic (congestive) heart failure: Secondary | ICD-10-CM | POA: Diagnosis not present

## 2021-07-20 DIAGNOSIS — I2510 Atherosclerotic heart disease of native coronary artery without angina pectoris: Secondary | ICD-10-CM | POA: Diagnosis not present

## 2021-07-21 LAB — BASIC METABOLIC PANEL
BUN/Creatinine Ratio: 23 (ref 12–28)
BUN: 23 mg/dL (ref 8–27)
CO2: 20 mmol/L (ref 20–29)
Calcium: 9.3 mg/dL (ref 8.7–10.3)
Chloride: 106 mmol/L (ref 96–106)
Creatinine, Ser: 1 mg/dL (ref 0.57–1.00)
Glucose: 100 mg/dL — ABNORMAL HIGH (ref 65–99)
Potassium: 4.4 mmol/L (ref 3.5–5.2)
Sodium: 141 mmol/L (ref 134–144)
eGFR: 59 mL/min/{1.73_m2} — ABNORMAL LOW (ref >59)

## 2021-07-21 LAB — PRO B NATRIURETIC PEPTIDE: NT-Pro BNP: 1581 pg/mL — ABNORMAL HIGH (ref 0–301)

## 2021-07-21 LAB — MAGNESIUM: Magnesium: 2 mg/dL (ref 1.6–2.3)

## 2021-07-22 ENCOUNTER — Encounter: Payer: Self-pay | Admitting: Cardiology

## 2021-07-22 ENCOUNTER — Other Ambulatory Visit: Payer: Self-pay

## 2021-07-22 ENCOUNTER — Ambulatory Visit: Payer: Medicare HMO | Admitting: Cardiology

## 2021-07-22 VITALS — BP 143/80 | HR 85 | Temp 98.4°F | Resp 16 | Ht 62.0 in | Wt 194.8 lb

## 2021-07-22 DIAGNOSIS — Z7901 Long term (current) use of anticoagulants: Secondary | ICD-10-CM

## 2021-07-22 DIAGNOSIS — I4819 Other persistent atrial fibrillation: Secondary | ICD-10-CM | POA: Diagnosis not present

## 2021-07-22 DIAGNOSIS — Z87891 Personal history of nicotine dependence: Secondary | ICD-10-CM

## 2021-07-22 DIAGNOSIS — I429 Cardiomyopathy, unspecified: Secondary | ICD-10-CM

## 2021-07-22 DIAGNOSIS — I2510 Atherosclerotic heart disease of native coronary artery without angina pectoris: Secondary | ICD-10-CM

## 2021-07-22 DIAGNOSIS — I6381 Other cerebral infarction due to occlusion or stenosis of small artery: Secondary | ICD-10-CM | POA: Diagnosis not present

## 2021-07-22 DIAGNOSIS — C50912 Malignant neoplasm of unspecified site of left female breast: Secondary | ICD-10-CM

## 2021-07-22 DIAGNOSIS — I10 Essential (primary) hypertension: Secondary | ICD-10-CM

## 2021-07-22 DIAGNOSIS — E782 Mixed hyperlipidemia: Secondary | ICD-10-CM

## 2021-07-22 DIAGNOSIS — Z951 Presence of aortocoronary bypass graft: Secondary | ICD-10-CM

## 2021-07-22 DIAGNOSIS — I5022 Chronic systolic (congestive) heart failure: Secondary | ICD-10-CM

## 2021-07-22 MED ORDER — SPIRONOLACTONE 25 MG PO TABS
25.0000 mg | ORAL_TABLET | Freq: Every morning | ORAL | 0 refills | Status: DC
Start: 2021-07-22 — End: 2021-10-15

## 2021-07-22 MED ORDER — APIXABAN 5 MG PO TABS: 5.0000 mg | ORAL_TABLET | Freq: Two times a day (BID) | ORAL | 1 refills | Status: DC

## 2021-07-22 NOTE — H&P (View-Only) (Signed)
Date:  07/22/2021   ID:  Leah Day, DOB 08-17-1946, MRN [id number]  PCP:  Nicholos Johns, MD  Cardiologist:  Rex Kras, DO, [facility] (established care 05/23/2021)  Date: 07/22/21 Last Office Visit: 06/18/2021   Chief Complaint  Patient presents with   heart failure management   Follow-up   Persistent atrial fibrillation     HPI  Leah Day is a 75 y.o. female who presents to the office with a chief complaint of " heart failure and atrial fibrillation management" Patient's past medical history and cardiovascular risk factors include: Persistent atrial fibrillation (chronicity unknown), established coronary artery disease status post three-vessel CABG (2013), hypertension, hyperlipidemia, history of left-sided breast cancer status postmastectomy, former smoker, postmenopausal female, advanced age, obesity due to excess calories.  In July 2022 she presented to the hospital with right-sided focal neurological deficits and received tPA and she was noted to have resolution of her symptoms the following day.  However, stroke work-up noted that she was in atrial fibrillation on surface ECG and echocardiogram noted moderately reduced left ventricular systolic function.  Cardiology was consulted for further evaluation and management.  She has a known history of CABG in 2013 and has not followed up with cardiology until her recent hospitalization in July 2022.  Since her hospitalization she has followed up in the clinic for further up titration of her guideline directed medical therapy for what presumed to be ischemic cardiomyopathy.  At the last office visit she was started on Farxiga which she has tolerated well.  She has had 1 episode of burning in the urine which resolved shortly after increasing fluid intake.  She was not treated for urinary tract infection.  Most recent labs independently reviewed which notes improvement in NT proBNP and renal function remained stable.  With regards to  atrial fibrillation management she is currently on AV nodal blocking agents for rate control as well as oral anticoagulation for thromboembolic prophylaxis.  Patient states that after the last office visit she did do her research with regards to undergoing direct-current cardioversion and would like to proceed forward.  ALLERGIES: Allergies  Allergen Reactions   Cephalexin Rash        Atenolol Swelling   Irbesartan Cough   Lisinopril Cough    MEDICATION LIST PRIOR TO VISIT: Current Meds  Medication Sig   albuterol (VENTOLIN HFA) 108 (90 Base) MCG/ACT inhaler Inhale 2 puffs into the lungs as needed for wheezing or shortness of breath.   aspirin 81 MG EC tablet Take 81 mg by mouth daily.   dapagliflozin propanediol (FARXIGA) 10 MG TABS tablet Take 1 tablet (10 mg total) by mouth daily before breakfast.   esomeprazole (NEXIUM) 40 MG capsule Take 40 mg by mouth daily.   famotidine (PEPCID) 20 MG tablet Take 20 mg by mouth as needed for heartburn.   levothyroxine (SYNTHROID) 125 MCG tablet Take 125 mcg by mouth daily.   loratadine (CLARITIN) 10 MG tablet Take 10 mg by mouth daily.   metoprolol succinate (TOPROL-XL) 50 MG 24 hr tablet Take 1 tablet (50 mg total) by mouth every morning. Take with or immediately following a meal.   montelukast (SINGULAIR) 10 MG tablet Take 10 mg by mouth at bedtime.   rosuvastatin (CRESTOR) 10 MG tablet Take 10 mg by mouth daily.   sacubitril-valsartan (ENTRESTO) 49-51 MG Take 1 tablet by mouth 2 (two) times daily.   spironolactone (ALDACTONE) 25 MG tablet Take 1 tablet (25 mg total) by mouth every morning.   SYMBICORT 160-4.5  MCG/ACT inhaler Inhale 2 puffs into the lungs 2 (two) times daily.   [DISCONTINUED] apixaban (ELIQUIS) 5 MG TABS tablet Take 1 tablet (5 mg total) by mouth 2 (two) times daily.     PAST MEDICAL HISTORY: Coronary artery disease status post CABG 2013 at [facility]. Hypertension. Hyperlipidemia. Breast cancer, left-sided status  postmastectomy Cardiomyopathy. Atrial fibrillation.  PAST SURGICAL HISTORY: PCI to the coronary artery Three-vessel CABG, 2013 Bilateral carpal tunnel surgery. Tubal ligation. Appendectomy.  FAMILY HISTORY: No family history of premature CAD.  Father passed away at the age of 18 due to myocardial infarction.  And mother passed away at the age of 56 due to Alzheimer's disease complications.  SOCIAL HISTORY:  The patient  reports that she has quit smoking. Her smoking use included cigarettes. She has a 45.00 pack-year smoking history. She has never used smokeless tobacco. She reports that she does not use drugs.  REVIEW OF SYSTEMS: Review of Systems  Constitutional: Negative for chills and fever.  HENT:  Negative for hoarse voice and nosebleeds.   Eyes:  Negative for discharge, double vision and pain.  Cardiovascular:  Negative for chest pain, claudication, dyspnea on exertion, leg swelling, near-syncope, orthopnea, palpitations, paroxysmal nocturnal dyspnea and syncope.  Respiratory:  Positive for shortness of breath. Negative for hemoptysis.   Musculoskeletal:  Negative for muscle cramps and myalgias.  Gastrointestinal:  Negative for abdominal pain, constipation, diarrhea, hematemesis, hematochezia, melena, nausea and vomiting.  Neurological:  Negative for dizziness and light-headedness.   PHYSICAL EXAM: Vitals with BMI 07/22/2021 06/18/2021 06/04/2021  Height '5\' 2"'$  '5\' 2"'$  '5\' 2"'$   Weight 194 lbs 13 oz 196 lbs 10 oz 194 lbs 13 oz  BMI 35.62 [id number] [id number]  Systolic [id number] [id number] Q000111Q  Diastolic 80 72 89  Pulse 85 78 86   CONSTITUTIONAL: Age-appropriate female, well-developed and well-nourished. No acute distress. SKIN: Skin is warm and dry. No rash noted. No cyanosis. No pallor. No jaundice HEAD: Normocephalic and atraumatic. EYES: No scleral icterus MOUTH/THROAT: Moist oral membranes. NECK: No JVD present. No thyromegaly noted. No carotid bruits LYMPHATIC: No visible cervical  adenopathy. CHEST Normal respiratory effort. No intercostal retractions LUNGS: Clear to auscultation bilaterally.  No stridor. No wheezes. No rales. CARDIOVASCULAR: Irregularly irregular, positive Q000111Q, soft holosystolic murmur heard at the apex, no gallops or rubs ABDOMINAL: Obese, soft, nontender, nondistended, positive bowel sounds in all 4 quadrants, no apparent ascites. EXTREMITIES: No peripheral edema, 2+ DP and PT pulses. HEMATOLOGIC: No significant bruising NEUROLOGIC: Oriented to person, place, and time. Nonfocal. Normal muscle tone. PSYCHIATRIC: Normal mood and affect. Normal behavior. Cooperative  CARDIAC DATABASE: EKG: 05/22/2021: Atrial fibrillation, controlled ventricular rate, 77 bpm, rare PVCs, without underlying ischemia or injury pattern.  07/22/2021: Atrial fibrillation, controlled ventricular rate, 82 bpm, without underlying injury pattern.   Echocardiogram: 05/23/2021: LVEF 30-35%, global hypokinesis, average GLS -11.4%, moderately reduced right ventricular function, normal right ventricular size, mild MR, aortic valve sclerosis without stenosis, no LV thrombus.  Stress Testing: No results found for this or any previous visit from the past 1095 days.  Heart Catheterization: None  LABORATORY DATA: CBC Latest Ref Rng & Units 05/29/2021 05/22/2021 05/22/2021  WBC 4.0 - 10.5 K/uL - - 11.4(H)  Hemoglobin 11.1 - 15.9 g/dL 13.3 12.6 12.9  Hematocrit 34.0 - 46.6 % 38.6 37.0 38.6  Platelets 150 - 400 K/uL - - 204    CMP Latest Ref Rng & Units 07/20/2021 06/25/2021 06/11/2021  Glucose 65 - 99 mg/dL 100(H) 100(H) 130(H)  BUN 8 -  27 mg/dL '23 20 17  '$ Creatinine 0.57 - 1.00 mg/dL 1.00 1.02(H) 1.10(H)  Sodium 134 - 144 mmol/L 141 138 141  Potassium 3.5 - 5.2 mmol/L 4.4 4.4 4.1  Chloride 96 - 106 mmol/L 106 102 104  CO2 20 - 29 mmol/L '20 22 20  '$ Calcium 8.7 - 10.3 mg/dL 9.3 9.3 9.2  Total Protein 6.5 - 8.1 g/dL - - -  Total Bilirubin 0.3 - 1.2 mg/dL - - -  Alkaline Phos 38 -  126 U/L - - -  AST 15 - 41 U/L - - -  ALT 0 - 44 U/L - - -    Lipid Panel     Component Value Date/Time   CHOL 143 05/23/2021 1914   TRIG 106 05/23/2021 1914   HDL 49 05/23/2021 1914   CHOLHDL 2.9 05/23/2021 1914   VLDL 21 05/23/2021 1914   LDLCALC 73 05/23/2021 1914    No components found for: NTPROBNP Recent Labs    05/29/21 1139 06/11/21 1353 06/25/21 1307 07/20/21 1157  PROBNP 2,074* 1,082* 2,063* 1,581*   Recent Labs    05/23/21 1914  TSH 0.834    BMP Recent Labs    05/22/21 1626 05/22/21 1644 06/11/21 1354 06/25/21 1307 07/20/21 1154  NA 137   < > 141 138 141  K 3.7   < > 4.1 4.4 4.4  CL 106   < > 104 102 106  CO2 24   < > '20 22 20  '$ GLUCOSE 117*   < > 130* 100* 100*  BUN 20   < > '17 20 23  '$ CREATININE 1.15*   < > 1.10* 1.02* 1.00  CALCIUM 8.8*   < > 9.2 9.3 9.3  GFRNONAA 50*  --   --   --   --    < > = values in this interval not displayed.    HEMOGLOBIN A1C Lab Results  Component Value Date   HGBA1C 5.6 05/23/2021   MPG 114.02 05/23/2021    IMPRESSION:    ICD-10-CM   1. Chronic HFrEF (heart failure with reduced ejection fraction) (HCC)  I50.22 spironolactone (ALDACTONE) 25 MG tablet    Basic metabolic panel    Magnesium    sacubitril-valsartan (ENTRESTO) 49-51 MG    2. Persistent atrial fibrillation (HCC)  99991111 Basic metabolic panel    Magnesium    CBC    EKG 12-Lead    3. Long term (current) use of anticoagulants  Z79.01 CBC    4. Atherosclerosis of native coronary artery of native heart without angina pectoris  I25.10     5. Hx of CABG  Z95.1     6. 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 Benign hypertension  I10     8. Lacunar stroke ([place])  I63.81     9. Malignant neoplasm of left female breast, unspecified estrogen receptor status, unspecified site of breast ([place])  C50.912     10. Mixed hyperlipidemia  E78.2     11. Former smoker  Z87.891        RECOMMENDATIONS: Leah Day is a 75 y.o. female whose past  medical history and cardiac risk factors include: Persistent atrial fibrillation (chronicity unknown), established coronary artery disease status post three-vessel CABG (2013), hypertension, hyperlipidemia, history of left-sided breast cancer status postmastectomy, former smoker, postmenopausal female, advanced age, obesity due to excess calories.  Chronic HFrEF, stage B, NYHA class II: Euvolemic.   Medications reconciled. Discussed further up titration of Entresto to 97/103  mg p.o. twice daily.  However, she would like to hold off on this transition for now.  Patient thinks that her cough is secondary to the valsartan component of Entresto. Shared decision was to initiate spironolactone 25 mg p.o. every morning.  Blood work in 1 week to evaluate kidney function and electrolytes. Patient tolerated initiation of Farxiga well without any side effects or intolerances.  She had 1 episode of burning in the urine which was treated with increasing fluid intake.  Patient is educated that if this were to happen often we will need to discuss discontinuation of Farxiga. Will continue to uptitrate her GDMT as hemodynamics and laboratory values allow. When she is on maximally tolerated GDMT for 90 days would recommend a repeat echocardiogram to reevaluate LVEF.  Persistent atrial fibrillation: Rate control: Metoprolol. Rhythm control: N/A. Thromboembolic prophylaxis: Eliquis EKG notes atrial fibrillation with controlled ventricular rate. Patient states that she would like to proceed with transesophageal echocardiogram with direct-current cardioversion to restore normal sinus rhythm. After careful review of history and examination, the risks, benefits of transesophageal echocardiogram, and alternatives have been explained to the patient. Complications include but not limited to esophageal perforation (rare), gastrointestinal bleeding (rare), cardiac arrhythmia which can include cardiac arrest and death (rare),  pharyngeal irritation / discomfort with swallowing / hematoma, methemoglobinemia, bronchospasm, transient hypoxia, nonsustained ventricular tachycardia, transient atrial fibrillation, minimal hemoptysis, vomiting, hypotension, respiratory compromise, reaction to medications, unavoidable damage to teeth and gums, aspiration pneumonia  were reviewed with the patient.  Patient voices understands, provides verbal feedback, questions answered, and patient wishes to proceed with the procedure. Risks, benefits, and alternatives of direct current cardioversion reviewed with the and patient. Risk includes but not limited to: potential for post-cardioversion rhythms, life-threatening arrhythmias (ventricular tachycardia and fibrillation, profound bradycardia, cardiac arrest), myocardial damage, acute pulmonary edema, skin burns, transient hypotension. Benefits include restoration of sinus rhythm. Alternatives to treatment were discussed, questions were answered, patient voices understanding and provides verbal feedback.  Patient is willing to proceed.   Long-term oral anticoagulation: Indication: Atrial fibrillation. CHA2DS2-VASc SCORE is 6 which correlates to 9.8% risk of stroke per year. Does not endorse any evidence of bleeding. Fall precautions reviewed. Discussed risks, benefits, alternatives to oral anticoagulation.  Cardiomyopathy, suspect ischemic etiology: See above  Established CAD with prior PCI followed by three-vessel bypass without angina pectoris: Currently anginal free. Tolerating Toprol-XL well without any side effects or intolerances. We discussed undergoing ischemic evaluation; however, the shared decision was to hold off either stress testing or invasive angiography at this time as she is currently asymptomatic.  Would like to focus on uptitrating GDMT given her moderately reduced LVEF and A. fib management.  Benign essential hypertension: Home blood pressures are improving as her  medications are being uptitrated. Low-salt diet recommended.  Hyperlipidemia: Fasting lipid profile reviewed. Continue statin therapy Does not endorse myalgias.  Former smoker: Educated on the importance of complete smoking cessation.  FINAL MEDICATION LIST END OF ENCOUNTER: Meds ordered this encounter  Medications   spironolactone (ALDACTONE) 25 MG tablet    Sig: Take 1 tablet (25 mg total) by mouth every morning.    Dispense:  90 tablet    Refill:  0    There are no discontinued medications.    Current Outpatient Medications:    albuterol (VENTOLIN HFA) 108 (90 Base) MCG/ACT inhaler, Inhale 2 puffs into the lungs as needed for wheezing or shortness of breath., Disp: , Rfl:    aspirin 81 MG EC tablet, Take 81 mg  by mouth daily., Disp: , Rfl:    dapagliflozin propanediol (FARXIGA) 10 MG TABS tablet, Take 1 tablet (10 mg total) by mouth daily before breakfast., Disp: 90 tablet, Rfl: 0   esomeprazole (NEXIUM) 40 MG capsule, Take 40 mg by mouth daily., Disp: , Rfl:    famotidine (PEPCID) 20 MG tablet, Take 20 mg by mouth as needed for heartburn., Disp: , Rfl:    levothyroxine (SYNTHROID) 125 MCG tablet, Take 125 mcg by mouth daily., Disp: , Rfl:    loratadine (CLARITIN) 10 MG tablet, Take 10 mg by mouth daily., Disp: , Rfl:    metoprolol succinate (TOPROL-XL) 50 MG 24 hr tablet, Take 1 tablet (50 mg total) by mouth every morning. Take with or immediately following a meal., Disp: 90 tablet, Rfl: 0   montelukast (SINGULAIR) 10 MG tablet, Take 10 mg by mouth at bedtime., Disp: , Rfl:    rosuvastatin (CRESTOR) 10 MG tablet, Take 10 mg by mouth daily., Disp: , Rfl:    sacubitril-valsartan (ENTRESTO) 49-51 MG, Take 1 tablet by mouth 2 (two) times daily., Disp: , Rfl:    spironolactone (ALDACTONE) 25 MG tablet, Take 1 tablet (25 mg total) by mouth every morning., Disp: 90 tablet, Rfl: 0   SYMBICORT 160-4.5 MCG/ACT inhaler, Inhale 2 puffs into the lungs 2 (two) times daily., Disp: , Rfl:     apixaban (ELIQUIS) 5 MG TABS tablet, Take 1 tablet (5 mg total) by mouth 2 (two) times daily., Disp: 180 tablet, Rfl: 1  Orders Placed This Encounter  Procedures   Basic metabolic panel   Magnesium   CBC   EKG 12-Lead   There are no Patient Instructions on file for this visit.   --Continue cardiac medications as reconciled in final medication list. --Return in about 4 weeks (around 08/19/2021) for heart failure management and s/p TEE and Cardioversion. . Or sooner if needed. --Continue follow-up with your primary care physician regarding the management of your other chronic comorbid conditions.  Patient's questions and concerns were addressed to her satisfaction. She voices understanding of the instructions provided during this encounter.   This note was created using a voice recognition software as a result there may be grammatical errors inadvertently enclosed that do not reflect the nature of this encounter. Every attempt is made to correct such errors.  Rex Kras, [place], [facility]  Pager: [phone] Office: [phone]

## 2021-07-22 NOTE — Progress Notes (Signed)
Date:  07/22/2021   ID:  Leah Day, DOB 20-May-1946, MRN [id number]  PCP:  Leah Johns, MD  Cardiologist:  Leah Kras, DO, [facility] (established care 05/23/2021)  Date: 07/22/21 Last Office Visit: 06/18/2021   Chief Complaint  Patient presents with   heart failure management   Follow-up   Persistent atrial fibrillation     HPI  Leah Day is a 75 y.o. female who presents to the office with a chief complaint of " heart failure and atrial fibrillation management" Patient's past medical history and cardiovascular risk factors include: Persistent atrial fibrillation (chronicity unknown), established coronary artery disease status post three-vessel CABG (2013), hypertension, hyperlipidemia, history of left-sided breast cancer status postmastectomy, former smoker, postmenopausal female, advanced age, obesity due to excess calories.  In July 2022 she presented to the hospital with right-sided focal neurological deficits and received tPA and she was noted to have resolution of her symptoms the following day.  However, stroke work-up noted that she was in atrial fibrillation on surface ECG and echocardiogram noted moderately reduced left ventricular systolic function.  Cardiology was consulted for further evaluation and management.  She has a known history of CABG in 2013 and has not followed up with cardiology until her recent hospitalization in July 2022.  Since her hospitalization she has followed up in the clinic for further up titration of her guideline directed medical therapy for what presumed to be ischemic cardiomyopathy.  At the last office visit she was started on Farxiga which she has tolerated well.  She has had 1 episode of burning in the urine which resolved shortly after increasing fluid intake.  She was not treated for urinary tract infection.  Most recent labs independently reviewed which notes improvement in NT proBNP and renal function remained stable.  With regards to  atrial fibrillation management she is currently on AV nodal blocking agents for rate control as well as oral anticoagulation for thromboembolic prophylaxis.  Patient states that after the last office visit she did do her research with regards to undergoing direct-current cardioversion and would like to proceed forward.  ALLERGIES: Allergies  Allergen Reactions   Cephalexin Rash        Atenolol Swelling   Irbesartan Cough   Lisinopril Cough    MEDICATION LIST PRIOR TO VISIT: Current Meds  Medication Sig   albuterol (VENTOLIN HFA) 108 (90 Base) MCG/ACT inhaler Inhale 2 puffs into the lungs as needed for wheezing or shortness of breath.   aspirin 81 MG EC tablet Take 81 mg by mouth daily.   dapagliflozin propanediol (FARXIGA) 10 MG TABS tablet Take 1 tablet (10 mg total) by mouth daily before breakfast.   esomeprazole (NEXIUM) 40 MG capsule Take 40 mg by mouth daily.   famotidine (PEPCID) 20 MG tablet Take 20 mg by mouth as needed for heartburn.   levothyroxine (SYNTHROID) 125 MCG tablet Take 125 mcg by mouth daily.   loratadine (CLARITIN) 10 MG tablet Take 10 mg by mouth daily.   metoprolol succinate (TOPROL-XL) 50 MG 24 hr tablet Take 1 tablet (50 mg total) by mouth every morning. Take with or immediately following a meal.   montelukast (SINGULAIR) 10 MG tablet Take 10 mg by mouth at bedtime.   rosuvastatin (CRESTOR) 10 MG tablet Take 10 mg by mouth daily.   sacubitril-valsartan (ENTRESTO) 49-51 MG Take 1 tablet by mouth 2 (two) times daily.   spironolactone (ALDACTONE) 25 MG tablet Take 1 tablet (25 mg total) by mouth every morning.   SYMBICORT 160-4.5  MCG/ACT inhaler Inhale 2 puffs into the lungs 2 (two) times daily.   [DISCONTINUED] apixaban (ELIQUIS) 5 MG TABS tablet Take 1 tablet (5 mg total) by mouth 2 (two) times daily.     PAST MEDICAL HISTORY: Coronary artery disease status post CABG 2013 at [facility]. Hypertension. Hyperlipidemia. Breast cancer, left-sided status  postmastectomy Cardiomyopathy. Atrial fibrillation.  PAST SURGICAL HISTORY: PCI to the coronary artery Three-vessel CABG, 2013 Bilateral carpal tunnel surgery. Tubal ligation. Appendectomy.  FAMILY HISTORY: No family history of premature CAD.  Father passed away at the age of 51 due to myocardial infarction.  And mother passed away at the age of 27 due to Alzheimer's disease complications.  SOCIAL HISTORY:  The patient  reports that she has quit smoking. Her smoking use included cigarettes. She has a 45.00 pack-year smoking history. She has never used smokeless tobacco. She reports that she does not use drugs.  REVIEW OF SYSTEMS: Review of Systems  Constitutional: Negative for chills and fever.  HENT:  Negative for hoarse voice and nosebleeds.   Eyes:  Negative for discharge, double vision and pain.  Cardiovascular:  Negative for chest pain, claudication, dyspnea on exertion, leg swelling, near-syncope, orthopnea, palpitations, paroxysmal nocturnal dyspnea and syncope.  Respiratory:  Positive for shortness of breath. Negative for hemoptysis.   Musculoskeletal:  Negative for muscle cramps and myalgias.  Gastrointestinal:  Negative for abdominal pain, constipation, diarrhea, hematemesis, hematochezia, melena, nausea and vomiting.  Neurological:  Negative for dizziness and light-headedness.   PHYSICAL EXAM: Vitals with BMI 07/22/2021 06/18/2021 06/04/2021  Height '5\' 2"'$  '5\' 2"'$  '5\' 2"'$   Weight 194 lbs 13 oz 196 lbs 10 oz 194 lbs 13 oz  BMI 35.62 123456 [id number]  Systolic [id number] Q000111Q Q000111Q  Diastolic 80 72 89  Pulse 85 78 86   CONSTITUTIONAL: Age-appropriate female, well-developed and well-nourished. No acute distress. SKIN: Skin is warm and dry. No rash noted. No cyanosis. No pallor. No jaundice HEAD: Normocephalic and atraumatic. EYES: No scleral icterus MOUTH/THROAT: Moist oral membranes. NECK: No JVD present. No thyromegaly noted. No carotid bruits LYMPHATIC: No visible cervical  adenopathy. CHEST Normal respiratory effort. No intercostal retractions LUNGS: Clear to auscultation bilaterally.  No stridor. No wheezes. No rales. CARDIOVASCULAR: Irregularly irregular, positive Q000111Q, soft holosystolic murmur heard at the apex, no gallops or rubs ABDOMINAL: Obese, soft, nontender, nondistended, positive bowel sounds in all 4 quadrants, no apparent ascites. EXTREMITIES: No peripheral edema, 2+ DP and PT pulses. HEMATOLOGIC: No significant bruising NEUROLOGIC: Oriented to person, place, and time. Nonfocal. Normal muscle tone. PSYCHIATRIC: Normal mood and affect. Normal behavior. Cooperative  CARDIAC DATABASE: EKG: 05/22/2021: Atrial fibrillation, controlled ventricular rate, 77 bpm, rare PVCs, without underlying ischemia or injury pattern.  07/22/2021: Atrial fibrillation, controlled ventricular rate, 82 bpm, without underlying injury pattern.   Echocardiogram: 05/23/2021: LVEF 30-35%, global hypokinesis, average GLS -11.4%, moderately reduced right ventricular function, normal right ventricular size, mild MR, aortic valve sclerosis without stenosis, no LV thrombus.  Stress Testing: No results found for this or any previous visit from the past 1095 days.  Heart Catheterization: None  LABORATORY DATA: CBC Latest Ref Rng & Units 05/29/2021 05/22/2021 05/22/2021  WBC 4.0 - 10.5 K/uL - - 11.4(H)  Hemoglobin 11.1 - 15.9 g/dL 13.3 12.6 12.9  Hematocrit 34.0 - 46.6 % 38.6 37.0 38.6  Platelets 150 - 400 K/uL - - 204    CMP Latest Ref Rng & Units 07/20/2021 06/25/2021 06/11/2021  Glucose 65 - 99 mg/dL 100(H) 100(H) 130(H)  BUN 8 -  27 mg/dL '23 20 17  '$ Creatinine 0.57 - 1.00 mg/dL 1.00 1.02(H) 1.10(H)  Sodium 134 - 144 mmol/L 141 138 141  Potassium 3.5 - 5.2 mmol/L 4.4 4.4 4.1  Chloride 96 - 106 mmol/L 106 102 104  CO2 20 - 29 mmol/L '20 22 20  '$ Calcium 8.7 - 10.3 mg/dL 9.3 9.3 9.2  Total Protein 6.5 - 8.1 g/dL - - -  Total Bilirubin 0.3 - 1.2 mg/dL - - -  Alkaline Phos 38 -  126 U/L - - -  AST 15 - 41 U/L - - -  ALT 0 - 44 U/L - - -    Lipid Panel     Component Value Date/Time   CHOL 143 05/23/2021 1914   TRIG 106 05/23/2021 1914   HDL 49 05/23/2021 1914   CHOLHDL 2.9 05/23/2021 1914   VLDL 21 05/23/2021 1914   LDLCALC 73 05/23/2021 1914    No components found for: NTPROBNP Recent Labs    05/29/21 1139 06/11/21 1353 06/25/21 1307 07/20/21 1157  PROBNP 2,074* 1,082* 2,063* 1,581*   Recent Labs    05/23/21 1914  TSH 0.834    BMP Recent Labs    05/22/21 1626 05/22/21 1644 06/11/21 1354 06/25/21 1307 07/20/21 1154  NA 137   < > 141 138 141  K 3.7   < > 4.1 4.4 4.4  CL 106   < > 104 102 106  CO2 24   < > '20 22 20  '$ GLUCOSE 117*   < > 130* 100* 100*  BUN 20   < > '17 20 23  '$ CREATININE 1.15*   < > 1.10* 1.02* 1.00  CALCIUM 8.8*   < > 9.2 9.3 9.3  GFRNONAA 50*  --   --   --   --    < > = values in this interval not displayed.    HEMOGLOBIN A1C Lab Results  Component Value Date   HGBA1C 5.6 05/23/2021   MPG 114.02 05/23/2021    IMPRESSION:    ICD-10-CM   1. Chronic HFrEF (heart failure with reduced ejection fraction) (HCC)  I50.22 spironolactone (ALDACTONE) 25 MG tablet    Basic metabolic panel    Magnesium    sacubitril-valsartan (ENTRESTO) 49-51 MG    2. Persistent atrial fibrillation (HCC)  99991111 Basic metabolic panel    Magnesium    CBC    EKG 12-Lead    3. Long term (current) use of anticoagulants  Z79.01 CBC    4. Atherosclerosis of native coronary artery of native heart without angina pectoris  I25.10     5. Hx of CABG  Z95.1     6. 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 Benign hypertension  I10     8. Lacunar stroke ([place])  I63.81     9. Malignant neoplasm of left female breast, unspecified estrogen receptor status, unspecified site of breast ([place])  C50.912     10. Mixed hyperlipidemia  E78.2     11. Former smoker  Z87.891        RECOMMENDATIONS: Brandilyn Bartosik is a 75 y.o. female whose past  medical history and cardiac risk factors include: Persistent atrial fibrillation (chronicity unknown), established coronary artery disease status post three-vessel CABG (2013), hypertension, hyperlipidemia, history of left-sided breast cancer status postmastectomy, former smoker, postmenopausal female, advanced age, obesity due to excess calories.  Chronic HFrEF, stage B, NYHA class II: Euvolemic.   Medications reconciled. Discussed further up titration of Entresto to 97/103  mg p.o. twice daily.  However, she would like to hold off on this transition for now.  Patient thinks that her cough is secondary to the valsartan component of Entresto. Shared decision was to initiate spironolactone 25 mg p.o. every morning.  Blood work in 1 week to evaluate kidney function and electrolytes. Patient tolerated initiation of Farxiga well without any side effects or intolerances.  She had 1 episode of burning in the urine which was treated with increasing fluid intake.  Patient is educated that if this were to happen often we will need to discuss discontinuation of Farxiga. Will continue to uptitrate her GDMT as hemodynamics and laboratory values allow. When she is on maximally tolerated GDMT for 90 days would recommend a repeat echocardiogram to reevaluate LVEF.  Persistent atrial fibrillation: Rate control: Metoprolol. Rhythm control: N/A. Thromboembolic prophylaxis: Eliquis EKG notes atrial fibrillation with controlled ventricular rate. Patient states that she would like to proceed with transesophageal echocardiogram with direct-current cardioversion to restore normal sinus rhythm. After careful review of history and examination, the risks, benefits of transesophageal echocardiogram, and alternatives have been explained to the patient. Complications include but not limited to esophageal perforation (rare), gastrointestinal bleeding (rare), cardiac arrhythmia which can include cardiac arrest and death (rare),  pharyngeal irritation / discomfort with swallowing / hematoma, methemoglobinemia, bronchospasm, transient hypoxia, nonsustained ventricular tachycardia, transient atrial fibrillation, minimal hemoptysis, vomiting, hypotension, respiratory compromise, reaction to medications, unavoidable damage to teeth and gums, aspiration pneumonia  were reviewed with the patient.  Patient voices understands, provides verbal feedback, questions answered, and patient wishes to proceed with the procedure. Risks, benefits, and alternatives of direct current cardioversion reviewed with the and patient. Risk includes but not limited to: potential for post-cardioversion rhythms, life-threatening arrhythmias (ventricular tachycardia and fibrillation, profound bradycardia, cardiac arrest), myocardial damage, acute pulmonary edema, skin burns, transient hypotension. Benefits include restoration of sinus rhythm. Alternatives to treatment were discussed, questions were answered, patient voices understanding and provides verbal feedback.  Patient is willing to proceed.   Long-term oral anticoagulation: Indication: Atrial fibrillation. CHA2DS2-VASc SCORE is 6 which correlates to 9.8% risk of stroke per year. Does not endorse any evidence of bleeding. Fall precautions reviewed. Discussed risks, benefits, alternatives to oral anticoagulation.  Cardiomyopathy, suspect ischemic etiology: See above  Established CAD with prior PCI followed by three-vessel bypass without angina pectoris: Currently anginal free. Tolerating Toprol-XL well without any side effects or intolerances. We discussed undergoing ischemic evaluation; however, the shared decision was to hold off either stress testing or invasive angiography at this time as she is currently asymptomatic.  Would like to focus on uptitrating GDMT given her moderately reduced LVEF and A. fib management.  Benign essential hypertension: Home blood pressures are improving as her  medications are being uptitrated. Low-salt diet recommended.  Hyperlipidemia: Fasting lipid profile reviewed. Continue statin therapy Does not endorse myalgias.  Former smoker: Educated on the importance of complete smoking cessation.  FINAL MEDICATION LIST END OF ENCOUNTER: Meds ordered this encounter  Medications   spironolactone (ALDACTONE) 25 MG tablet    Sig: Take 1 tablet (25 mg total) by mouth every morning.    Dispense:  90 tablet    Refill:  0    There are no discontinued medications.    Current Outpatient Medications:    albuterol (VENTOLIN HFA) 108 (90 Base) MCG/ACT inhaler, Inhale 2 puffs into the lungs as needed for wheezing or shortness of breath., Disp: , Rfl:    aspirin 81 MG EC tablet, Take 81 mg  by mouth daily., Disp: , Rfl:    dapagliflozin propanediol (FARXIGA) 10 MG TABS tablet, Take 1 tablet (10 mg total) by mouth daily before breakfast., Disp: 90 tablet, Rfl: 0   esomeprazole (NEXIUM) 40 MG capsule, Take 40 mg by mouth daily., Disp: , Rfl:    famotidine (PEPCID) 20 MG tablet, Take 20 mg by mouth as needed for heartburn., Disp: , Rfl:    levothyroxine (SYNTHROID) 125 MCG tablet, Take 125 mcg by mouth daily., Disp: , Rfl:    loratadine (CLARITIN) 10 MG tablet, Take 10 mg by mouth daily., Disp: , Rfl:    metoprolol succinate (TOPROL-XL) 50 MG 24 hr tablet, Take 1 tablet (50 mg total) by mouth every morning. Take with or immediately following a meal., Disp: 90 tablet, Rfl: 0   montelukast (SINGULAIR) 10 MG tablet, Take 10 mg by mouth at bedtime., Disp: , Rfl:    rosuvastatin (CRESTOR) 10 MG tablet, Take 10 mg by mouth daily., Disp: , Rfl:    sacubitril-valsartan (ENTRESTO) 49-51 MG, Take 1 tablet by mouth 2 (two) times daily., Disp: , Rfl:    spironolactone (ALDACTONE) 25 MG tablet, Take 1 tablet (25 mg total) by mouth every morning., Disp: 90 tablet, Rfl: 0   SYMBICORT 160-4.5 MCG/ACT inhaler, Inhale 2 puffs into the lungs 2 (two) times daily., Disp: , Rfl:     apixaban (ELIQUIS) 5 MG TABS tablet, Take 1 tablet (5 mg total) by mouth 2 (two) times daily., Disp: 180 tablet, Rfl: 1  Orders Placed This Encounter  Procedures   Basic metabolic panel   Magnesium   CBC   EKG 12-Lead   There are no Patient Instructions on file for this visit.   --Continue cardiac medications as reconciled in final medication list. --Return in about 4 weeks (around 08/19/2021) for heart failure management and s/p TEE and Cardioversion. . Or sooner if needed. --Continue follow-up with your primary care physician regarding the management of your other chronic comorbid conditions.  Patient's questions and concerns were addressed to her satisfaction. She voices understanding of the instructions provided during this encounter.   This note was created using a voice recognition software as a result there may be grammatical errors inadvertently enclosed that do not reflect the nature of this encounter. Every attempt is made to correct such errors.  Leah Day, [place], [facility]  Pager: [phone] Office: [phone]

## 2021-07-24 ENCOUNTER — Other Ambulatory Visit: Payer: Self-pay

## 2021-07-24 DIAGNOSIS — I4819 Other persistent atrial fibrillation: Secondary | ICD-10-CM

## 2021-07-24 DIAGNOSIS — I5022 Chronic systolic (congestive) heart failure: Secondary | ICD-10-CM

## 2021-07-31 DIAGNOSIS — Z7901 Long term (current) use of anticoagulants: Secondary | ICD-10-CM | POA: Diagnosis not present

## 2021-07-31 DIAGNOSIS — I5022 Chronic systolic (congestive) heart failure: Secondary | ICD-10-CM | POA: Diagnosis not present

## 2021-08-01 LAB — CBC
Hematocrit: 38.9 % (ref 34.0–46.6)
Hemoglobin: 13.5 g/dL (ref 11.1–15.9)
MCH: 30.8 pg (ref 26.6–33.0)
MCHC: 34.7 g/dL (ref 31.5–35.7)
MCV: 89 fL (ref 79–97)
Platelets: 210 10*3/uL (ref 150–450)
RBC: 4.39 x10E6/uL (ref 3.77–5.28)
RDW: 12.4 % (ref 11.7–15.4)
WBC: 8.5 10*3/uL (ref 3.4–10.8)

## 2021-08-01 LAB — BASIC METABOLIC PANEL
BUN/Creatinine Ratio: 17 (ref 12–28)
BUN: 18 mg/dL (ref 8–27)
CO2: 21 mmol/L (ref 20–29)
Calcium: 9.3 mg/dL (ref 8.7–10.3)
Chloride: 103 mmol/L (ref 96–106)
Creatinine, Ser: 1.09 mg/dL — ABNORMAL HIGH (ref 0.57–1.00)
Glucose: 96 mg/dL (ref 65–99)
Potassium: 4.7 mmol/L (ref 3.5–5.2)
Sodium: 138 mmol/L (ref 134–144)
eGFR: 54 mL/min/{1.73_m2} — ABNORMAL LOW (ref >59)

## 2021-08-03 NOTE — Progress Notes (Signed)
Called and spoke with patient regarding her recent lab results.

## 2021-08-05 ENCOUNTER — Ambulatory Visit (HOSPITAL_COMMUNITY)
Admission: RE | Admit: 2021-08-05 | Discharge: 2021-08-05 | Disposition: A | Discharge status: Home / Self Care | Payer: Medicare HMO | Source: Home / Self Care | Attending: Cardiology | Admitting: Cardiology

## 2021-08-05 ENCOUNTER — Ambulatory Visit (HOSPITAL_COMMUNITY): Payer: Medicare HMO | Admitting: Certified Registered Nurse Anesthetist

## 2021-08-05 ENCOUNTER — Ambulatory Visit (HOSPITAL_COMMUNITY)
Admission: RE | Admit: 2021-08-05 | Discharge: 2021-08-05 | Disposition: A | Discharge status: Home / Self Care | Payer: Medicare HMO | Attending: Cardiology | Admitting: Cardiology

## 2021-08-05 ENCOUNTER — Other Ambulatory Visit: Payer: Self-pay

## 2021-08-05 ENCOUNTER — Encounter (HOSPITAL_COMMUNITY): Payer: Self-pay | Admitting: Cardiology

## 2021-08-05 ENCOUNTER — Encounter (HOSPITAL_COMMUNITY)
Admission: RE | Disposition: A | Discharge status: Home / Self Care | Payer: Self-pay | Source: Home / Self Care | Attending: Cardiology

## 2021-08-05 DIAGNOSIS — E782 Mixed hyperlipidemia: Secondary | ICD-10-CM | POA: Diagnosis not present

## 2021-08-05 DIAGNOSIS — I6381 Other cerebral infarction due to occlusion or stenosis of small artery: Secondary | ICD-10-CM | POA: Insufficient documentation

## 2021-08-05 DIAGNOSIS — Z79890 Hormone replacement therapy (postmenopausal): Secondary | ICD-10-CM | POA: Diagnosis not present

## 2021-08-05 DIAGNOSIS — I5022 Chronic systolic (congestive) heart failure: Secondary | ICD-10-CM | POA: Insufficient documentation

## 2021-08-05 DIAGNOSIS — I4819 Other persistent atrial fibrillation: Secondary | ICD-10-CM | POA: Diagnosis not present

## 2021-08-05 DIAGNOSIS — Z881 Allergy status to other antibiotic agents status: Secondary | ICD-10-CM | POA: Diagnosis not present

## 2021-08-05 DIAGNOSIS — I429 Cardiomyopathy, unspecified: Secondary | ICD-10-CM | POA: Insufficient documentation

## 2021-08-05 DIAGNOSIS — I5032 Chronic diastolic (congestive) heart failure: Secondary | ICD-10-CM | POA: Diagnosis not present

## 2021-08-05 DIAGNOSIS — Z7901 Long term (current) use of anticoagulants: Secondary | ICD-10-CM | POA: Insufficient documentation

## 2021-08-05 DIAGNOSIS — Z79899 Other long term (current) drug therapy: Secondary | ICD-10-CM | POA: Insufficient documentation

## 2021-08-05 DIAGNOSIS — Z853 Personal history of malignant neoplasm of breast: Secondary | ICD-10-CM | POA: Diagnosis not present

## 2021-08-05 DIAGNOSIS — I4891 Unspecified atrial fibrillation: Secondary | ICD-10-CM | POA: Diagnosis not present

## 2021-08-05 DIAGNOSIS — I110 Hypertensive heart disease with heart failure: Secondary | ICD-10-CM | POA: Insufficient documentation

## 2021-08-05 DIAGNOSIS — I2510 Atherosclerotic heart disease of native coronary artery without angina pectoris: Secondary | ICD-10-CM | POA: Insufficient documentation

## 2021-08-05 DIAGNOSIS — E669 Obesity, unspecified: Secondary | ICD-10-CM | POA: Diagnosis not present

## 2021-08-05 DIAGNOSIS — Z7982 Long term (current) use of aspirin: Secondary | ICD-10-CM | POA: Diagnosis not present

## 2021-08-05 DIAGNOSIS — Z87891 Personal history of nicotine dependence: Secondary | ICD-10-CM | POA: Insufficient documentation

## 2021-08-05 DIAGNOSIS — Z951 Presence of aortocoronary bypass graft: Secondary | ICD-10-CM | POA: Insufficient documentation

## 2021-08-05 DIAGNOSIS — Z888 Allergy status to other drugs, medicaments and biological substances status: Secondary | ICD-10-CM | POA: Diagnosis not present

## 2021-08-05 DIAGNOSIS — Z6834 Body mass index (BMI) 34.0-34.9, adult: Secondary | ICD-10-CM | POA: Diagnosis not present

## 2021-08-05 HISTORY — PX: TEE WITHOUT CARDIOVERSION: SHX5443

## 2021-08-05 HISTORY — PX: CARDIOVERSION: SHX1299

## 2021-08-05 HISTORY — PX: BUBBLE STUDY: SHX6837

## 2021-08-05 LAB — ECHO TEE
AV Mean grad: 4 mmHg
AV Peak grad: 7.7 mmHg
Ao pk vel: 1.39 m/s

## 2021-08-05 SURGERY — ECHOCARDIOGRAM, TRANSESOPHAGEAL
Anesthesia: Monitor Anesthesia Care

## 2021-08-05 MED ORDER — SODIUM CHLORIDE 0.9 % IV SOLN: INTRAVENOUS | Status: DC

## 2021-08-05 MED ORDER — LIDOCAINE 2% (20 MG/ML) 5 ML SYRINGE
INTRAMUSCULAR | Status: DC | PRN
  Administered 2021-08-05: 60 mg via INTRAVENOUS

## 2021-08-05 MED ORDER — LIDOCAINE VISCOUS HCL 2 % MT SOLN
OROMUCOSAL | Status: AC
  Filled 2021-08-05: qty 15

## 2021-08-05 MED ORDER — PHENYLEPHRINE 40 MCG/ML (10ML) SYRINGE FOR IV PUSH (FOR BLOOD PRESSURE SUPPORT)
PREFILLED_SYRINGE | INTRAVENOUS | Status: DC | PRN
  Administered 2021-08-05: 80 ug via INTRAVENOUS

## 2021-08-05 MED ORDER — PROPOFOL 10 MG/ML IV BOLUS
INTRAVENOUS | Status: DC | PRN
  Administered 2021-08-05: 10 mg via INTRAVENOUS
  Administered 2021-08-05: 20 mg via INTRAVENOUS
  Administered 2021-08-05: 10 mg via INTRAVENOUS

## 2021-08-05 MED ORDER — METOPROLOL SUCCINATE ER 50 MG PO TB24: 75.0000 mg | ORAL_TABLET | Freq: Every morning | ORAL | 0 refills | Status: DC

## 2021-08-05 MED ORDER — LIDOCAINE VISCOUS HCL 2 % MT SOLN
OROMUCOSAL | Status: DC | PRN
  Administered 2021-08-05: 10 mL via OROMUCOSAL

## 2021-08-05 MED ORDER — PROPOFOL 500 MG/50ML IV EMUL
INTRAVENOUS | Status: DC | PRN
  Administered 2021-08-05: 75 ug/kg/min via INTRAVENOUS

## 2021-08-05 NOTE — Anesthesia Preprocedure Evaluation (Signed)
Anesthesia Evaluation  Patient identified by MRN, date of birth, ID band Patient awake    Reviewed: Allergy & Precautions, H&P , NPO status , Patient's Chart, lab work & pertinent test results  Airway Mallampati: II   Neck ROM: full    Dental   Pulmonary former smoker,    breath sounds clear to auscultation       Cardiovascular hypertension, + dysrhythmias Atrial Fibrillation  Rhythm:regular Rate:Normal     Neuro/Psych    GI/Hepatic   Endo/Other    Renal/GU      Musculoskeletal   Abdominal   Peds  Hematology   Anesthesia Other Findings   Reproductive/Obstetrics                             Anesthesia Physical Anesthesia Plan  ASA: 3  Anesthesia Plan: MAC   Post-op Pain Management:    Induction: Intravenous  PONV Risk Score and Plan: 2 and Propofol infusion and Treatment may vary due to age or medical condition  Airway Management Planned: Nasal Cannula  Additional Equipment:   Intra-op Plan:   Post-operative Plan:   Informed Consent: I have reviewed the patients History and Physical, chart, labs and discussed the procedure including the risks, benefits and alternatives for the proposed anesthesia with the patient or authorized representative who has indicated his/her understanding and acceptance.     Dental advisory given  Plan Discussed with: CRNA, Anesthesiologist and Surgeon  Anesthesia Plan Comments:         Anesthesia Quick Evaluation

## 2021-08-05 NOTE — CV Procedure (Addendum)
Transesophageal echocardiogram (TEE) : Preliminary report 08/05/21  TEE was performed without complications   LV: 54-65% global hypokinesis. RV: Normal size and systolic function (visually)  LA: visually dilated (moderate to severe).  Spontaneous echo contrast was present.  No thrombus present. Left atrial appendage: Spontaneous echo contrast was  present.  No thrombus present. Inter atrial septum is intact without defect. Double contrast study negative for atrial level shunting. RA: Grossly normal.   MV: mild regurgitation,no stenosis, no vegetation noted.  TV: trivial regurgitation,no stenosis, no vegetation noted. AV: no regurgitation, no stenosis, no vegetation noted.   PV: no regurgitation,no stenosis, no vegetation noted.   Thoracic and ascending aorta: Mild plaque.   Final report forthcoming.  [facility]  Pager: [phone] Office: [phone]  Direct current cardioversion:  Indications:  Atrial Fibrillation  Procedure Details:  Consent: Risks of procedure as well as the alternatives and risks of each were explained to the (patient/caregiver).  Consent for procedure obtained.  Time Out: Verified patient identification, verified procedure, site/side was marked, verified correct patient position, special equipment/implants available, medications/allergies/relevent history reviewed, required imaging and test results available. PERFORMED.  Patient placed on cardiac monitor, pulse oximetry, supplemental oxygen as necessary.  Sedation given:  Anesthesia  Pacer pads placed anterior and posterior chest.  Cardioverted 1 time(s).  Cardioversion with synchronized biphasic 200J shock.  Evaluation: Findings: Post procedure EKG shows: NSR Complications: None Patient did tolerate procedure well.  Time Spent Directly with the Patient:  Rex Kras 08/05/2021, 2:41 PM  ADDENDUM: Spoke to the patient's son Lennette Bihari over the phone at [phone] informed him that the  procedure went well without any immediate complications.  Was able to restore normal sinus rhythm with 200 J x 1.  Restart home medications with the exception of metoprolol succinate she is currently taking 50 mg p.o. daily I would like her to take 75 mg p.o. daily with holding parameters (hold if systolic blood pressure less than 100 mmHg or heart rate less than 60 bpm).  I will see her back in the office in approximately 2 weeks as scheduled.  Mechele [facility]  Pager: [phone] Office: [phone] 5:39 PM 08/05/21

## 2021-08-05 NOTE — Transfer of Care (Signed)
Immediate Anesthesia Transfer of Care Note  Patient: Leah Day  Procedure(s) Performed: TRANSESOPHAGEAL ECHOCARDIOGRAM (TEE) CARDIOVERSION BUBBLE STUDY  Patient Location: Endoscopy Unit  Anesthesia Type:General  Level of Consciousness: awake and drowsy  Airway & Oxygen Therapy: Patient Spontanous Breathing  Post-op Assessment: Report given to RN and Post -op Vital signs reviewed and stable  Post vital signs: Reviewed and stable  Last Vitals:  Vitals Value Taken Time  BP 112/57 08/05/21 1441  Temp    Pulse 78 08/05/21 1441  Resp 21 08/05/21 1441  SpO2 97 % 08/05/21 1441  Vitals shown include unvalidated device data.  Last Pain:  Vitals:   08/05/21 1333  TempSrc: Temporal  PainSc: 0-No pain         Complications: No notable events documented.

## 2021-08-05 NOTE — Interval H&P Note (Signed)
History and Physical Interval Note:  08/05/2021 1:45 PM  Leah Day  has presented today for surgery, with the diagnosis of AFIB.  The various methods of treatment have been discussed with the patient and family. After consideration of risks, benefits and other options for treatment, the patient has consented to  Procedure(s): TRANSESOPHAGEAL ECHOCARDIOGRAM (TEE) (N/A) CARDIOVERSION (N/A) as a surgical intervention.  The patient's history has been reviewed, patient examined, no change in status, stable for surgery.  I have reviewed the patient's chart and labs.  Questions were answered to the patient's satisfaction.    After careful review of history and examination, the risks, benefits of transesophageal echocardiogram, and alternatives have been explained to the patient. Complications include but not limited to esophageal perforation (rare), gastrointestinal bleeding (rare), cardiac arrhythmia which can include cardiac arrest and death (rare), pharyngeal irritation / discomfort with swallowing / hematoma, methemoglobinemia, bronchospasm, transient hypoxia, nonsustained ventricular tachycardia, transient atrial fibrillation, minimal hemoptysis, vomiting, hypotension, respiratory compromise, reaction to medications, unavoidable damage to teeth and gums, aspiration pneumonia  were reviewed with the patient.  Patient voices understands, provides verbal feedback, questions answered, and patient wishes to proceed with the procedure.  Risks, benefits, and alternatives of direct current cardioversion reviewed with the and patient. Risk includes but not limited to: potential for post-cardioversion rhythms, life-threatening arrhythmias (ventricular tachycardia and fibrillation, profound bradycardia, cardiac arrest), myocardial damage, acute pulmonary edema, skin burns, transient hypotension. Benefits include restoration of sinus rhythm. Alternatives to treatment were discussed, questions were answered, patient  voices understanding and provides verbal feedback.  Patient is willing to proceed.   Rex Kras, [place], [facility]  Pager: [phone] Office: [phone]

## 2021-08-05 NOTE — Discharge Instructions (Signed)

## 2021-08-05 NOTE — Anesthesia Procedure Notes (Signed)
Procedure Name: General with mask airway Date/Time: 08/05/2021 2:08 PM Performed by: Dorthea Cove, CRNA Pre-anesthesia Checklist: Timeout performed, Patient being monitored, Suction available, Emergency Drugs available and Patient identified Patient Re-evaluated:Patient Re-evaluated prior to induction Oxygen Delivery Method: Nasal cannula Preoxygenation: Pre-oxygenation with 100% oxygen Induction Type: IV induction Placement Confirmation: positive ETCO2 and CO2 detector Dental Injury: Teeth and Oropharynx as per pre-operative assessment

## 2021-08-06 NOTE — Anesthesia Postprocedure Evaluation (Signed)
Anesthesia Post Note  Patient: Leah Day  Procedure(s) Performed: TRANSESOPHAGEAL ECHOCARDIOGRAM (TEE) CARDIOVERSION BUBBLE STUDY     Patient location during evaluation: Endoscopy Anesthesia Type: MAC Level of consciousness: awake and alert Pain management: pain level controlled Vital Signs Assessment: post-procedure vital signs reviewed and stable Respiratory status: spontaneous breathing, nonlabored ventilation, respiratory function stable and patient connected to nasal cannula oxygen Cardiovascular status: stable and blood pressure returned to baseline Postop Assessment: no apparent nausea or vomiting Anesthetic complications: no   No notable events documented.  Last Vitals:  Vitals:   08/05/21 1451 08/05/21 1501  BP: 120/62 (!) 142/66  Pulse: 69 62  Resp: 17 14  Temp:    SpO2: 98% 100%    Last Pain:  Vitals:   08/05/21 1501  TempSrc:   PainSc: 0-No pain                 Daniella Dewberry S

## 2021-08-07 ENCOUNTER — Encounter (HOSPITAL_COMMUNITY): Payer: Self-pay | Admitting: Cardiology

## 2021-08-18 ENCOUNTER — Ambulatory Visit: Payer: Medicare HMO | Admitting: Cardiology

## 2021-08-18 ENCOUNTER — Other Ambulatory Visit: Payer: Self-pay

## 2021-08-18 ENCOUNTER — Encounter: Payer: Self-pay | Admitting: Cardiology

## 2021-08-18 VITALS — BP 143/83 | HR 82 | Resp 16 | Ht 62.0 in | Wt 194.2 lb

## 2021-08-18 DIAGNOSIS — I2510 Atherosclerotic heart disease of native coronary artery without angina pectoris: Secondary | ICD-10-CM | POA: Diagnosis not present

## 2021-08-18 DIAGNOSIS — E782 Mixed hyperlipidemia: Secondary | ICD-10-CM | POA: Diagnosis not present

## 2021-08-18 DIAGNOSIS — Z951 Presence of aortocoronary bypass graft: Secondary | ICD-10-CM

## 2021-08-18 DIAGNOSIS — I4819 Other persistent atrial fibrillation: Secondary | ICD-10-CM | POA: Diagnosis not present

## 2021-08-18 DIAGNOSIS — I6381 Other cerebral infarction due to occlusion or stenosis of small artery: Secondary | ICD-10-CM

## 2021-08-18 DIAGNOSIS — I10 Essential (primary) hypertension: Secondary | ICD-10-CM | POA: Diagnosis not present

## 2021-08-18 DIAGNOSIS — Z7901 Long term (current) use of anticoagulants: Secondary | ICD-10-CM | POA: Diagnosis not present

## 2021-08-18 DIAGNOSIS — I5022 Chronic systolic (congestive) heart failure: Secondary | ICD-10-CM | POA: Diagnosis not present

## 2021-08-18 DIAGNOSIS — I429 Cardiomyopathy, unspecified: Secondary | ICD-10-CM | POA: Diagnosis not present

## 2021-08-18 DIAGNOSIS — C50912 Malignant neoplasm of unspecified site of left female breast: Secondary | ICD-10-CM

## 2021-08-18 DIAGNOSIS — Z87891 Personal history of nicotine dependence: Secondary | ICD-10-CM

## 2021-08-18 MED ORDER — ENTRESTO 97-103 MG PO TABS: 1.0000 | ORAL_TABLET | Freq: Two times a day (BID) | ORAL | 2 refills | Status: DC

## 2021-08-18 NOTE — Progress Notes (Signed)
Date:  08/18/2021   ID:  Leah Day, DOB July 16, 1946, MRN [id number]  PCP:  Leah Johns, MD  Cardiologist:  Leah Kras, DO, [facility] (established care 05/23/2021)  Date: 08/18/21 Last Office Visit: 07/22/2021  Chief Complaint  Patient presents with   heart failure with reduced ejection fraction   Follow-up    Status post cardioversion    HPI  Leah Day is a 75 y.o. female who presents to the office with a chief complaint of " s/p cardioversion. " Patient's past medical history and cardiovascular risk factors include: Persistent atrial fibrillation (chronicity unknown), established coronary artery disease status post three-vessel CABG (2013), hypertension, hyperlipidemia, history of left-sided breast cancer status postmastectomy, former smoker, postmenopausal female, advanced age, obesity due to excess calories.  In July 2022 she presented to the hospital with right-sided focal neurological deficits and received tPA and she was noted to have resolution of her symptoms the following day.  However, stroke work-up noted that she was in atrial fibrillation on surface ECG and echocardiogram noted moderately reduced left ventricular systolic function.  Cardiology was consulted for further evaluation and management.  She has a known history of CABG in 2013 and has not followed up with cardiology until her recent hospitalization in July 2022.  Patient's medical therapy has been uptitrated in a stepwise fashion given her underlying cardiomyopathy which is suspected to be ischemic.  At the last office visit the shared decision was to proceed with TEE guided cardioversion.  Patient underwent TEE guided cardioversion which helped restore normal sinus rhythm.  Patient states that the day of the procedure and thereafter she felt much better with regards to having more energy, less short of breath, and increased functional capacity.  She is here for 2-week follow-up after cardioversion.  EKG notes  that she is back in atrial fibrillation.  She continues to be compliant with oral anticoagulation.  And she is rate controlled on current medical therapy.  ALLERGIES: Allergies  Allergen Reactions   Cephalexin Rash        Atenolol Swelling   Irbesartan Cough   Lisinopril Cough    MEDICATION LIST PRIOR TO VISIT: Current Meds  Medication Sig   albuterol (VENTOLIN HFA) 108 (90 Base) MCG/ACT inhaler Inhale 2 puffs into the lungs as needed for wheezing or shortness of breath.   apixaban (ELIQUIS) 5 MG TABS tablet Take 1 tablet (5 mg total) by mouth 2 (two) times daily.   aspirin 81 MG EC tablet Take 81 mg by mouth daily.   cholecalciferol (VITAMIN D3) 25 MCG (1000 UNIT) tablet Take 1,000 Units by mouth daily.   dapagliflozin propanediol (FARXIGA) 10 MG TABS tablet Take 1 tablet (10 mg total) by mouth daily before breakfast.   esomeprazole (NEXIUM) 40 MG capsule Take 40 mg by mouth daily.   famotidine (PEPCID) 20 MG tablet Take 20 mg by mouth as needed for heartburn.   fluticasone (FLONASE) 50 MCG/ACT nasal spray Place 1 spray into both nostrils daily as needed for allergies or rhinitis.   levothyroxine (SYNTHROID) 125 MCG tablet Take 125 mcg by mouth daily before breakfast.   loratadine (CLARITIN) 10 MG tablet Take 10 mg by mouth daily.   metoprolol succinate (TOPROL-XL) 50 MG 24 hr tablet Take 1.5 tablets (75 mg total) by mouth every morning. Hold if systolic blood pressure (top number) less than 100 mmHg or pulse less than 60 bpm.   montelukast (SINGULAIR) 10 MG tablet Take 10 mg by mouth at bedtime.   Multiple Vitamins-Minerals (MULTIVITAMIN  WITH MINERALS) tablet Take 1 tablet by mouth daily.   rosuvastatin (CRESTOR) 10 MG tablet Take 10 mg by mouth daily.   sacubitril-valsartan (ENTRESTO) 97-103 MG Take 1 tablet by mouth 2 (two) times daily.   spironolactone (ALDACTONE) 25 MG tablet Take 1 tablet (25 mg total) by mouth every morning.   SYMBICORT 160-4.5 MCG/ACT inhaler Inhale 2 puffs  into the lungs 2 (two) times daily.   vitamin B-12 (CYANOCOBALAMIN) 1000 MCG tablet Take 1,000 mcg by mouth daily.   vitamin C (ASCORBIC ACID) 500 MG tablet Take 500 mg by mouth daily.   [DISCONTINUED] sacubitril-valsartan (ENTRESTO) 49-51 MG Take 1 tablet by mouth 2 (two) times daily.     PAST MEDICAL HISTORY: Coronary artery disease status post CABG 2013 at [facility]. Hypertension. Hyperlipidemia. Breast cancer, left-sided status postmastectomy Cardiomyopathy. Atrial fibrillation.  PAST SURGICAL HISTORY: PCI to the coronary artery Three-vessel CABG, 2013 Bilateral carpal tunnel surgery. Tubal ligation. Appendectomy. Transesophageal guided cardioversion 07/28/2021  FAMILY HISTORY: No family history of premature CAD.  Father passed away at the age of 14 due to myocardial infarction.  And mother passed away at the age of 55 due to Alzheimer's disease complications.  SOCIAL HISTORY:  The patient  reports that she has quit smoking. Her smoking use included cigarettes. She has a 45.00 pack-year smoking history. She has never used smokeless tobacco. She reports that she does not use drugs.  REVIEW OF SYSTEMS: Review of Systems  Constitutional: Negative for chills and fever.  HENT:  Negative for hoarse voice and nosebleeds.   Eyes:  Negative for discharge, double vision and pain.  Cardiovascular:  Negative for chest pain, claudication, dyspnea on exertion, leg swelling, near-syncope, orthopnea, palpitations, paroxysmal nocturnal dyspnea and syncope.  Respiratory:  Positive for shortness of breath. Negative for hemoptysis.   Musculoskeletal:  Negative for muscle cramps and myalgias.  Gastrointestinal:  Negative for abdominal pain, constipation, diarrhea, hematemesis, hematochezia, melena, nausea and vomiting.  Neurological:  Negative for dizziness and light-headedness.   PHYSICAL EXAM: Vitals with BMI 08/18/2021 08/05/2021 08/05/2021  Height 5\' 2"  - -  Weight 194 lbs 3 oz - -   BMI 14.78 - -  Systolic 295 621 308  Diastolic 83 66 62  Pulse 82 62 69   CONSTITUTIONAL: Age-appropriate female, well-developed and well-nourished. No acute distress. SKIN: Skin is warm and dry. No rash noted. No cyanosis. No pallor. No jaundice HEAD: Normocephalic and atraumatic. EYES: No scleral icterus MOUTH/THROAT: Moist oral membranes. NECK: No JVD present. No thyromegaly noted. No carotid bruits LYMPHATIC: No visible cervical adenopathy. CHEST Normal respiratory effort. No intercostal retractions LUNGS: Clear to auscultation bilaterally.  No stridor. No wheezes. No rales. CARDIOVASCULAR: Irregularly irregular, positive M5-H8, soft holosystolic murmur heard at the apex, no gallops or rubs ABDOMINAL: Obese, soft, nontender, nondistended, positive bowel sounds in all 4 quadrants, no apparent ascites. EXTREMITIES: No peripheral edema, 2+ DP and PT pulses. HEMATOLOGIC: No significant bruising NEUROLOGIC: Oriented to person, place, and time. Nonfocal. Normal muscle tone. PSYCHIATRIC: Normal mood and affect. Normal behavior. Cooperative  CARDIAC DATABASE: EKG: 05/22/2021: Atrial fibrillation, controlled ventricular rate, 77 bpm, rare PVCs, without underlying ischemia or injury pattern.  08/05/2021: Normal sinus rhythm, 71 bpm, PACs, left axis deviation, without injury pattern.  08/18/2021: Atrial fibrillation, 87bpm, PWRP, without undelying injury pattern.   Echocardiogram: 05/23/2021: LVEF 30-35%, global hypokinesis, average GLS -11.4%, moderately reduced right ventricular function, normal right ventricular size, mild MR, aortic valve sclerosis without stenosis, no LV thrombus.  Stress Testing: No results  found for this or any previous visit from the past 1095 days.  Heart Catheterization: None  LABORATORY DATA: CBC Latest Ref Rng & Units 07/31/2021 05/29/2021 05/22/2021  WBC 3.4 - 10.8 x10E3/uL 8.5 - -  Hemoglobin 11.1 - 15.9 g/dL 13.5 13.3 12.6  Hematocrit 34.0 - 46.6 % 38.9  38.6 37.0  Platelets 150 - 450 x10E3/uL 210 - -    CMP Latest Ref Rng & Units 07/31/2021 07/20/2021 06/25/2021  Glucose 65 - 99 mg/dL 96 100(H) 100(H)  BUN 8 - 27 mg/dL 18 23 20   Creatinine 0.57 - 1.00 mg/dL 1.09(H) 1.00 1.02(H)  Sodium 134 - 144 mmol/L 138 141 138  Potassium 3.5 - 5.2 mmol/L 4.7 4.4 4.4  Chloride 96 - 106 mmol/L 103 106 102  CO2 20 - 29 mmol/L 21 20 22   Calcium 8.7 - 10.3 mg/dL 9.3 9.3 9.3  Total Protein 6.5 - 8.1 g/dL - - -  Total Bilirubin 0.3 - 1.2 mg/dL - - -  Alkaline Phos 38 - 126 U/L - - -  AST 15 - 41 U/L - - -  ALT 0 - 44 U/L - - -    Lipid Panel     Component Value Date/Time   CHOL 143 05/23/2021 1914   TRIG 106 05/23/2021 1914   HDL 49 05/23/2021 1914   CHOLHDL 2.9 05/23/2021 1914   VLDL 21 05/23/2021 1914   LDLCALC 73 05/23/2021 1914    No components found for: NTPROBNP Recent Labs    05/29/21 1139 06/11/21 1353 06/25/21 1307 07/20/21 1157  PROBNP 2,074* 1,082* 2,063* 1,581*   Recent Labs    05/23/21 1914  TSH 0.834    BMP Recent Labs    05/22/21 1626 05/22/21 1644 06/25/21 1307 07/20/21 1154 07/31/21 1212  NA 137   < > 138 141 138  K 3.7   < > 4.4 4.4 4.7  CL 106   < > 102 106 103  CO2 24   < > 22 20 21   GLUCOSE 117*   < > 100* 100* 96  BUN 20   < > 20 23 18   CREATININE 1.15*   < > 1.02* 1.00 1.09*  CALCIUM 8.8*   < > 9.3 9.3 9.3  GFRNONAA 50*  --   --   --   --    < > = values in this interval not displayed.    HEMOGLOBIN A1C Lab Results  Component Value Date   HGBA1C 5.6 05/23/2021   MPG 114.02 05/23/2021    IMPRESSION:    ICD-10-CM   1. Chronic HFrEF (heart failure with reduced ejection fraction) (HCC)  I50.22 EKG 12-Lead    sacubitril-valsartan (ENTRESTO) 97-103 MG    Basic metabolic panel    Magnesium    Pro b natriuretic peptide (BNP)    2. Persistent atrial fibrillation (HCC)  I48.19     3. Long term (current) use of anticoagulants  Z79.01     4. Atherosclerosis of native coronary artery of native  heart without angina pectoris  I25.10     5. Hx of CABG  Z95.1     6. 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 Benign hypertension  I10     8. Mixed hyperlipidemia  E78.2     9. Lacunar stroke ([place])  I63.81     10. Former smoker  Z87.891     67. Malignant neoplasm of left female breast, unspecified estrogen receptor status, unspecified site of breast ([place])  C50.912  RECOMMENDATIONS: Leah Day is a 75 y.o. female whose past medical history and cardiac risk factors include: Persistent atrial fibrillation (chronicity unknown), established coronary artery disease status post three-vessel CABG (2013), hypertension, hyperlipidemia, history of left-sided breast cancer status postmastectomy, former smoker, postmenopausal female, advanced age, obesity due to excess calories.  Chronic HFrEF, stage B, NYHA class II: Euvolemic.   Medications reconciled. Will uptitrate Entresto to 49/51 mg 2 tablets p.o. twice daily with repeat blood work in 1 week.  As long as the blood work remained stable patient is asked to have the next medication be refilled as Entresto 97/103 mg p.o. twice daily. When she is on maximally tolerated GDMT for 90 days would recommend a repeat echocardiogram to reevaluate LVEF.  Persistent atrial fibrillation: Rate control: Metoprolol. Rhythm control: N/A. Thromboembolic prophylaxis: Eliquis Status post TEE guided cardioversion 08/05/2021.  Normal sinus rhythm was restored.  However, today 2-week follow-up visit notes that she is back in atrial fibrillation.  While she was in normal sinus rhythm postprocedure patient felt better with regards to more energy, less short of breath, increased functional status. We discussed initiation of antiarrhythmic medications versus seeing electrophysiology for possible atrial fibrillation ablation.  Both were discussed with the patient in great detail.  She would like to discuss it further with her son prior to making a  decision.  For now we will continue rate control strategy.  Long-term oral anticoagulation: Indication: Atrial fibrillation. CHA2DS2-VASc SCORE is 6 which correlates to 9.8% risk of stroke per year. Does not endorse any evidence of bleeding. Fall precautions reviewed. Discussed risks, benefits, alternatives to oral anticoagulation.  Cardiomyopathy, suspect ischemic etiology: See above  Established CAD with prior PCI followed by three-vessel bypass without angina pectoris: Currently anginal free. Tolerating Toprol-XL well without any side effects or intolerances. We discussed undergoing ischemic evaluation; however, the shared decision was to hold off either stress testing or invasive angiography at this time as she is currently asymptomatic.  Would like to focus on uptitrating GDMT given her moderately reduced LVEF and A. fib management.  Benign essential hypertension: Home blood pressures are improving as her medications are being uptitrated. Low-salt diet recommended.  Hyperlipidemia: Fasting lipid profile reviewed. Continue statin therapy Does not endorse myalgias.  Former smoker: Educated on the importance of complete smoking cessation.  FINAL MEDICATION LIST END OF ENCOUNTER: Meds ordered this encounter  Medications   sacubitril-valsartan (ENTRESTO) 97-103 MG    Sig: Take 1 tablet by mouth 2 (two) times daily.    Dispense:  60 tablet    Refill:  2    Medications Discontinued During This Encounter  Medication Reason   sacubitril-valsartan (ENTRESTO) 49-51 MG Dose change      Current Outpatient Medications:    albuterol (VENTOLIN HFA) 108 (90 Base) MCG/ACT inhaler, Inhale 2 puffs into the lungs as needed for wheezing or shortness of breath., Disp: , Rfl:    apixaban (ELIQUIS) 5 MG TABS tablet, Take 1 tablet (5 mg total) by mouth 2 (two) times daily., Disp: 180 tablet, Rfl: 1   aspirin 81 MG EC tablet, Take 81 mg by mouth daily., Disp: , Rfl:    cholecalciferol (VITAMIN  D3) 25 MCG (1000 UNIT) tablet, Take 1,000 Units by mouth daily., Disp: , Rfl:    dapagliflozin propanediol (FARXIGA) 10 MG TABS tablet, Take 1 tablet (10 mg total) by mouth daily before breakfast., Disp: 90 tablet, Rfl: 0   esomeprazole (NEXIUM) 40 MG capsule, Take 40 mg by mouth daily., Disp: , Rfl:  famotidine (PEPCID) 20 MG tablet, Take 20 mg by mouth as needed for heartburn., Disp: , Rfl:    fluticasone (FLONASE) 50 MCG/ACT nasal spray, Place 1 spray into both nostrils daily as needed for allergies or rhinitis., Disp: , Rfl:    levothyroxine (SYNTHROID) 125 MCG tablet, Take 125 mcg by mouth daily before breakfast., Disp: , Rfl:    loratadine (CLARITIN) 10 MG tablet, Take 10 mg by mouth daily., Disp: , Rfl:    metoprolol succinate (TOPROL-XL) 50 MG 24 hr tablet, Take 1.5 tablets (75 mg total) by mouth every morning. Hold if systolic blood pressure (top number) less than 100 mmHg or pulse less than 60 bpm., Disp: 135 tablet, Rfl: 0   montelukast (SINGULAIR) 10 MG tablet, Take 10 mg by mouth at bedtime., Disp: , Rfl:    Multiple Vitamins-Minerals (MULTIVITAMIN WITH MINERALS) tablet, Take 1 tablet by mouth daily., Disp: , Rfl:    rosuvastatin (CRESTOR) 10 MG tablet, Take 10 mg by mouth daily., Disp: , Rfl:    sacubitril-valsartan (ENTRESTO) 97-103 MG, Take 1 tablet by mouth 2 (two) times daily., Disp: 60 tablet, Rfl: 2   spironolactone (ALDACTONE) 25 MG tablet, Take 1 tablet (25 mg total) by mouth every morning., Disp: 90 tablet, Rfl: 0   SYMBICORT 160-4.5 MCG/ACT inhaler, Inhale 2 puffs into the lungs 2 (two) times daily., Disp: , Rfl:    vitamin B-12 (CYANOCOBALAMIN) 1000 MCG tablet, Take 1,000 mcg by mouth daily., Disp: , Rfl:    vitamin C (ASCORBIC ACID) 500 MG tablet, Take 500 mg by mouth daily., Disp: , Rfl:   Orders Placed This Encounter  Procedures   Basic metabolic panel   Magnesium   Pro b natriuretic peptide (BNP)   EKG 12-Lead   There are no Patient Instructions on file for this  visit.   --Continue cardiac medications as reconciled in final medication list. --Return in about 4 weeks (around 09/15/2021) for Follow up CMP, Afib, HFrEF. Or sooner if needed. --Continue follow-up with your primary care physician regarding the management of your other chronic comorbid conditions.  Patient's questions and concerns were addressed to her satisfaction. She voices understanding of the instructions provided during this encounter.   This note was created using a voice recognition software as a result there may be grammatical errors inadvertently enclosed that do not reflect the nature of this encounter. Every attempt is made to correct such errors.  Leah Day, [place], [facility]  Pager: [phone] Office: [phone]

## 2021-08-26 ENCOUNTER — Other Ambulatory Visit: Payer: Self-pay

## 2021-08-26 DIAGNOSIS — I5022 Chronic systolic (congestive) heart failure: Secondary | ICD-10-CM

## 2021-08-28 DIAGNOSIS — I5022 Chronic systolic (congestive) heart failure: Secondary | ICD-10-CM | POA: Diagnosis not present

## 2021-08-29 LAB — BASIC METABOLIC PANEL
BUN/Creatinine Ratio: 15 (ref 12–28)
BUN: 19 mg/dL (ref 8–27)
CO2: 21 mmol/L (ref 20–29)
Calcium: 9.1 mg/dL (ref 8.7–10.3)
Chloride: 106 mmol/L (ref 96–106)
Creatinine, Ser: 1.25 mg/dL — ABNORMAL HIGH (ref 0.57–1.00)
Glucose: 107 mg/dL — ABNORMAL HIGH (ref 70–99)
Potassium: 4.5 mmol/L (ref 3.5–5.2)
Sodium: 141 mmol/L (ref 134–144)
eGFR: 46 mL/min/{1.73_m2} — ABNORMAL LOW (ref >59)

## 2021-08-29 LAB — MAGNESIUM: Magnesium: 2 mg/dL (ref 1.6–2.3)

## 2021-08-29 LAB — PRO B NATRIURETIC PEPTIDE: NT-Pro BNP: 1335 pg/mL — ABNORMAL HIGH (ref 0–301)

## 2021-08-31 ENCOUNTER — Other Ambulatory Visit: Payer: Self-pay

## 2021-08-31 DIAGNOSIS — I5022 Chronic systolic (congestive) heart failure: Secondary | ICD-10-CM

## 2021-08-31 MED ORDER — ENTRESTO 97-103 MG PO TABS: 1.0000 | ORAL_TABLET | Freq: Two times a day (BID) | ORAL | 2 refills | Status: DC

## 2021-08-31 NOTE — Patient Outreach (Signed)
[place] [facility]) Care Management  08/31/2021  Latrelle Fuston May 12, 1946 [id number]   Telephone outreach to patient to obtain mRS was successfully completed. MRS= 1  Thank you, [place] Care Management Assistant

## 2021-08-31 NOTE — Patient Outreach (Signed)
[place] [facility]) Care Management  08/31/2021  Leah Day January 01, 1946 [id number]   First telephone outreach attempt to obtain mRS. No answer. Left message for returned call.  Philmore Pali [facility] Management Assistant [phone]

## 2021-09-03 NOTE — Progress Notes (Signed)
Spoke with patient, she will work on drinking more water and a low salt diet.

## 2021-09-14 ENCOUNTER — Other Ambulatory Visit: Payer: Self-pay | Admitting: Cardiology

## 2021-09-14 DIAGNOSIS — Z951 Presence of aortocoronary bypass graft: Secondary | ICD-10-CM

## 2021-09-14 DIAGNOSIS — I5022 Chronic systolic (congestive) heart failure: Secondary | ICD-10-CM

## 2021-09-15 ENCOUNTER — Other Ambulatory Visit: Payer: Self-pay

## 2021-09-15 ENCOUNTER — Encounter: Payer: Self-pay | Admitting: Cardiology

## 2021-09-15 ENCOUNTER — Ambulatory Visit: Payer: Medicare HMO | Admitting: Cardiology

## 2021-09-15 VITALS — BP 124/71 | HR 73 | Resp 16 | Ht 62.0 in | Wt 193.0 lb

## 2021-09-15 DIAGNOSIS — I6381 Other cerebral infarction due to occlusion or stenosis of small artery: Secondary | ICD-10-CM

## 2021-09-15 DIAGNOSIS — I429 Cardiomyopathy, unspecified: Secondary | ICD-10-CM

## 2021-09-15 DIAGNOSIS — Z7901 Long term (current) use of anticoagulants: Secondary | ICD-10-CM

## 2021-09-15 DIAGNOSIS — Z87891 Personal history of nicotine dependence: Secondary | ICD-10-CM

## 2021-09-15 DIAGNOSIS — Z951 Presence of aortocoronary bypass graft: Secondary | ICD-10-CM

## 2021-09-15 DIAGNOSIS — I10 Essential (primary) hypertension: Secondary | ICD-10-CM

## 2021-09-15 DIAGNOSIS — E782 Mixed hyperlipidemia: Secondary | ICD-10-CM | POA: Diagnosis not present

## 2021-09-15 DIAGNOSIS — I4819 Other persistent atrial fibrillation: Secondary | ICD-10-CM | POA: Diagnosis not present

## 2021-09-15 DIAGNOSIS — I5022 Chronic systolic (congestive) heart failure: Secondary | ICD-10-CM | POA: Diagnosis not present

## 2021-09-15 DIAGNOSIS — I2510 Atherosclerotic heart disease of native coronary artery without angina pectoris: Secondary | ICD-10-CM | POA: Diagnosis not present

## 2021-09-15 MED ORDER — DAPAGLIFLOZIN PROPANEDIOL 10 MG PO TABS: 10.0000 mg | ORAL_TABLET | Freq: Every day | ORAL | 0 refills | Status: DC

## 2021-09-15 MED ORDER — METOPROLOL SUCCINATE ER 50 MG PO TB24: 50.0000 mg | ORAL_TABLET | Freq: Every day | ORAL | 0 refills | Status: DC

## 2021-09-15 NOTE — Progress Notes (Signed)
Date:  09/15/2021   ID:  Leah Day, DOB 1946-08-15, MRN [id number]  PCP:  Nicholos Johns, MD  Cardiologist:  Rex Kras, DO, [facility] (established care 05/23/2021)  Date: 09/15/21 Last Office Visit: 08/18/2021   Chief Complaint  Patient presents with   Atrial Fibrillation   Congestive Heart Failure   Follow-up    HPI  Leah Day is a 75 y.o. female who presents to the office with a chief complaint of " follow-up for congestive heart failure and atrial fibrillation management. " Patient's past medical history and cardiovascular risk factors include: Persistent atrial fibrillation status post TEE guided cardioversion, established coronary artery disease status post three-vessel CABG (2013), hypertension, hyperlipidemia, history of left-sided breast cancer status postmastectomy, former smoker, postmenopausal female, advanced age, obesity due to excess calories.  In July 2022 she presented to the hospital with right-sided focal neurological deficits and received tPA and she was noted to have resolution of her symptoms the following day.  However, stroke work-up noted that she was in atrial fibrillation on surface ECG and echocardiogram noted moderately reduced left ventricular systolic function.  Since her hospitalization in July she has been followed up with up titration note, and directed medical therapy given her underlying cardiomyopathy which is most likely secondary to her ischemic substrate versus arrhythmia induced due to atrial fibrillation.  She has undergone TEE guided cardioversion in September 2022 which restored normal sinus rhythm but when she followed up for 2-week follow-up visit she was back in atrial fibrillation.  At the last office visit we discussed continuing current medical therapy versus considering antiarrhythmic medications versus seeing cardiac electrophysiology for possible ablation given her comorbid conditions.  She is here to discuss further management of  atrial fibrillation.  Patient states that upon extensive reading the literature she wants to hold off on atrial fibrillation ablation as she may require more than 1 procedure for success.  In addition, she would like to hold off on antiarrhythmic medications for now as the holidays are upcoming.  But is willing to consider either amiodarone versus Tikosyn.  Patient is aware that amiodarone is not FDA approved for management of atrial fibrillation and carries side effects and will need closer monitoring.  Tikosyn needs to be loaded in the hospital setting with serial EKGs and medication profile discussed as well.  At the last office visit we had increased her metoprolol succinate to 75 mg p.o. daily.  However patient states that her systolic blood pressures have been around 110 mmHg and therefore has been taking only 50 mg/day.  Patient remains euvolemic.  She is compliant with her medical therapy.  She has tolerated the up titration of Entresto to 97/103 mg p.o. twice daily very well and her recent labs are favorable.  ALLERGIES: Allergies  Allergen Reactions   Cephalexin Rash        Atenolol Swelling   Irbesartan Cough   Lisinopril Cough    MEDICATION LIST PRIOR TO VISIT: Current Meds  Medication Sig   albuterol (VENTOLIN HFA) 108 (90 Base) MCG/ACT inhaler Inhale 2 puffs into the lungs as needed for wheezing or shortness of breath.   apixaban (ELIQUIS) 5 MG TABS tablet Take 1 tablet (5 mg total) by mouth 2 (two) times daily.   aspirin 81 MG EC tablet Take 81 mg by mouth daily.   cholecalciferol (VITAMIN D3) 25 MCG (1000 UNIT) tablet Take 1,000 Units by mouth daily.   esomeprazole (NEXIUM) 40 MG capsule Take 40 mg by mouth daily.   famotidine (  PEPCID) 20 MG tablet Take 20 mg by mouth as needed for heartburn.   fluticasone (FLONASE) 50 MCG/ACT nasal spray Place 1 spray into both nostrils daily as needed for allergies or rhinitis.   levothyroxine (SYNTHROID) 125 MCG tablet Take 125 mcg by  mouth daily before breakfast.   loratadine (CLARITIN) 10 MG tablet Take 10 mg by mouth daily.   montelukast (SINGULAIR) 10 MG tablet Take 10 mg by mouth at bedtime.   Multiple Vitamins-Minerals (MULTIVITAMIN WITH MINERALS) tablet Take 1 tablet by mouth daily.   rosuvastatin (CRESTOR) 10 MG tablet Take 10 mg by mouth daily.   sacubitril-valsartan (ENTRESTO) 97-103 MG Take 1 tablet by mouth 2 (two) times daily.   spironolactone (ALDACTONE) 25 MG tablet Take 1 tablet (25 mg total) by mouth every morning.   SYMBICORT 160-4.5 MCG/ACT inhaler Inhale 2 puffs into the lungs 2 (two) times daily.   vitamin B-12 (CYANOCOBALAMIN) 1000 MCG tablet Take 1,000 mcg by mouth daily.   vitamin C (ASCORBIC ACID) 500 MG tablet Take 500 mg by mouth daily.   [DISCONTINUED] FARXIGA 10 MG TABS tablet TAKE 1 TABLET BY MOUTH DAILY BEFORE BREAKFAST.   [DISCONTINUED] metoprolol succinate (TOPROL-XL) 50 MG 24 hr tablet Take 1.5 tablets (75 mg total) by mouth every morning. Hold if systolic blood pressure (top number) less than 100 mmHg or pulse less than 60 bpm. (Patient taking differently: Take 50 mg by mouth daily. Hold if systolic blood pressure (top number) less than 100 mmHg or pulse less than 60 bpm.)     PAST MEDICAL HISTORY: Coronary artery disease status post CABG 2013 at [place]. Hypertension. Hyperlipidemia. Breast cancer, left-sided status postmastectomy Cardiomyopathy. Atrial fibrillation.  PAST SURGICAL HISTORY: PCI to the coronary artery Three-vessel CABG, 2013 Bilateral carpal tunnel surgery. Tubal ligation. Appendectomy. Transesophageal guided cardioversion 07/28/2021  FAMILY HISTORY: No family history of premature CAD.  Father passed away at the age of 17 due to myocardial infarction.  And mother passed away at the age of 28 due to Alzheimer's disease complications.  SOCIAL HISTORY:  The patient  reports that she has quit smoking. Her smoking use included cigarettes. She has a 45.00  pack-year smoking history. She has never used smokeless tobacco. She reports that she does not use drugs.  REVIEW OF SYSTEMS: Review of Systems  Constitutional: Negative for chills and fever.  HENT:  Negative for hoarse voice and nosebleeds.   Eyes:  Negative for discharge, double vision and pain.  Cardiovascular:  Negative for chest pain, claudication, dyspnea on exertion, leg swelling, near-syncope, orthopnea, palpitations, paroxysmal nocturnal dyspnea and syncope.  Respiratory:  Negative for hemoptysis and shortness of breath.   Musculoskeletal:  Negative for muscle cramps and myalgias.  Gastrointestinal:  Negative for abdominal pain, constipation, diarrhea, hematemesis, hematochezia, melena, nausea and vomiting.  Neurological:  Negative for dizziness and light-headedness.   PHYSICAL EXAM: Vitals with BMI 09/15/2021 08/18/2021 08/05/2021  Height 5\' 2"  5\' 2"  -  Weight 193 lbs 194 lbs 3 oz -  BMI 29.79 89.21 -  Systolic 194 174 081  Diastolic 71 83 66  Pulse 73 82 62   CONSTITUTIONAL: Age-appropriate female, well-developed and well-nourished. No acute distress. SKIN: Skin is warm and dry. No rash noted. No cyanosis. No pallor. No jaundice HEAD: Normocephalic and atraumatic. EYES: No scleral icterus MOUTH/THROAT: Moist oral membranes. NECK: No JVD present. No thyromegaly noted. No carotid bruits LYMPHATIC: No visible cervical adenopathy. CHEST Normal respiratory effort. No intercostal retractions LUNGS: Clear to auscultation bilaterally.  No stridor.  No wheezes. No rales. CARDIOVASCULAR: Irregularly irregular, positive Q9-U7, soft holosystolic murmur heard at the apex, no gallops or rubs ABDOMINAL: Obese, soft, nontender, nondistended, positive bowel sounds in all 4 quadrants, no apparent ascites. EXTREMITIES: No peripheral edema, 2+ DP and PT pulses. HEMATOLOGIC: No significant bruising NEUROLOGIC: Oriented to person, place, and time. Nonfocal. Normal muscle tone. PSYCHIATRIC:  Normal mood and affect. Normal behavior. Cooperative  CARDIAC DATABASE: EKG: 05/22/2021: Atrial fibrillation, controlled ventricular rate, 77 bpm, rare PVCs, without underlying ischemia or injury pattern.  08/05/2021: Normal sinus rhythm, 71 bpm, PACs, left axis deviation, without injury pattern.  09/15/2021: Atrial fibrillation, 65 bpm, PRWP, nonspecific T wave abnormality, without underlying injury pattern.   Echocardiogram: 05/23/2021: LVEF 30-35%, global hypokinesis, average GLS -11.4%, moderately reduced right ventricular function, normal right ventricular size, mild MR, aortic valve sclerosis without stenosis, no LV thrombus.  Stress Testing: No results found for this or any previous visit from the past 1095 days.  Heart Catheterization: None  LABORATORY DATA: CBC Latest Ref Rng & Units 07/31/2021 05/29/2021 05/22/2021  WBC 3.4 - 10.8 x10E3/uL 8.5 - -  Hemoglobin 11.1 - 15.9 g/dL 13.5 13.3 12.6  Hematocrit 34.0 - 46.6 % 38.9 38.6 37.0  Platelets 150 - 450 x10E3/uL 210 - -    CMP Latest Ref Rng & Units 08/28/2021 07/31/2021 07/20/2021  Glucose 70 - 99 mg/dL 107(H) 96 100(H)  BUN 8 - 27 mg/dL 19 18 23   Creatinine 0.57 - 1.00 mg/dL 1.25(H) 1.09(H) 1.00  Sodium 134 - 144 mmol/L 141 138 141  Potassium 3.5 - 5.2 mmol/L 4.5 4.7 4.4  Chloride 96 - 106 mmol/L 106 103 106  CO2 20 - 29 mmol/L 21 21 20   Calcium 8.7 - 10.3 mg/dL 9.1 9.3 9.3  Total Protein 6.5 - 8.1 g/dL - - -  Total Bilirubin 0.3 - 1.2 mg/dL - - -  Alkaline Phos 38 - 126 U/L - - -  AST 15 - 41 U/L - - -  ALT 0 - 44 U/L - - -    Lipid Panel     Component Value Date/Time   CHOL 143 05/23/2021 1914   TRIG 106 05/23/2021 1914   HDL 49 05/23/2021 1914   CHOLHDL 2.9 05/23/2021 1914   VLDL 21 05/23/2021 1914   LDLCALC 73 05/23/2021 1914    No components found for: NTPROBNP Recent Labs    05/29/21 1139 06/11/21 1353 06/25/21 1307 07/20/21 1157 08/28/21 1136  PROBNP 2,074* 1,082* 2,063* 1,581* 1,335*   Recent  Labs    05/23/21 1914  TSH 0.834    BMP Recent Labs    05/22/21 1626 05/22/21 1644 07/20/21 1154 07/31/21 1212 08/28/21 1137  NA 137   < > 141 138 141  K 3.7   < > 4.4 4.7 4.5  CL 106   < > 106 103 106  CO2 24   < > 20 21 21   GLUCOSE 117*   < > 100* 96 107*  BUN 20   < > 23 18 19   CREATININE 1.15*   < > 1.00 1.09* 1.25*  CALCIUM 8.8*   < > 9.3 9.3 9.1  GFRNONAA 50*  --   --   --   --    < > = values in this interval not displayed.    HEMOGLOBIN A1C Lab Results  Component Value Date   HGBA1C 5.6 05/23/2021   MPG 114.02 05/23/2021    IMPRESSION:    ICD-10-CM   1. Persistent atrial fibrillation ([place])  I48.19 EKG 12-Lead    2. Long term (current) use of anticoagulants  Z79.01     3. Chronic HFrEF (heart failure with reduced ejection fraction) ([facility])  I50.22 metoprolol succinate (TOPROL-XL) 50 MG 24 hr tablet    PCV ECHOCARDIOGRAM COMPLETE    dapagliflozin propanediol (FARXIGA) 10 MG TABS tablet    4. Atherosclerosis of native coronary artery of native heart without angina pectoris  I25.10 PCV ECHOCARDIOGRAM COMPLETE    PCV MYOCARDIAL PERFUSION WITH LEXISCAN    5. Hx of CABG  Z95.1 PCV ECHOCARDIOGRAM COMPLETE    PCV MYOCARDIAL PERFUSION WITH LEXISCAN    dapagliflozin propanediol (FARXIGA) 10 MG TABS tablet    6. Cardiomyopathy, unspecified type ([place])  I42.9     7. Benign hypertension  I10     8. Mixed hyperlipidemia  E78.2     9. Lacunar stroke ([place])  I63.81     10. Former smoker  Z87.891        RECOMMENDATIONS: Leah Day is a 75 y.o. female whose past medical history and cardiac risk factors include: Persistent atrial fibrillation (chronicity unknown), established coronary artery disease status post three-vessel CABG (2013), hypertension, hyperlipidemia, history of left-sided breast cancer status postmastectomy, former smoker, postmenopausal female, advanced age, obesity due to excess calories.  Persistent atrial fibrillation: Rate control:  Metoprolol. Rhythm control: N/A. Thromboembolic prophylaxis: Eliquis Status post TEE guided cardioversion 08/05/2021.  NSR postprocedure but transition back to A. fib prior to week follow-up office visit.  At the last visit uptitrated Toprol-XL to 75 mg p.o. daily, but she did not tolerate the higher dose due to soft BP.  We will transition her back to Toprol-XL 50 mg p.o. daily.  Patient is considering antiarrhythmic medications but would like to do it after the holidays.  She is considering either Tikosyn versus amiodarone.  She wants to hold off on being evaluated by cardiac electrophysiology for possible atrial fibrillation ablation.   Long-term oral anticoagulation: Indication: Atrial fibrillation. CHA2DS2-VASc SCORE is 6 which correlates to 9.8% risk of stroke per year. Does not endorse any evidence of bleeding. Fall precautions reviewed. Discussed risks, benefits, alternatives to oral anticoagulation.  Chronic HFrEF, stage B, NYHA class II: Euvolemic.   Medications reconciled. Since last visit has uptitrated Entresto to 97/103 mg p.o. twice daily and has done well. Requesting a refill on Farxiga. Patient has been maximized on GDMT and will repeat echocardiogram in February 2023 to reevaluate LVEF. Monitor for now.  Cardiomyopathy, suspect ischemic versus arrhythmic etiology: See above  Established CAD with prior PCI followed by three-vessel bypass without angina pectoris: Currently anginal free. In the past patient has refused to undergo ischemic evaluation. However, I have strongly encouraged her to reconsider this prior to considering either antiarrhythmic medications versus ablation for the management of her underlying A. fib.  Patient is agreeable with proceeding with stress test to evaluate for reversible ischemia.  Benign essential hypertension: Home blood pressures are improving as her medications are being uptitrated. Low-salt diet recommended.  Hyperlipidemia: Fasting  lipid profile reviewed. Continue statin therapy Does not endorse myalgias.  Former smoker: Educated on the importance of complete smoking cessation.  FINAL MEDICATION LIST END OF ENCOUNTER: Meds ordered this encounter  Medications   metoprolol succinate (TOPROL-XL) 50 MG 24 hr tablet    Sig: Take 1 tablet (50 mg total) by mouth daily. Hold if systolic blood pressure (top number) less than 100 mmHg or pulse less than 60 bpm.    Dispense:  90 tablet  Refill:  0   dapagliflozin propanediol (FARXIGA) 10 MG TABS tablet    Sig: Take 1 tablet (10 mg total) by mouth daily before breakfast.    Dispense:  90 tablet    Refill:  0    Medications Discontinued During This Encounter  Medication Reason   metoprolol succinate (TOPROL-XL) 50 MG 24 hr tablet    FARXIGA 10 MG TABS tablet Reorder    Current Outpatient Medications:    albuterol (VENTOLIN HFA) 108 (90 Base) MCG/ACT inhaler, Inhale 2 puffs into the lungs as needed for wheezing or shortness of breath., Disp: , Rfl:    apixaban (ELIQUIS) 5 MG TABS tablet, Take 1 tablet (5 mg total) by mouth 2 (two) times daily., Disp: 180 tablet, Rfl: 1   aspirin 81 MG EC tablet, Take 81 mg by mouth daily., Disp: , Rfl:    cholecalciferol (VITAMIN D3) 25 MCG (1000 UNIT) tablet, Take 1,000 Units by mouth daily., Disp: , Rfl:    esomeprazole (NEXIUM) 40 MG capsule, Take 40 mg by mouth daily., Disp: , Rfl:    famotidine (PEPCID) 20 MG tablet, Take 20 mg by mouth as needed for heartburn., Disp: , Rfl:    fluticasone (FLONASE) 50 MCG/ACT nasal spray, Place 1 spray into both nostrils daily as needed for allergies or rhinitis., Disp: , Rfl:    levothyroxine (SYNTHROID) 125 MCG tablet, Take 125 mcg by mouth daily before breakfast., Disp: , Rfl:    loratadine (CLARITIN) 10 MG tablet, Take 10 mg by mouth daily., Disp: , Rfl:    montelukast (SINGULAIR) 10 MG tablet, Take 10 mg by mouth at bedtime., Disp: , Rfl:    Multiple Vitamins-Minerals (MULTIVITAMIN WITH  MINERALS) tablet, Take 1 tablet by mouth daily., Disp: , Rfl:    rosuvastatin (CRESTOR) 10 MG tablet, Take 10 mg by mouth daily., Disp: , Rfl:    sacubitril-valsartan (ENTRESTO) 97-103 MG, Take 1 tablet by mouth 2 (two) times daily., Disp: 60 tablet, Rfl: 2   spironolactone (ALDACTONE) 25 MG tablet, Take 1 tablet (25 mg total) by mouth every morning., Disp: 90 tablet, Rfl: 0   SYMBICORT 160-4.5 MCG/ACT inhaler, Inhale 2 puffs into the lungs 2 (two) times daily., Disp: , Rfl:    vitamin B-12 (CYANOCOBALAMIN) 1000 MCG tablet, Take 1,000 mcg by mouth daily., Disp: , Rfl:    vitamin C (ASCORBIC ACID) 500 MG tablet, Take 500 mg by mouth daily., Disp: , Rfl:    dapagliflozin propanediol (FARXIGA) 10 MG TABS tablet, Take 1 tablet (10 mg total) by mouth daily before breakfast., Disp: 90 tablet, Rfl: 0   metoprolol succinate (TOPROL-XL) 50 MG 24 hr tablet, Take 1 tablet (50 mg total) by mouth daily. Hold if systolic blood pressure (top number) less than 100 mmHg or pulse less than 60 bpm., Disp: 90 tablet, Rfl: 0  Orders Placed This Encounter  Procedures   PCV MYOCARDIAL PERFUSION WITH LEXISCAN   EKG 12-Lead   PCV ECHOCARDIOGRAM COMPLETE   There are no Patient Instructions on file for this visit.   --Continue cardiac medications as reconciled in final medication list. --Return in about 9 weeks (around 11/17/2021) for Follow up, A. fib, consider antiarrhythmic medications.. Or sooner if needed. --Continue follow-up with your primary care physician regarding the management of your other chronic comorbid conditions.  Patient's questions and concerns were addressed to her satisfaction. She voices understanding of the instructions provided during this encounter.   This note was created using a voice recognition software as a result there  may be grammatical errors inadvertently enclosed that do not reflect the nature of this encounter. Every attempt is made to correct such errors.  Rex Kras, [place],  [facility]  Pager: [phone] Office: [phone]

## 2021-09-16 ENCOUNTER — Telehealth: Payer: Self-pay | Admitting: Cardiology

## 2021-09-16 NOTE — Telephone Encounter (Signed)
Patient requested a prescription refill at yesterday's appointment and says the wrong one got refilled on accident. Patient needs farxiga 10 mg refill.

## 2021-09-16 NOTE — Telephone Encounter (Signed)
This was sent 09/15/21

## 2021-09-29 DIAGNOSIS — Z79899 Other long term (current) drug therapy: Secondary | ICD-10-CM | POA: Diagnosis not present

## 2021-09-29 DIAGNOSIS — I10 Essential (primary) hypertension: Secondary | ICD-10-CM | POA: Diagnosis not present

## 2021-09-29 DIAGNOSIS — R059 Cough, unspecified: Secondary | ICD-10-CM | POA: Diagnosis not present

## 2021-09-29 DIAGNOSIS — J309 Allergic rhinitis, unspecified: Secondary | ICD-10-CM | POA: Diagnosis not present

## 2021-09-29 DIAGNOSIS — K219 Gastro-esophageal reflux disease without esophagitis: Secondary | ICD-10-CM | POA: Diagnosis not present

## 2021-09-29 DIAGNOSIS — E039 Hypothyroidism, unspecified: Secondary | ICD-10-CM | POA: Diagnosis not present

## 2021-09-29 DIAGNOSIS — E559 Vitamin D deficiency, unspecified: Secondary | ICD-10-CM | POA: Diagnosis not present

## 2021-09-29 DIAGNOSIS — E785 Hyperlipidemia, unspecified: Secondary | ICD-10-CM | POA: Diagnosis not present

## 2021-09-29 DIAGNOSIS — Z8673 Personal history of transient ischemic attack (TIA), and cerebral infarction without residual deficits: Secondary | ICD-10-CM | POA: Diagnosis not present

## 2021-09-30 ENCOUNTER — Ambulatory Visit: Payer: Medicare HMO

## 2021-09-30 ENCOUNTER — Other Ambulatory Visit: Payer: Self-pay

## 2021-09-30 DIAGNOSIS — Z951 Presence of aortocoronary bypass graft: Secondary | ICD-10-CM | POA: Diagnosis not present

## 2021-09-30 DIAGNOSIS — I5022 Chronic systolic (congestive) heart failure: Secondary | ICD-10-CM

## 2021-09-30 DIAGNOSIS — I2510 Atherosclerotic heart disease of native coronary artery without angina pectoris: Secondary | ICD-10-CM | POA: Diagnosis not present

## 2021-10-12 NOTE — Progress Notes (Signed)
Called and spoke with patient regarding her echocardiogram results.

## 2021-10-12 NOTE — Progress Notes (Signed)
Called and spoke with patient regarding her stress test results.

## 2021-10-15 ENCOUNTER — Telehealth: Payer: Self-pay | Admitting: Cardiology

## 2021-10-15 ENCOUNTER — Other Ambulatory Visit: Payer: Self-pay

## 2021-10-15 DIAGNOSIS — I4819 Other persistent atrial fibrillation: Secondary | ICD-10-CM

## 2021-10-15 DIAGNOSIS — I5022 Chronic systolic (congestive) heart failure: Secondary | ICD-10-CM

## 2021-10-15 DIAGNOSIS — Z7901 Long term (current) use of anticoagulants: Secondary | ICD-10-CM

## 2021-10-15 MED ORDER — SPIRONOLACTONE 25 MG PO TABS: 25.0000 mg | ORAL_TABLET | Freq: Every morning | ORAL | 3 refills | Status: DC

## 2021-10-15 MED ORDER — APIXABAN 5 MG PO TABS: 5.0000 mg | ORAL_TABLET | Freq: Two times a day (BID) | ORAL | 3 refills | Status: DC

## 2021-10-15 NOTE — Telephone Encounter (Signed)
Patient requesting refills for eliquis 5 MG and spironolactone 25 MG.

## 2021-10-15 NOTE — Telephone Encounter (Signed)
Ok done

## 2021-10-16 ENCOUNTER — Other Ambulatory Visit: Payer: Self-pay

## 2021-10-16 DIAGNOSIS — Z7901 Long term (current) use of anticoagulants: Secondary | ICD-10-CM

## 2021-10-16 DIAGNOSIS — I4819 Other persistent atrial fibrillation: Secondary | ICD-10-CM

## 2021-10-16 MED ORDER — APIXABAN 5 MG PO TABS: 5.0000 mg | ORAL_TABLET | Freq: Two times a day (BID) | ORAL | 3 refills | Status: DC

## 2021-11-19 ENCOUNTER — Ambulatory Visit: Payer: Medicare HMO | Admitting: Cardiology

## 2021-11-26 ENCOUNTER — Other Ambulatory Visit: Payer: Self-pay | Admitting: Cardiology

## 2021-11-26 DIAGNOSIS — I5022 Chronic systolic (congestive) heart failure: Secondary | ICD-10-CM

## 2021-12-09 ENCOUNTER — Ambulatory Visit: Payer: Medicare HMO | Admitting: Cardiology

## 2021-12-09 ENCOUNTER — Encounter: Payer: Self-pay | Admitting: Cardiology

## 2021-12-09 ENCOUNTER — Other Ambulatory Visit: Payer: Self-pay

## 2021-12-09 VITALS — BP 145/96 | HR 77 | Temp 97.5°F | Resp 16 | Ht 62.0 in | Wt 192.0 lb

## 2021-12-09 DIAGNOSIS — I429 Cardiomyopathy, unspecified: Secondary | ICD-10-CM | POA: Diagnosis not present

## 2021-12-09 DIAGNOSIS — I10 Essential (primary) hypertension: Secondary | ICD-10-CM | POA: Diagnosis not present

## 2021-12-09 DIAGNOSIS — Z7901 Long term (current) use of anticoagulants: Secondary | ICD-10-CM

## 2021-12-09 DIAGNOSIS — E782 Mixed hyperlipidemia: Secondary | ICD-10-CM | POA: Diagnosis not present

## 2021-12-09 DIAGNOSIS — I4819 Other persistent atrial fibrillation: Secondary | ICD-10-CM

## 2021-12-09 DIAGNOSIS — I2510 Atherosclerotic heart disease of native coronary artery without angina pectoris: Secondary | ICD-10-CM | POA: Diagnosis not present

## 2021-12-09 DIAGNOSIS — Z8673 Personal history of transient ischemic attack (TIA), and cerebral infarction without residual deficits: Secondary | ICD-10-CM

## 2021-12-09 DIAGNOSIS — Z951 Presence of aortocoronary bypass graft: Secondary | ICD-10-CM | POA: Diagnosis not present

## 2021-12-09 MED ORDER — DAPAGLIFLOZIN PROPANEDIOL 10 MG PO TABS: 10.0000 mg | ORAL_TABLET | Freq: Every day | ORAL | 1 refills | Status: DC

## 2021-12-09 NOTE — Progress Notes (Signed)
Date:  12/09/2021   ID:  Leah Day, DOB March 03, 1946, MRN [id number]  PCP:  Nicholos Johns, MD  Cardiologist:  Rex Kras, DO, [facility] (established care 05/23/2021)  Date: 12/09/21 Last Office Visit: 09/15/2021  Chief Complaint  Patient presents with   Atrial Fibrillation   Follow-up   Congestive Heart Failure   Results    HPI  Leah Day is a 76 y.o. female who presents to the office with a chief complaint of " follow-up for A. fib, CAD, and review test results." follow-up for congestive heart failure and atrial fibrillation management. " Patient's past medical history and cardiovascular risk factors include: Persistent atrial fibrillation status post TEE guided cardioversion, established coronary artery disease status post three-vessel CABG (2013), recovered cardiomyopathy, hypertension, hyperlipidemia, history of left-sided breast cancer status postmastectomy, former smoker, postmenopausal female, advanced age, obesity due to excess calories.  In July 2022 presented to the hospital with right-sided focal neurological deficits and received tPA with resolution of symptoms the following day.  Stroke work-up noted atrial fibrillation on surface ECG and echocardiogram noted moderately reduced LVEF.  Cardiology was consulted for further evaluation and management.  After being discharged patient was started on guideline directed medical therapy and underwent TEE guided cardioversion which restored normal sinus rhythm but by the time she presented for 2-week follow-up visit she was back in atrial fibrillation.  During last office visit we discussed management of A. fib with either initiation of antiarrhythmic medications or considering catheter directed therapy.  Given her comorbidities recommended either Tikosyn or amiodarone as antiarrhythmic drug of choice.  Given the holidays that she wanted to hold off on additional changes to her medical therapy.  She now presents for follow-up.  In  addition, given the up titration of GDMT and no recent ischemic work-up she was recommended to undergo echo and stress test given her history of cardiomyopathy, history of CABG.  Echo notes improvement in LVEF from 30-35% to 50-55% and stress test was reported to be low risk study.  Details discussed with her during today's encounter and noted below for further reference.  Clinically she denies angina pectoris or heart failure symptoms.  She is requesting a referral to cardiac electrophysiology to discuss whether she would be better suited with antiarrhythmic medications versus catheter directed ablation.  ALLERGIES: Allergies  Allergen Reactions   Cephalexin Rash        Atenolol Swelling   Irbesartan Cough   Lisinopril Cough    MEDICATION LIST PRIOR TO VISIT: Current Meds  Medication Sig   albuterol (VENTOLIN HFA) 108 (90 Base) MCG/ACT inhaler Inhale 2 puffs into the lungs as needed for wheezing or shortness of breath.   apixaban (ELIQUIS) 5 MG TABS tablet Take 1 tablet (5 mg total) by mouth 2 (two) times daily.   aspirin 81 MG EC tablet Take 81 mg by mouth daily.   cholecalciferol (VITAMIN D3) 25 MCG (1000 UNIT) tablet Take 1,000 Units by mouth daily.   ENTRESTO 97-103 MG TAKE 1 TABLET BY MOUTH TWICE A DAY   esomeprazole (NEXIUM) 40 MG capsule Take 40 mg by mouth daily.   famotidine (PEPCID) 20 MG tablet Take 20 mg by mouth as needed for heartburn.   fluticasone (FLONASE) 50 MCG/ACT nasal spray Place 1 spray into both nostrils daily as needed for allergies or rhinitis.   levothyroxine (SYNTHROID) 125 MCG tablet Take 125 mcg by mouth daily before breakfast.   loratadine (CLARITIN) 10 MG tablet Take 10 mg by mouth daily.   metoprolol  succinate (TOPROL-XL) 50 MG 24 hr tablet Take 1 tablet (50 mg total) by mouth daily. Hold if systolic blood pressure (top number) less than 100 mmHg or pulse less than 60 bpm.   montelukast (SINGULAIR) 10 MG tablet Take 10 mg by mouth at bedtime.    Multiple Vitamins-Minerals (MULTIVITAMIN WITH MINERALS) tablet Take 1 tablet by mouth daily.   rosuvastatin (CRESTOR) 10 MG tablet Take 10 mg by mouth daily.   spironolactone (ALDACTONE) 25 MG tablet Take 1 tablet (25 mg total) by mouth every morning.   SYMBICORT 160-4.5 MCG/ACT inhaler Inhale 2 puffs into the lungs 2 (two) times daily.   vitamin B-12 (CYANOCOBALAMIN) 1000 MCG tablet Take 1,000 mcg by mouth daily.   vitamin C (ASCORBIC ACID) 500 MG tablet Take 500 mg by mouth daily.   [DISCONTINUED] dapagliflozin propanediol (FARXIGA) 10 MG TABS tablet Take 1 tablet (10 mg total) by mouth daily before breakfast.     PAST MEDICAL HISTORY: Coronary artery disease status post CABG 2013 at [facility]. Hypertension. Hyperlipidemia. Breast cancer, left-sided status postmastectomy Cardiomyopathy. Atrial fibrillation.  PAST SURGICAL HISTORY: PCI to the coronary artery Three-vessel CABG, 2013 Bilateral carpal tunnel surgery. Tubal ligation. Appendectomy. Transesophageal guided cardioversion 07/28/2021  FAMILY HISTORY: No family history of premature CAD.  Father passed away at the age of 31 due to myocardial infarction.  And mother passed away at the age of 26 due to Alzheimer's disease complications.  SOCIAL HISTORY:  The patient  reports that she has quit smoking. Her smoking use included cigarettes. She has a 45.00 pack-year smoking history. She has never used smokeless tobacco. She reports that she does not use drugs.  REVIEW OF SYSTEMS: Review of Systems  Constitutional: Negative for chills and fever.  HENT:  Negative for hoarse voice and nosebleeds.   Eyes:  Negative for discharge, double vision and pain.  Cardiovascular:  Negative for chest pain, claudication, dyspnea on exertion, leg swelling, near-syncope, orthopnea, palpitations, paroxysmal nocturnal dyspnea and syncope.  Respiratory:  Negative for hemoptysis and shortness of breath.   Musculoskeletal:  Negative for muscle  cramps and myalgias.  Gastrointestinal:  Negative for abdominal pain, constipation, diarrhea, hematemesis, hematochezia, melena, nausea and vomiting.  Neurological:  Negative for dizziness and light-headedness.   PHYSICAL EXAM: Vitals with BMI 12/09/2021 12/09/2021 09/15/2021  Height - 5\' 2"  5\' 2"   Weight - 192 lbs 193 lbs  BMI - 77.41 28.78  Systolic 676 720 947  Diastolic 96 88 71  Pulse 77 68 73   CONSTITUTIONAL: Age-appropriate female, well-developed and well-nourished. No acute distress. SKIN: Skin is warm and dry. No rash noted. No cyanosis. No pallor. No jaundice HEAD: Normocephalic and atraumatic. EYES: No scleral icterus MOUTH/THROAT: Moist oral membranes. NECK: No JVD present. No thyromegaly noted. No carotid bruits LYMPHATIC: No visible cervical adenopathy. CHEST Normal respiratory effort. No intercostal retractions LUNGS: Clear to auscultation bilaterally.  No stridor. No wheezes. No rales. CARDIOVASCULAR: Irregularly irregular, positive S9-G2, soft holosystolic murmur heard at the apex, no gallops or rubs ABDOMINAL: Obese, soft, nontender, nondistended, positive bowel sounds in all 4 quadrants, no apparent ascites. EXTREMITIES: No peripheral edema, 2+ DP and PT pulses. HEMATOLOGIC: No significant bruising NEUROLOGIC: Oriented to person, place, and time. Nonfocal. Normal muscle tone. PSYCHIATRIC: Normal mood and affect. Normal behavior. Cooperative  CARDIAC DATABASE: EKG: 05/22/2021: Atrial fibrillation, controlled ventricular rate, 77 bpm, rare PVCs, without underlying ischemia or injury pattern.  08/05/2021: Normal sinus rhythm, 71 bpm, PACs, left axis deviation, without injury pattern.  09/15/2021: Atrial fibrillation,  65 bpm, PRWP, nonspecific T wave abnormality, without underlying injury pattern.   Echocardiogram: 05/23/2021: LVEF 30-35%, global hypokinesis, average GLS -11.4%, moderately reduced right ventricular function, normal right ventricular size, mild MR, aortic  valve sclerosis without stenosis, no LV thrombus.  09/30/2021: Low normal LV systolic function with visual EF 50-55%. Left ventricle cavity is normal in size. Normal left ventricular wall thickness. Normal global wall motion. Abnormal septal wall motion due to post-operative septum. Unable to evaluate diastolic function due to atrial fibrillation.  Left atrial cavity is mildly dilated. No significant valvular heart disease. Compared to study 05/23/2021: LVEF improved from 30-35% to 50-55% otherwise no significant change.   Stress Testing: Lexiscan Tetrofosmin stress test 09/30/2021: 1 Day Rest/Stress Protocol. Stress EKG is non-diagnostic, as this is pharmacological stress test using Lexiscan. Normal myocardial perfusion without evidence of reversible myocardial ischemia or prior infarct. Left ventricular size and wall thickness preserved. Calculated LVEF 45% , may be reduced secondary to underlying A. fib. Consider transthoracic echo to reevaluate LVEF. No previous exam available for comparison. Low risk study.  Heart Catheterization: None  LABORATORY DATA: CBC Latest Ref Rng & Units 07/31/2021 05/29/2021 05/22/2021  WBC 3.4 - 10.8 x10E3/uL 8.5 - -  Hemoglobin 11.1 - 15.9 g/dL 13.5 13.3 12.6  Hematocrit 34.0 - 46.6 % 38.9 38.6 37.0  Platelets 150 - 450 x10E3/uL 210 - -    CMP Latest Ref Rng & Units 08/28/2021 07/31/2021 07/20/2021  Glucose 70 - 99 mg/dL 107(H) 96 100(H)  BUN 8 - 27 mg/dL 19 18 23   Creatinine 0.57 - 1.00 mg/dL 1.25(H) 1.09(H) 1.00  Sodium 134 - 144 mmol/L 141 138 141  Potassium 3.5 - 5.2 mmol/L 4.5 4.7 4.4  Chloride 96 - 106 mmol/L 106 103 106  CO2 20 - 29 mmol/L 21 21 20   Calcium 8.7 - 10.3 mg/dL 9.1 9.3 9.3  Total Protein 6.5 - 8.1 g/dL - - -  Total Bilirubin 0.3 - 1.2 mg/dL - - -  Alkaline Phos 38 - 126 U/L - - -  AST 15 - 41 U/L - - -  ALT 0 - 44 U/L - - -    Lipid Panel     Component Value Date/Time   CHOL 143 05/23/2021 1914   TRIG 106 05/23/2021  1914   HDL 49 05/23/2021 1914   CHOLHDL 2.9 05/23/2021 1914   VLDL 21 05/23/2021 1914   LDLCALC 73 05/23/2021 1914    No components found for: NTPROBNP Recent Labs    05/29/21 1139 06/11/21 1353 06/25/21 1307 07/20/21 1157 08/28/21 1136  PROBNP 2,074* 1,082* 2,063* 1,581* 1,335*   Recent Labs    05/23/21 1914  TSH 0.834    BMP Recent Labs    05/22/21 1626 05/22/21 1644 07/20/21 1154 07/31/21 1212 08/28/21 1137  NA 137   < > 141 138 141  K 3.7   < > 4.4 4.7 4.5  CL 106   < > 106 103 106  CO2 24   < > 20 21 21   GLUCOSE 117*   < > 100* 96 107*  BUN 20   < > 23 18 19   CREATININE 1.15*   < > 1.00 1.09* 1.25*  CALCIUM 8.8*   < > 9.3 9.3 9.1  GFRNONAA 50*  --   --   --   --    < > = values in this interval not displayed.    HEMOGLOBIN A1C Lab Results  Component Value Date   HGBA1C 5.6 05/23/2021  MPG 114.02 05/23/2021    IMPRESSION:    ICD-10-CM   1. Persistent atrial fibrillation [facility])  I48.19 Ambulatory referral to Cardiology    Ambulatory referral to Sleep Studies    2. Long term (current) use of anticoagulants  Z79.01 Ambulatory referral to Cardiology    3. Atherosclerosis of native coronary artery of native heart without angina pectoris  I25.10 dapagliflozin propanediol (FARXIGA) 10 MG TABS tablet    4. Hx of CABG  Z95.1 Ambulatory referral to Sleep Studies    dapagliflozin propanediol (FARXIGA) 10 MG TABS tablet    5. Cardiomyopathy, recovered  I42.9 dapagliflozin propanediol (FARXIGA) 10 MG TABS tablet    6. Benign hypertension  I10     7. Mixed hyperlipidemia  E78.2     8. Hx of stroke  Z86.73 Ambulatory referral to Sleep Studies       RECOMMENDATIONS: Kynzlie Hilleary is a 76 y.o. female whose past medical history and cardiac risk factors include: Persistent atrial fibrillation status post TEE guided cardioversion, established coronary artery disease status post three-vessel CABG (2013), recovered cardiomyopathy, hypertension, hyperlipidemia,  history of left-sided breast cancer status postmastectomy, former smoker, postmenopausal female, advanced age, obesity due to excess calories.  Persistent atrial fibrillation (HCC) Rate control: Metoprolol. Rhythm control: N/A. Thromboembolic prophylaxis: Eliquis S/p TEE guided cardioversion 08/05/2021-restored to NSR but at the time of follow-up visit was back in A. fib. Despite up titration of AV nodal blocking agents she remains in A. fib. Patient is motivated on restoring normal sinus rhythm but would like to discuss it further with cardiac electrophysiology to see if that she is a candidate for catheter directed ablation versus antiarrhythmic medications.  Will refer to Dr. Lars Mage for further evaluation and management. Will refer to Dr. Brett Fairy for sleep apnea evaluation-given her comorbid conditions and difficulty sleeping more than 2 or 3 hours per night.  Long term (current) use of anticoagulants Indication: Persistent atrial fibrillation. CHA2DS2-VASc SCORE is 6 which correlates to 9.8% risk of stroke per year. Does not endorse evidence of bleeding. No recent falls. Reemphasized the risks, benefits, and alternatives to oral anticoagulation.  Atherosclerosis of native coronary artery of native heart without angina pectoris /history of CABG /recovered cardiomyopathy Free of angina pectoris. No recent use of sublingual nitroglycerin tablets. Echo 09/2021: LVEF has improved from 30-35% to 50-55% additional details reviewed with her during today's encounter and noted above for further reference MPI 09/2021: Low risk study, results reviewed with the patient during today's office visit. Medications reconciled Farxiga refilled   FINAL MEDICATION LIST END OF ENCOUNTER: Meds ordered this encounter  Medications   dapagliflozin propanediol (FARXIGA) 10 MG TABS tablet    Sig: Take 1 tablet (10 mg total) by mouth daily before breakfast.    Dispense:  90 tablet    Refill:  1     Medications Discontinued During This Encounter  Medication Reason   dapagliflozin propanediol (FARXIGA) 10 MG TABS tablet Reorder    Current Outpatient Medications:    albuterol (VENTOLIN HFA) 108 (90 Base) MCG/ACT inhaler, Inhale 2 puffs into the lungs as needed for wheezing or shortness of breath., Disp: , Rfl:    apixaban (ELIQUIS) 5 MG TABS tablet, Take 1 tablet (5 mg total) by mouth 2 (two) times daily., Disp: 180 tablet, Rfl: 3   aspirin 81 MG EC tablet, Take 81 mg by mouth daily., Disp: , Rfl:    cholecalciferol (VITAMIN D3) 25 MCG (1000 UNIT) tablet, Take 1,000 Units by mouth daily., Disp: , Rfl:  ENTRESTO 97-103 MG, TAKE 1 TABLET BY MOUTH TWICE A DAY, Disp: 60 tablet, Rfl: 2   esomeprazole (NEXIUM) 40 MG capsule, Take 40 mg by mouth daily., Disp: , Rfl:    famotidine (PEPCID) 20 MG tablet, Take 20 mg by mouth as needed for heartburn., Disp: , Rfl:    fluticasone (FLONASE) 50 MCG/ACT nasal spray, Place 1 spray into both nostrils daily as needed for allergies or rhinitis., Disp: , Rfl:    levothyroxine (SYNTHROID) 125 MCG tablet, Take 125 mcg by mouth daily before breakfast., Disp: , Rfl:    loratadine (CLARITIN) 10 MG tablet, Take 10 mg by mouth daily., Disp: , Rfl:    metoprolol succinate (TOPROL-XL) 50 MG 24 hr tablet, Take 1 tablet (50 mg total) by mouth daily. Hold if systolic blood pressure (top number) less than 100 mmHg or pulse less than 60 bpm., Disp: 90 tablet, Rfl: 0   montelukast (SINGULAIR) 10 MG tablet, Take 10 mg by mouth at bedtime., Disp: , Rfl:    Multiple Vitamins-Minerals (MULTIVITAMIN WITH MINERALS) tablet, Take 1 tablet by mouth daily., Disp: , Rfl:    rosuvastatin (CRESTOR) 10 MG tablet, Take 10 mg by mouth daily., Disp: , Rfl:    spironolactone (ALDACTONE) 25 MG tablet, Take 1 tablet (25 mg total) by mouth every morning., Disp: 90 tablet, Rfl: 3   SYMBICORT 160-4.5 MCG/ACT inhaler, Inhale 2 puffs into the lungs 2 (two) times daily., Disp: , Rfl:    vitamin  B-12 (CYANOCOBALAMIN) 1000 MCG tablet, Take 1,000 mcg by mouth daily., Disp: , Rfl:    vitamin C (ASCORBIC ACID) 500 MG tablet, Take 500 mg by mouth daily., Disp: , Rfl:    dapagliflozin propanediol (FARXIGA) 10 MG TABS tablet, Take 1 tablet (10 mg total) by mouth daily before breakfast., Disp: 90 tablet, Rfl: 1  Orders Placed This Encounter  Procedures   Ambulatory referral to Cardiology   Ambulatory referral to Sleep Studies   There are no Patient Instructions on file for this visit.   --Continue cardiac medications as reconciled in final medication list. --Return in about 3 months (around 03/08/2022) for Follow up, A. fib. Or sooner if needed. --Continue follow-up with your primary care physician regarding the management of your other chronic comorbid conditions.  Patient's questions and concerns were addressed to her satisfaction. She voices understanding of the instructions provided during this encounter.   This note was created using a voice recognition software as a result there may be grammatical errors inadvertently enclosed that do not reflect the nature of this encounter. Every attempt is made to correct such errors.  Rex Kras, [place], [facility]  Pager: [phone] Office: [phone]

## 2022-01-11 ENCOUNTER — Other Ambulatory Visit: Payer: Self-pay

## 2022-01-11 DIAGNOSIS — I5022 Chronic systolic (congestive) heart failure: Secondary | ICD-10-CM

## 2022-01-11 MED ORDER — SPIRONOLACTONE 25 MG PO TABS: 25.0000 mg | ORAL_TABLET | Freq: Every morning | ORAL | 3 refills | Status: DC

## 2022-01-11 MED ORDER — METOPROLOL SUCCINATE ER 50 MG PO TB24: 50.0000 mg | ORAL_TABLET | Freq: Every day | ORAL | 0 refills | Status: DC

## 2022-01-19 ENCOUNTER — Other Ambulatory Visit: Payer: Self-pay

## 2022-01-19 ENCOUNTER — Encounter: Payer: Self-pay | Admitting: Cardiology

## 2022-01-19 ENCOUNTER — Ambulatory Visit: Payer: Medicare HMO | Admitting: Cardiology

## 2022-01-19 VITALS — BP 126/80 | HR 82 | Ht 62.0 in | Wt 190.6 lb

## 2022-01-19 DIAGNOSIS — I4819 Other persistent atrial fibrillation: Secondary | ICD-10-CM

## 2022-01-19 DIAGNOSIS — I5022 Chronic systolic (congestive) heart failure: Secondary | ICD-10-CM

## 2022-01-19 DIAGNOSIS — Z951 Presence of aortocoronary bypass graft: Secondary | ICD-10-CM

## 2022-01-19 NOTE — Patient Instructions (Signed)
Medication Instructions:  ?Your physician recommends that you continue on your current medications as directed. Please refer to the Current Medication list given to you today. ? ?*If you need a refill on your cardiac medications before your next appointment, please call your pharmacy* ? ? ?Lab Work: ?None ordered. Today ? ?If you have labs (blood work) drawn today and your tests are completely normal, you will receive your results only by: ?MyChart Message (if you have MyChart) OR ?A paper copy in the mail ?If you have any lab test that is abnormal or we need to change your treatment, we will call you to review the results. ? ? ?Testing/Procedures: ?Your physician has recommended that you have an ablation. Catheter ablation is a medical procedure used to treat some cardiac arrhythmias (irregular heartbeats). During catheter ablation, a long, thin, flexible tube is put into a blood vessel in your groin (upper thigh), or neck. This tube is called an ablation catheter. It is then guided to your heart through the blood vessel. Radio frequency waves destroy small areas of heart tissue where abnormal heartbeats may cause an arrhythmia to start. Please see the instruction sheet given to you today.  ? ? ?Follow-Up: ?At [facility], you and your health needs are our priority.  As part of our continuing mission to provide you with exceptional heart care, we have created designated Provider Care Teams.  These Care Teams include your primary Cardiologist (physician) and Advanced Practice Providers (APPs -  Physician Assistants and Nurse Practitioners) who all work together to provide you with the care you need, when you need it. ? ?We recommend signing up for the patient portal called "MyChart".  Sign up information is provided on this After Visit Summary.  MyChart is used to connect with patients for Virtual Visits (Telemedicine).  Patients are able to view lab/test results, encounter notes, upcoming appointments, etc.   Non-urgent messages can be sent to your provider as well.   ?To learn more about what you can do with MyChart, go to [email or web address].   ? ?Your next appointment:   ?To be scheduled ? ?Current Afib Ablation dates: ? ?Monday, Mar 15 2022 ? ?Monday, Mar 22 2022 ? ?Monday, Mar 29 2022 ? ?Friday, Apr 02 2022 ? ?Please contact us through a MyChart message or call [phone] once you decide if you would like to proceed with the ablation.  Dates are not guaranteed and fill quickly. ?

## 2022-01-19 NOTE — Progress Notes (Signed)
?Electrophysiology Office Note:   ? ?Date:  01/19/2022  ? ?ID:  Leah Day, DOB 05/14/1946, MRN [id number] ? ?PCP:  Nicholos Johns, MD  ?[facility] Cardiologist:  None  ?[facility] Electrophysiologist:  Vickie Epley, MD  ? ?Referring MD: Rex Kras, DO  ? ?Chief Complaint: Consult for AAD vs. ablation ? ?History of Present Illness:   ? ?Leah Day is a 76 y.o. female who presents for an evaluation of Afib AAD vs. ablation at the request of Dr. Terri Skains. Their medical history includes atrial fibrillation, cardiomyopathy, CAD s/p CABG x3, hypertension, hyperlipidemia, and breast cancer s/p left mastectomy. ? ?She was seen by Dr. Terri Skains 12/09/2021. She remained in Afib despite up titration of AV nodal blocking agents. She requested a referral to EP to discuss if AAD or catheter ablation was a better option for her. ? ?Overall, she is feeling better and less fatigued on her current regimen. ? ?Typically she is not able to tell if she is in atrial fibrillation. Unless she is in a certain body position while lying down, she does not feel palpitations. ? ?She denies any chest pain, shortness of breath, or peripheral edema. No lightheadedness, headaches, syncope, orthopnea, or PND. ? ? ?  ?Past Medical History:  ?Diagnosis Date  ? Atrial fibrillation ([place])   ? Breast cancer ([place])   ? Cardiomyopathy ([place])   ? Coronary artery disease   ? Hyperlipidemia   ? Hypertension   ? ? ?Past Surgical History:  ?Procedure Laterality Date  ? BUBBLE STUDY  08/05/2021  ? Procedure: BUBBLE STUDY;  Surgeon: Rex Kras, DO;  Location: [place];  Service: Cardiovascular;;  ? CARDIOVERSION N/A 08/05/2021  ? Procedure: CARDIOVERSION;  Surgeon: Rex Kras, DO;  Location: [facility];  Service: Cardiovascular;  Laterality: N/A;  ? CORONARY ARTERY BYPASS GRAFT    ? TEE WITHOUT CARDIOVERSION N/A 08/05/2021  ? Procedure: TRANSESOPHAGEAL ECHOCARDIOGRAM (TEE);  Surgeon: Rex Kras, DO;  Location: [facility];  Service:  Cardiovascular;  Laterality: N/A;  ? ? ?Current Medications: ?Current Meds  ?Medication Sig  ? albuterol (VENTOLIN HFA) 108 (90 Base) MCG/ACT inhaler Inhale 2 puffs into the lungs as needed for wheezing or shortness of breath.  ? apixaban (ELIQUIS) 5 MG TABS tablet Take 1 tablet (5 mg total) by mouth 2 (two) times daily.  ? aspirin 81 MG EC tablet Take 81 mg by mouth daily.  ? cholecalciferol (VITAMIN D3) 25 MCG (1000 UNIT) tablet Take 1,000 Units by mouth daily.  ? dapagliflozin propanediol (FARXIGA) 10 MG TABS tablet Take 1 tablet (10 mg total) by mouth daily before breakfast.  ? ENTRESTO 97-103 MG TAKE 1 TABLET BY MOUTH TWICE A DAY  ? esomeprazole (NEXIUM) 40 MG capsule Take 40 mg by mouth daily.  ? famotidine (PEPCID) 20 MG tablet Take 20 mg by mouth as needed for heartburn.  ? fluticasone (FLONASE) 50 MCG/ACT nasal spray Place 1 spray into both nostrils daily as needed for allergies or rhinitis.  ? levothyroxine (SYNTHROID) 125 MCG tablet Take 125 mcg by mouth daily before breakfast.  ? loratadine (CLARITIN) 10 MG tablet Take 10 mg by mouth daily.  ? metoprolol succinate (TOPROL-XL) 50 MG 24 hr tablet Take 1 tablet (50 mg total) by mouth daily. Hold if systolic blood pressure (top number) less than 100 mmHg or pulse less than 60 bpm.  ? Multiple Vitamins-Minerals (MULTIVITAMIN WITH MINERALS) tablet Take 1 tablet by mouth daily.  ? rosuvastatin (CRESTOR) 10 MG tablet Take 10 mg by mouth daily.  ?  spironolactone (ALDACTONE) 25 MG tablet Take 1 tablet (25 mg total) by mouth every morning.  ? SYMBICORT 160-4.5 MCG/ACT inhaler Inhale 2 puffs into the lungs 2 (two) times daily.  ? vitamin B-12 (CYANOCOBALAMIN) 1000 MCG tablet Take 1,000 mcg by mouth daily.  ? vitamin C (ASCORBIC ACID) 500 MG tablet Take 500 mg by mouth daily.  ?  ? ?Allergies:   Cephalexin, Atenolol, Irbesartan, and Lisinopril  ? ?Social History  ? ?Socioeconomic History  ? Marital status: Divorced  ?  Spouse name: Not on file  ? Number of children:  Not on file  ? Years of education: Not on file  ? Highest education level: Not on file  ?Occupational History  ? Not on file  ?Tobacco Use  ? Smoking status: Former  ?  Packs/day: 1.50  ?  Years: 30.00  ?  Pack years: 45.00  ?  Types: Cigarettes  ?  Quit date: 2013  ?  Years since quitting: 10.2  ? Smokeless tobacco: Never  ?Vaping Use  ? Vaping Use: Never used  ?Substance and Sexual Activity  ? Alcohol use: Never  ? Drug use: Never  ? Sexual activity: Not Currently  ?Other Topics Concern  ? Not on file  ?Social History Narrative  ? Not on file  ? ?Social Determinants of Health  ? ?Financial Resource Strain: Not on file  ?Food Insecurity: Not on file  ?Transportation Needs: Not on file  ?Physical Activity: Not on file  ?Stress: Not on file  ?Social Connections: Not on file  ?  ? ?Family History: ?The patient's family history includes Heart attack in her father. ? ?ROS:   ?Please see the history of present illness.    ?All other systems reviewed and are negative. ? ?EKGs/Labs/Other Studies Reviewed:   ? ?The following studies were reviewed today: ? ?PCV Echo 09/30/2021: ?Echocardiogram 09/30/2021:  ?Low normal LV systolic function with visual EF 50-55%. Left ventricle  ?cavity is normal in size. Normal left ventricular wall thickness. Normal  ?global wall motion. Abnormal septal wall motion due to post-operative  ?septum. Unable to evaluate diastolic function due to atrial fibrillation.  ?Left atrial cavity is mildly dilated.  ?No significant valvular heart disease.  ?Compared to study 05/23/2021: LVEF improved from 30-35% to 50-55%  ?otherwise no significant change.  ? ?PCV Lexiscan Myoview: ?Lexiscan Tetrofosmin stress test 09/30/2021: ?1 Day Rest/Stress Protocol. ?Stress EKG is non-diagnostic, as this is pharmacological stress test using Lexiscan. ?Normal myocardial perfusion without evidence of reversible myocardial ischemia or prior infarct. ?Left ventricular size and wall thickness preserved. ?Calculated LVEF  45% , may be reduced secondary to underlying A. fib.  Consider transthoracic echo to reevaluate LVEF. ?No previous exam available for comparison. ?Low risk study. ? ? ?EKG:   EKG is personally reviewed.  ?01/19/2022: EKG was not ordered. ? ? ?Recent Labs: ?05/22/2021: ALT 22 ?05/23/2021: B Natriuretic Peptide 408.2; TSH 0.834 ?07/31/2021: Hemoglobin 13.5; Platelets 210 ?08/28/2021: BUN 19; Creatinine, Ser 1.25; Magnesium 2.0; NT-Pro BNP 1,335; Potassium 4.5; Sodium 141  ? ?Recent Lipid Panel ?   ?Component Value Date/Time  ? CHOL 143 05/23/2021 1914  ? TRIG 106 05/23/2021 1914  ? HDL 49 05/23/2021 1914  ? CHOLHDL 2.9 05/23/2021 1914  ? VLDL 21 05/23/2021 1914  ? [place] 73 05/23/2021 1914  ? ? ?Physical Exam:   ? ?VS:  BP 126/80   Pulse 82   Ht '5\' 2"'$  (1.575 m)   Wt 190 lb 9.6 oz (86.5 kg)   SpO2  97%   BMI 34.86 kg/m?    ? ?Wt Readings from Last 3 Encounters:  ?01/19/22 190 lb 9.6 oz (86.5 kg)  ?12/09/21 192 lb (87.1 kg)  ?09/15/21 193 lb (87.5 kg)  ?  ? ?GEN: Well nourished, well developed in no acute distress ?HEENT: Normal ?NECK: No JVD; No carotid bruits ?LYMPHATICS: No lymphadenopathy ?CARDIAC: RRR, 1/6 systolic murmur, No rubs, no gallops ?RESPIRATORY:  Clear to auscultation without rales, wheezing or rhonchi  ?ABDOMEN: Soft, non-tender, non-distended ?MUSCULOSKELETAL:  No edema; No deformity  ?SKIN: Warm and dry ?NEUROLOGIC:  Alert and oriented x 3 ?PSYCHIATRIC:  Normal affect  ? ? ?  ? ?ASSESSMENT:   ? ?1. Persistent atrial fibrillation ([place])   ?2. Hx of CABG   ?3. Chronic HFrEF (heart failure with reduced ejection fraction) ([place])   ? ?PLAN:   ? ?In order of problems listed above: ? ?#Persistent atrial fibrillation ?Not a large symptom burden when in atrial fibrillation but with a history of chronic systolic heart failure, rhythm control is indicated.  I discussed treatment options for her atrial fibrillation at today's visit including antiarrhythmic drug therapy and catheter ablation.  Given her history of  coronary artery disease, antiarrhythmic drug options are limited.  I discussed Tikosyn and amiodarone.  I also discussed catheter ablation during the visit today.  I discussed the procedural details, risks, recovery

## 2022-01-21 DIAGNOSIS — E785 Hyperlipidemia, unspecified: Secondary | ICD-10-CM | POA: Diagnosis not present

## 2022-01-21 DIAGNOSIS — E559 Vitamin D deficiency, unspecified: Secondary | ICD-10-CM | POA: Diagnosis not present

## 2022-01-21 DIAGNOSIS — R059 Cough, unspecified: Secondary | ICD-10-CM | POA: Diagnosis not present

## 2022-01-21 DIAGNOSIS — J309 Allergic rhinitis, unspecified: Secondary | ICD-10-CM | POA: Diagnosis not present

## 2022-01-21 DIAGNOSIS — K219 Gastro-esophageal reflux disease without esophagitis: Secondary | ICD-10-CM | POA: Diagnosis not present

## 2022-01-21 DIAGNOSIS — Z79899 Other long term (current) drug therapy: Secondary | ICD-10-CM | POA: Diagnosis not present

## 2022-01-21 DIAGNOSIS — I10 Essential (primary) hypertension: Secondary | ICD-10-CM | POA: Diagnosis not present

## 2022-01-21 DIAGNOSIS — E039 Hypothyroidism, unspecified: Secondary | ICD-10-CM | POA: Diagnosis not present

## 2022-01-21 DIAGNOSIS — Z8673 Personal history of transient ischemic attack (TIA), and cerebral infarction without residual deficits: Secondary | ICD-10-CM | POA: Diagnosis not present

## 2022-01-22 ENCOUNTER — Telehealth: Payer: Self-pay | Admitting: Cardiology

## 2022-01-22 DIAGNOSIS — I4891 Unspecified atrial fibrillation: Secondary | ICD-10-CM

## 2022-01-22 DIAGNOSIS — Z01818 Encounter for other preprocedural examination: Secondary | ICD-10-CM

## 2022-01-22 NOTE — Telephone Encounter (Signed)
Called patient to let her know Dr. Mardene Speak nurse will be in touch with her next week. ?

## 2022-01-22 NOTE — Telephone Encounter (Signed)
?  Pt is ready to schedule her ablation ?

## 2022-01-25 ENCOUNTER — Encounter: Payer: Self-pay | Admitting: *Deleted

## 2022-01-25 NOTE — Telephone Encounter (Signed)
Patient picked ablation date and the pre op lab date. Will call after work up is complete and go over instructions. Patient verbalized understanding and agreement.  ?

## 2022-01-25 NOTE — Telephone Encounter (Signed)
Went over instructions sent over [employer] and mailed letters as patient requested. Verbalized understanding and agreement.  ?

## 2022-01-28 ENCOUNTER — Telehealth: Payer: Self-pay | Admitting: Cardiology

## 2022-01-28 ENCOUNTER — Other Ambulatory Visit: Payer: Self-pay

## 2022-01-28 DIAGNOSIS — I5022 Chronic systolic (congestive) heart failure: Secondary | ICD-10-CM

## 2022-01-28 MED ORDER — ENTRESTO 97-103 MG PO TABS: 1.0000 | ORAL_TABLET | Freq: Two times a day (BID) | ORAL | 1 refills | Status: DC

## 2022-01-28 NOTE — Telephone Encounter (Signed)
Patient says she needs a prescription for entresto 90 days instead of 30 days. She also has a few questions regarding her metoprolol, if someone can please call her back to discuss. Phone number listed is OK to call. ?

## 2022-01-29 NOTE — Telephone Encounter (Signed)
Patient recently was experiencing softer blood pressures in the morning.  Therefore started taking her metoprolol at night which has resolved this issue. ? ?Patient just wanted to call and discussed this change. ? ?She scheduled for atrial fibrillation ablation in May 2023 with Dr. Quentin Ore. ? ?Rex Kras, DO, FACC ? ?Pager: [phone] ?Office: [phone]  ?

## 2022-02-12 ENCOUNTER — Institutional Professional Consult (permissible substitution): Payer: Medicare HMO | Admitting: Cardiology

## 2022-02-16 ENCOUNTER — Institutional Professional Consult (permissible substitution): Payer: Medicare HMO | Admitting: Neurology

## 2022-03-08 ENCOUNTER — Ambulatory Visit: Payer: Medicare HMO | Admitting: Cardiology

## 2022-03-12 ENCOUNTER — Other Ambulatory Visit: Payer: Medicare HMO | Admitting: *Deleted

## 2022-03-12 DIAGNOSIS — Z01818 Encounter for other preprocedural examination: Secondary | ICD-10-CM | POA: Diagnosis not present

## 2022-03-12 DIAGNOSIS — I4891 Unspecified atrial fibrillation: Secondary | ICD-10-CM

## 2022-03-13 LAB — BASIC METABOLIC PANEL
BUN/Creatinine Ratio: 16 (ref 12–28)
BUN: 19 mg/dL (ref 8–27)
CO2: 22 mmol/L (ref 20–29)
Calcium: 9.4 mg/dL (ref 8.7–10.3)
Chloride: 102 mmol/L (ref 96–106)
Creatinine, Ser: 1.18 mg/dL — ABNORMAL HIGH (ref 0.57–1.00)
Glucose: 101 mg/dL — ABNORMAL HIGH (ref 70–99)
Potassium: 4.5 mmol/L (ref 3.5–5.2)
Sodium: 138 mmol/L (ref 134–144)
eGFR: 48 mL/min/{1.73_m2} — ABNORMAL LOW (ref >59)

## 2022-03-13 LAB — CBC WITH DIFFERENTIAL/PLATELET
Basophils Absolute: 0.1 10*3/uL (ref 0.0–0.2)
Basos: 1 %
EOS (ABSOLUTE): 0.1 10*3/uL (ref 0.0–0.4)
Eos: 1 %
Hematocrit: 43.1 % (ref 34.0–46.6)
Hemoglobin: 14.7 g/dL (ref 11.1–15.9)
Immature Grans (Abs): 0 10*3/uL (ref 0.0–0.1)
Immature Granulocytes: 0 %
Lymphocytes Absolute: 2.4 10*3/uL (ref 0.7–3.1)
Lymphs: 30 %
MCH: 31.1 pg (ref 26.6–33.0)
MCHC: 34.1 g/dL (ref 31.5–35.7)
MCV: 91 fL (ref 79–97)
Monocytes Absolute: 0.6 10*3/uL (ref 0.1–0.9)
Monocytes: 8 %
Neutrophils Absolute: 4.8 10*3/uL (ref 1.4–7.0)
Neutrophils: 60 %
Platelets: 231 10*3/uL (ref 150–450)
RBC: 4.73 x10E6/uL (ref 3.77–5.28)
RDW: 12.9 % (ref 11.7–15.4)
WBC: 8 10*3/uL (ref 3.4–10.8)

## 2022-03-24 ENCOUNTER — Telehealth (HOSPITAL_COMMUNITY): Payer: Self-pay | Admitting: *Deleted

## 2022-03-24 NOTE — Telephone Encounter (Signed)
Reaching out to patient to offer assistance regarding upcoming cardiac imaging study; pt verbalizes understanding of appt date/time, parking situation and where to check in, pre-test NPO status  and verified current allergies; name and call back number provided for further questions should they arise  Danie Hannig RN Navigator Cardiac Imaging [facility] [phone] office [phone] cell  Patient aware to arrive at 1pm. 

## 2022-03-25 ENCOUNTER — Ambulatory Visit (HOSPITAL_COMMUNITY)
Admission: RE | Admit: 2022-03-25 | Discharge: 2022-03-25 | Disposition: A | Discharge status: Home / Self Care | Payer: Medicare HMO | Source: Ambulatory Visit | Attending: Cardiology | Admitting: Cardiology

## 2022-03-25 DIAGNOSIS — I4891 Unspecified atrial fibrillation: Secondary | ICD-10-CM | POA: Insufficient documentation

## 2022-03-25 MED ORDER — METOPROLOL TARTRATE 5 MG/5ML IV SOLN
INTRAVENOUS | Status: AC
  Administered 2022-03-25: 5 mg via INTRAVENOUS
  Filled 2022-03-25: qty 20

## 2022-03-25 MED ORDER — IOHEXOL 350 MG/ML SOLN
95.0000 mL | Freq: Once | INTRAVENOUS | Status: AC | PRN
  Administered 2022-03-25: 95 mL via INTRAVENOUS

## 2022-03-25 MED ORDER — METOPROLOL TARTRATE 5 MG/5ML IV SOLN
5.0000 mg | INTRAVENOUS | Status: DC | PRN
  Administered 2022-03-25: 5 mg via INTRAVENOUS

## 2022-04-01 NOTE — Pre-Procedure Instructions (Signed)
Instructed patient on the following items: Arrival time 1030 Nothing to eat or drink after midnight No meds AM of procedure Responsible person to drive you home and stay with you for 24 hrs  Have you missed any doses of anti-coagulant Eliquis- hasn't missed any doses

## 2022-04-02 ENCOUNTER — Ambulatory Visit (HOSPITAL_BASED_OUTPATIENT_CLINIC_OR_DEPARTMENT_OTHER): Payer: Medicare HMO | Admitting: Anesthesiology

## 2022-04-02 ENCOUNTER — Other Ambulatory Visit: Payer: Self-pay

## 2022-04-02 ENCOUNTER — Encounter (HOSPITAL_COMMUNITY)
Admission: RE | Disposition: A | Discharge status: Home / Self Care | Payer: Medicare HMO | Source: Home / Self Care | Attending: Cardiology

## 2022-04-02 ENCOUNTER — Ambulatory Visit (HOSPITAL_COMMUNITY): Payer: Medicare HMO | Admitting: Anesthesiology

## 2022-04-02 ENCOUNTER — Ambulatory Visit (HOSPITAL_COMMUNITY)
Admission: RE | Admit: 2022-04-02 | Discharge: 2022-04-02 | Disposition: A | Discharge status: Home / Self Care | Payer: Medicare HMO | Attending: Cardiology | Admitting: Cardiology

## 2022-04-02 ENCOUNTER — Encounter (HOSPITAL_COMMUNITY): Payer: Self-pay | Admitting: Cardiology

## 2022-04-02 DIAGNOSIS — I5022 Chronic systolic (congestive) heart failure: Secondary | ICD-10-CM | POA: Insufficient documentation

## 2022-04-02 DIAGNOSIS — Z79899 Other long term (current) drug therapy: Secondary | ICD-10-CM | POA: Diagnosis not present

## 2022-04-02 DIAGNOSIS — I4891 Unspecified atrial fibrillation: Secondary | ICD-10-CM

## 2022-04-02 DIAGNOSIS — Z853 Personal history of malignant neoplasm of breast: Secondary | ICD-10-CM | POA: Insufficient documentation

## 2022-04-02 DIAGNOSIS — I10 Essential (primary) hypertension: Secondary | ICD-10-CM | POA: Diagnosis not present

## 2022-04-02 DIAGNOSIS — I4819 Other persistent atrial fibrillation: Secondary | ICD-10-CM | POA: Insufficient documentation

## 2022-04-02 DIAGNOSIS — I429 Cardiomyopathy, unspecified: Secondary | ICD-10-CM | POA: Diagnosis not present

## 2022-04-02 DIAGNOSIS — I110 Hypertensive heart disease with heart failure: Secondary | ICD-10-CM | POA: Insufficient documentation

## 2022-04-02 DIAGNOSIS — I2510 Atherosclerotic heart disease of native coronary artery without angina pectoris: Secondary | ICD-10-CM | POA: Insufficient documentation

## 2022-04-02 DIAGNOSIS — Z87891 Personal history of nicotine dependence: Secondary | ICD-10-CM | POA: Insufficient documentation

## 2022-04-02 DIAGNOSIS — Z951 Presence of aortocoronary bypass graft: Secondary | ICD-10-CM | POA: Insufficient documentation

## 2022-04-02 DIAGNOSIS — E785 Hyperlipidemia, unspecified: Secondary | ICD-10-CM | POA: Insufficient documentation

## 2022-04-02 DIAGNOSIS — Z7901 Long term (current) use of anticoagulants: Secondary | ICD-10-CM | POA: Insufficient documentation

## 2022-04-02 DIAGNOSIS — Z9012 Acquired absence of left breast and nipple: Secondary | ICD-10-CM | POA: Insufficient documentation

## 2022-04-02 HISTORY — PX: ATRIAL FIBRILLATION ABLATION: EP1191

## 2022-04-02 LAB — POCT ACTIVATED CLOTTING TIME
Activated Clotting Time: 311 seconds
Activated Clotting Time: 342 seconds

## 2022-04-02 SURGERY — ATRIAL FIBRILLATION ABLATION
Anesthesia: General

## 2022-04-02 MED ORDER — ACETAMINOPHEN 325 MG PO TABS
650.0000 mg | ORAL_TABLET | ORAL | Status: DC | PRN
  Administered 2022-04-02: 650 mg via ORAL

## 2022-04-02 MED ORDER — SODIUM CHLORIDE 0.9% FLUSH
3.0000 mL | INTRAVENOUS | Status: DC | PRN
Start: 2022-04-02 — End: 2022-04-03

## 2022-04-02 MED ORDER — ACETAMINOPHEN 325 MG PO TABS
ORAL_TABLET | ORAL | Status: AC
  Filled 2022-04-02: qty 2

## 2022-04-02 MED ORDER — PROPOFOL 10 MG/ML IV BOLUS
INTRAVENOUS | Status: DC | PRN
  Administered 2022-04-02: 130 mg via INTRAVENOUS

## 2022-04-02 MED ORDER — SODIUM CHLORIDE 0.9 % IV SOLN
INTRAVENOUS | Status: DC
Start: 2022-04-02 — End: 2022-04-03

## 2022-04-02 MED ORDER — ROCURONIUM BROMIDE 10 MG/ML (PF) SYRINGE
PREFILLED_SYRINGE | INTRAVENOUS | Status: DC | PRN
  Administered 2022-04-02: 20 mg via INTRAVENOUS
  Administered 2022-04-02: 60 mg via INTRAVENOUS

## 2022-04-02 MED ORDER — HEPARIN (PORCINE) IN NACL 1000-0.9 UT/500ML-% IV SOLN
INTRAVENOUS | Status: DC | PRN
  Administered 2022-04-02 (×3): 500 mL

## 2022-04-02 MED ORDER — PHENYLEPHRINE 80 MCG/ML (10ML) SYRINGE FOR IV PUSH (FOR BLOOD PRESSURE SUPPORT)
PREFILLED_SYRINGE | INTRAVENOUS | Status: DC | PRN
  Administered 2022-04-02 (×2): 160 ug via INTRAVENOUS

## 2022-04-02 MED ORDER — ONDANSETRON HCL 4 MG/2ML IJ SOLN: 4.0000 mg | Freq: Four times a day (QID) | INTRAMUSCULAR | Status: DC | PRN

## 2022-04-02 MED ORDER — LIDOCAINE 2% (20 MG/ML) 5 ML SYRINGE
INTRAMUSCULAR | Status: DC | PRN
  Administered 2022-04-02: 50 mg via INTRAVENOUS

## 2022-04-02 MED ORDER — HEPARIN (PORCINE) IN NACL 2000-0.9 UNIT/L-% IV SOLN
INTRAVENOUS | Status: DC | PRN
  Administered 2022-04-02: 1000 mL

## 2022-04-02 MED ORDER — PROTAMINE SULFATE 10 MG/ML IV SOLN
INTRAVENOUS | Status: DC | PRN
  Administered 2022-04-02: 35 mg via INTRAVENOUS

## 2022-04-02 MED ORDER — HEPARIN SODIUM (PORCINE) 1000 UNIT/ML IJ SOLN
INTRAMUSCULAR | Status: AC
  Filled 2022-04-02: qty 10

## 2022-04-02 MED ORDER — PANTOPRAZOLE SODIUM 40 MG PO TBEC
40.0000 mg | DELAYED_RELEASE_TABLET | Freq: Every day | ORAL | Status: DC
  Administered 2022-04-02: 40 mg via ORAL
  Filled 2022-04-02: qty 1

## 2022-04-02 MED ORDER — FENTANYL CITRATE (PF) 100 MCG/2ML IJ SOLN
INTRAMUSCULAR | Status: DC | PRN
  Administered 2022-04-02 (×2): 50 ug via INTRAVENOUS

## 2022-04-02 MED ORDER — SODIUM CHLORIDE 0.9% FLUSH: 3.0000 mL | Freq: Two times a day (BID) | INTRAVENOUS | Status: DC

## 2022-04-02 MED ORDER — ONDANSETRON HCL 4 MG/2ML IJ SOLN
INTRAMUSCULAR | Status: DC | PRN
  Administered 2022-04-02: 4 mg via INTRAVENOUS

## 2022-04-02 MED ORDER — COLCHICINE 0.6 MG PO TABS
0.6000 mg | ORAL_TABLET | Freq: Two times a day (BID) | ORAL | Status: DC
  Administered 2022-04-02: 0.6 mg via ORAL
  Filled 2022-04-02: qty 1

## 2022-04-02 MED ORDER — COLCHICINE 0.6 MG PO TABS: 0.6000 mg | ORAL_TABLET | Freq: Two times a day (BID) | ORAL | 0 refills | Status: DC

## 2022-04-02 MED ORDER — HEPARIN (PORCINE) IN NACL 1000-0.9 UT/500ML-% IV SOLN
INTRAVENOUS | Status: AC
  Filled 2022-04-02: qty 500

## 2022-04-02 MED ORDER — PHENYLEPHRINE HCL-NACL 20-0.9 MG/250ML-% IV SOLN
INTRAVENOUS | Status: DC | PRN
  Administered 2022-04-02: 20 ug/min via INTRAVENOUS

## 2022-04-02 MED ORDER — DEXAMETHASONE SODIUM PHOSPHATE 10 MG/ML IJ SOLN
INTRAMUSCULAR | Status: DC | PRN
  Administered 2022-04-02: 4 mg via INTRAVENOUS

## 2022-04-02 MED ORDER — HEPARIN SODIUM (PORCINE) 1000 UNIT/ML IJ SOLN
INTRAMUSCULAR | Status: DC | PRN
  Administered 2022-04-02: 13000 [IU] via INTRAVENOUS
  Administered 2022-04-02: 3000 [IU] via INTRAVENOUS

## 2022-04-02 MED ORDER — SUGAMMADEX SODIUM 200 MG/2ML IV SOLN
INTRAVENOUS | Status: DC | PRN
Start: 2022-04-02 — End: 2022-04-02
  Administered 2022-04-02: 200 mg via INTRAVENOUS

## 2022-04-02 MED ORDER — APIXABAN 5 MG PO TABS
5.0000 mg | ORAL_TABLET | Freq: Two times a day (BID) | ORAL | Status: DC
  Administered 2022-04-02: 5 mg via ORAL
  Filled 2022-04-02: qty 1

## 2022-04-02 MED ORDER — SODIUM CHLORIDE 0.9 % IV SOLN: 250.0000 mL | INTRAVENOUS | Status: DC | PRN

## 2022-04-02 SURGICAL SUPPLY — 19 items
BLANKET WARM UNDERBOD FULL ACC (MISCELLANEOUS) ×2 IMPLANT
CATH 8FR REPROCESSED SOUNDSTAR (CATHETERS) ×2 IMPLANT
CATH 8FR SOUNDSTAR REPROCESSED (CATHETERS) IMPLANT
CATH OCTARAY 2.0 F 3-3-3-3-3 (CATHETERS) ×1 IMPLANT
CATH S CIRCA THERM PROBE 10F (CATHETERS) ×1 IMPLANT
CATH SMTCH THERMOCOOL SF DF (CATHETERS) ×1 IMPLANT
CATH WEBSTER BI DIR CS D-F CRV (CATHETERS) ×1 IMPLANT
CLOSURE PERCLOSE PROSTYLE (VASCULAR PRODUCTS) ×3 IMPLANT
COVER SWIFTLINK CONNECTOR (BAG) ×2 IMPLANT
PACK EP LATEX FREE (CUSTOM PROCEDURE TRAY) ×2
PACK EP LF (CUSTOM PROCEDURE TRAY) ×1 IMPLANT
PAD DEFIB RADIO PHYSIO CONN (PAD) ×2 IMPLANT
PATCH CARTO3 (PAD) ×1 IMPLANT
SHEATH BAYLIS TRANSSEPTAL 98CM (NEEDLE) ×1 IMPLANT
SHEATH CARTO VIZIGO SM CVD (SHEATH) ×1 IMPLANT
SHEATH PINNACLE 8F 10CM (SHEATH) ×1 IMPLANT
SHEATH PINNACLE 9F 10CM (SHEATH) ×1 IMPLANT
SHEATH PROBE COVER 6X72 (BAG) ×1 IMPLANT
TUBING SMART ABLATE COOLFLOW (TUBING) ×1 IMPLANT

## 2022-04-02 NOTE — Transfer of Care (Signed)
Immediate Anesthesia Transfer of Care Note  Patient: Leah Day  Procedure(s) Performed: ATRIAL FIBRILLATION ABLATION  Patient Location: Cath Lab  Anesthesia Type:General  Level of Consciousness: drowsy and patient cooperative  Airway & Oxygen Therapy: Patient Spontanous Breathing and Patient connected to nasal cannula oxygen  Post-op Assessment: Report given to RN  Post vital signs: Reviewed and stable  Last Vitals:  Vitals Value Taken Time  BP 106/48 04/02/22 1510  Temp    Pulse 68 04/02/22 1511  Resp 24 04/02/22 1511  SpO2 98 % 04/02/22 1511  Vitals shown include unvalidated device data.  Last Pain:  Vitals:   04/02/22 1119  TempSrc:   PainSc: 0-No pain         Complications: No notable events documented.

## 2022-04-02 NOTE — Anesthesia Preprocedure Evaluation (Signed)
Anesthesia Evaluation  Patient identified by MRN, date of birth, ID band Patient awake    Reviewed: Allergy & Precautions, H&P , NPO status , Patient's Chart, lab work & pertinent test results  Airway Mallampati: II   Neck ROM: full    Dental   Pulmonary former smoker,    breath sounds clear to auscultation       Cardiovascular hypertension, + CAD and + CABG  + dysrhythmias Atrial Fibrillation  Rhythm:regular Rate:Normal     Neuro/Psych    GI/Hepatic   Endo/Other    Renal/GU      Musculoskeletal   Abdominal   Peds  Hematology   Anesthesia Other Findings   Reproductive/Obstetrics H/o breast CA                             Anesthesia Physical Anesthesia Plan  ASA: 3  Anesthesia Plan: General   Post-op Pain Management:    Induction: Intravenous  PONV Risk Score and Plan: 3 and Ondansetron, Dexamethasone and Treatment may vary due to age or medical condition  Airway Management Planned: Oral ETT  Additional Equipment:   Intra-op Plan:   Post-operative Plan: Extubation in OR  Informed Consent: I have reviewed the patients History and Physical, chart, labs and discussed the procedure including the risks, benefits and alternatives for the proposed anesthesia with the patient or authorized representative who has indicated his/her understanding and acceptance.     Dental advisory given  Plan Discussed with: CRNA, Anesthesiologist and Surgeon  Anesthesia Plan Comments:         Anesthesia Quick Evaluation

## 2022-04-02 NOTE — Discharge Instructions (Addendum)
Cardiac Ablation, Care After  This sheet gives you information about how to care for yourself after your procedure. Your health care provider may also give you more specific instructions. If you have problems or questions, contact your health care provider. What can I expect after the procedure? After the procedure, it is common to have: Bruising around your puncture site. Tenderness around your puncture site. Skipped heartbeats. Tiredness (fatigue).  Follow these instructions at home: Puncture site care  Follow instructions from your health care provider about how to take care of your puncture site. Make sure you: If present, leave stitches (sutures), skin glue, or adhesive strips in place. These skin closures may need to stay in place for up to 2 weeks. If adhesive strip edges start to loosen and curl up, you may trim the loose edges. Do not remove adhesive strips completely unless your health care provider tells you to do that. If a large square bandage is present, this may be removed 24 hours after surgery.  Check your puncture site every day for signs of infection. Check for: Redness, swelling, or pain. Fluid or blood. If your puncture site starts to bleed, lie down on your back, apply firm pressure to the area, and contact your health care provider. Warmth. Pus or a bad smell. A pea or small marble sized lump at the site is normal and can take up to three months to resolve.  Driving Do not drive for at least 4 days after your procedure or however long your health care provider recommends. (Do not resume driving if you have previously been instructed not to drive for other health reasons.) Do not drive or use heavy machinery while taking prescription pain medicine. Activity Avoid activities that take a lot of effort for at least 7 days after your procedure. Do not lift anything that is heavier than 5 lb (4.5 kg) for one week.  No sexual activity for 1 week.  Return to your normal  activities as told by your health care provider. Ask your health care provider what activities are safe for you. General instructions Take over-the-counter and prescription medicines only as told by your health care provider. Do not use any products that contain nicotine or tobacco, such as cigarettes and e-cigarettes. If you need help quitting, ask your health care provider. You may shower after 24 hours, but Do not take baths, swim, or use a hot tub for 1 week.  Do not drink alcohol for 24 hours after your procedure. Keep all follow-up visits as told by your health care provider. This is important. Contact a health care provider if: You have redness, mild swelling, or pain around your puncture site. You have fluid or blood coming from your puncture site that stops after applying firm pressure to the area. Your puncture site feels warm to the touch. You have pus or a bad smell coming from your puncture site. You have a fever. You have chest pain or discomfort that spreads to your neck, jaw, or arm. You are sweating a lot. You feel nauseous. You have a fast or irregular heartbeat. You have shortness of breath. You are dizzy or light-headed and feel the need to lie down. You have pain or numbness in the arm or leg closest to your puncture site. Get help right away if: Your puncture site suddenly swells. Your puncture site is bleeding and the bleeding does not stop after applying firm pressure to the area. These symptoms may represent a serious problem that is   an emergency. Do not wait to see if the symptoms will go away. Get medical help right away. Call your local emergency services (911 in the [place] Do not drive yourself to the hospital. Summary After the procedure, it is normal to have bruising and tenderness at the puncture site in your groin, neck, or forearm. Check your puncture site every day for signs of infection. Get help right away if your puncture site is bleeding and the  bleeding does not stop after applying firm pressure to the area. This is a medical emergency. This information is not intended to replace advice given to you by your health care provider. Make sure you discuss any questions you have with your health care provider.  Post procedure care instructions No driving for 4 days. No lifting over 5 lbs for 1 week. No vigorous or sexual activity for 1 week. You may return to work/your usual activities on 04/09/22. Keep procedure site clean & dry. If you notice increased pain, swelling, bleeding or pus, call/return!  You may shower after 24 hours, but no soaking in baths/hot tubs/pools for 1 week.    You have an appointment set up with the [facility].  Multiple studies have shown that being followed by a dedicated atrial fibrillation clinic in addition to the standard care you receive from your other physicians improves health. We believe that enrollment in the atrial fibrillation clinic will allow [place] to better care for you.   The phone number to the [facility] is [phone]. The clinic is staffed Monday through Friday from 8:30am to 5pm.  Parking Directions: The clinic is located in the Heart and Vascular Building connected to [facility]. 1)From [address] turn on to [address] and go to the 3rd entrance  (Heart and Vascular entrance) on the right. 2)Look to the right for Heart &Vascular Parking Garage. 3)A code for the entrance is required, for June is 1004.   4)Take the elevators to the 1st floor. Registration is in the room with the glass walls at the end of the hallway.  If you have any trouble parking or locating the clinic, please don't hesitate to call [phone].

## 2022-04-02 NOTE — H&P (Signed)
Electrophysiology Office Note:     Date:  04/02/2022    ID:  Leah Day, DOB 17-Dec-1945, MRN [id number]   PCP:  Nicholos Johns, MD        [facility] Cardiologist:  None  [facility] Electrophysiologist:  Vickie Epley, MD    Referring MD: Rex Kras, DO    Chief Complaint: Consult for AAD vs. ablation   History of Present Illness:     Leah Day is a 76 y.o. female who presents for an evaluation of Afib AAD vs. ablation at the request of Dr. Terri Skains. Their medical history includes atrial fibrillation, cardiomyopathy, CAD s/p CABG x3, hypertension, hyperlipidemia, and breast cancer s/p left mastectomy.   She was seen by Dr. Terri Skains 12/09/2021. She remained in Afib despite up titration of AV nodal blocking agents. She requested a referral to EP to discuss if AAD or catheter ablation was a better option for her.   Overall, she is feeling better and less fatigued on her current regimen.   Typically she is not able to tell if she is in atrial fibrillation. Unless she is in a certain body position while lying down, she does not feel palpitations.   She denies any chest pain, shortness of breath, or peripheral edema. No lightheadedness, headaches, syncope, orthopnea, or PND.       Objective        Past Medical History:  Diagnosis Date   Atrial fibrillation ([place])     Breast cancer ([place])     Cardiomyopathy ([place])     Coronary artery disease     Hyperlipidemia     Hypertension             Past Surgical History:  Procedure Laterality Date   BUBBLE STUDY   08/05/2021    Procedure: BUBBLE STUDY;  Surgeon: Rex Kras, DO;  Location: [place];  Service: Cardiovascular;;   CARDIOVERSION N/A 08/05/2021    Procedure: CARDIOVERSION;  Surgeon: Rex Kras, DO;  Location: [place];  Service: Cardiovascular;  Laterality: N/A;   CORONARY ARTERY BYPASS GRAFT       TEE WITHOUT CARDIOVERSION N/A 08/05/2021    Procedure: TRANSESOPHAGEAL ECHOCARDIOGRAM (TEE);  Surgeon:  Rex Kras, DO;  Location: [place] [facility];  Service: Cardiovascular;  Laterality: N/A;      Current Medications: Active Medications      Current Meds  Medication Sig   albuterol (VENTOLIN HFA) 108 (90 Base) MCG/ACT inhaler Inhale 2 puffs into the lungs as needed for wheezing or shortness of breath.   apixaban (ELIQUIS) 5 MG TABS tablet Take 1 tablet (5 mg total) by mouth 2 (two) times daily.   aspirin 81 MG EC tablet Take 81 mg by mouth daily.   cholecalciferol (VITAMIN D3) 25 MCG (1000 UNIT) tablet Take 1,000 Units by mouth daily.   dapagliflozin propanediol (FARXIGA) 10 MG TABS tablet Take 1 tablet (10 mg total) by mouth daily before breakfast.   ENTRESTO 97-103 MG TAKE 1 TABLET BY MOUTH TWICE A DAY   esomeprazole (NEXIUM) 40 MG capsule Take 40 mg by mouth daily.   famotidine (PEPCID) 20 MG tablet Take 20 mg by mouth as needed for heartburn.   fluticasone (FLONASE) 50 MCG/ACT nasal spray Place 1 spray into both nostrils daily as needed for allergies or rhinitis.   levothyroxine (SYNTHROID) 125 MCG tablet Take 125 mcg by mouth daily before breakfast.   loratadine (CLARITIN) 10 MG tablet Take 10 mg by mouth daily.   metoprolol succinate (TOPROL-XL) 50 MG 24 hr  tablet Take 1 tablet (50 mg total) by mouth daily. Hold if systolic blood pressure (top number) less than 100 mmHg or pulse less than 60 bpm.   Multiple Vitamins-Minerals (MULTIVITAMIN WITH MINERALS) tablet Take 1 tablet by mouth daily.   rosuvastatin (CRESTOR) 10 MG tablet Take 10 mg by mouth daily.   spironolactone (ALDACTONE) 25 MG tablet Take 1 tablet (25 mg total) by mouth every morning.   SYMBICORT 160-4.5 MCG/ACT inhaler Inhale 2 puffs into the lungs 2 (two) times daily.   vitamin B-12 (CYANOCOBALAMIN) 1000 MCG tablet Take 1,000 mcg by mouth daily.   vitamin C (ASCORBIC ACID) 500 MG tablet Take 500 mg by mouth daily.        Allergies:   Cephalexin, Atenolol, Irbesartan, and Lisinopril    Social History          Socioeconomic History   Marital status: Divorced      Spouse name: Not on file   Number of children: Not on file   Years of education: Not on file   Highest education level: Not on file  Occupational History   Not on file  Tobacco Use   Smoking status: Former      Packs/day: 1.50      Years: 30.00      Pack years: 45.00      Types: Cigarettes      Quit date: 2013      Years since quitting: 10.2   Smokeless tobacco: Never  Vaping Use   Vaping Use: Never used  Substance and Sexual Activity   Alcohol use: Never   Drug use: Never   Sexual activity: Not Currently  Other Topics Concern   Not on file  Social History Narrative   Not on file    Social Determinants of Health    Financial Resource Strain: Not on file  Food Insecurity: Not on file  Transportation Needs: Not on file  Physical Activity: Not on file  Stress: Not on file  Social Connections: Not on file      Family History: The patient's family history includes Heart attack in her father.   ROS:   Please see the history of present illness.    All other systems reviewed and are negative.   EKGs/Labs/Other Studies Reviewed:     The following studies were reviewed today:   PCV Echo 09/30/2021: Echocardiogram 09/30/2021:  Low normal LV systolic function with visual EF 50-55%. Left ventricle  cavity is normal in size. Normal left ventricular wall thickness. Normal  global wall motion. Abnormal septal wall motion due to post-operative  septum. Unable to evaluate diastolic function due to atrial fibrillation.  Left atrial cavity is mildly dilated.  No significant valvular heart disease.  Compared to study 05/23/2021: LVEF improved from 30-35% to 50-55%  otherwise no significant change.    PCV Lexiscan Myoview: Lexiscan Tetrofosmin stress test 09/30/2021: 1 Day Rest/Stress Protocol. Stress EKG is non-diagnostic, as this is pharmacological stress test using Lexiscan. Normal myocardial perfusion without  evidence of reversible myocardial ischemia or prior infarct. Left ventricular size and wall thickness preserved. Calculated LVEF 45% , may be reduced secondary to underlying A. fib.  Consider transthoracic echo to reevaluate LVEF. No previous exam available for comparison. Low risk study.     EKG:   EKG is personally reviewed.  01/19/2022: EKG was not ordered.     Recent Labs: 05/22/2021: ALT 22 05/23/2021: B Natriuretic Peptide 408.2; TSH 0.834 07/31/2021: Hemoglobin 13.5; Platelets 210 08/28/2021: BUN 19; Creatinine, Ser  1.25; Magnesium 2.0; NT-Pro BNP 1,335; Potassium 4.5; Sodium 141    Recent Lipid Panel Labs (Brief)          Component Value Date/Time    CHOL 143 05/23/2021 1914    TRIG 106 05/23/2021 1914    HDL 49 05/23/2021 1914    CHOLHDL 2.9 05/23/2021 1914    VLDL 21 05/23/2021 1914    LDLCALC 73 05/23/2021 1914        Physical Exam:     VS:  BP 114/81   Pulse 73   Ht '5\' 2"'$  (1.575 m)   Wt 190 lb 9.6 oz (86.5 kg)   SpO2 97%   BMI 34.86 kg/m         Wt Readings from Last 3 Encounters:  01/19/22 190 lb 9.6 oz (86.5 kg)  12/09/21 192 lb (87.1 kg)  09/15/21 193 lb (87.5 kg)      GEN: Well nourished, well developed in no acute distress HEENT: Normal NECK: No JVD; No carotid bruits LYMPHATICS: No lymphadenopathy CARDIAC: RRR, 1/6 systolic murmur, No rubs, no gallops RESPIRATORY:  Clear to auscultation without rales, wheezing or rhonchi  ABDOMEN: Soft, non-tender, non-distended MUSCULOSKELETAL:  No edema; No deformity  SKIN: Warm and dry NEUROLOGIC:  Alert and oriented x 3 PSYCHIATRIC:  Normal affect          Assessment     ASSESSMENT:     1. Persistent atrial fibrillation ([place])   2. Hx of CABG   3. Chronic HFrEF (heart failure with reduced ejection fraction) (HCC)     PLAN:     In order of problems listed above:   #Persistent atrial fibrillation Not a large symptom burden when in atrial fibrillation but with a history of chronic systolic  heart failure, rhythm control is indicated.  I discussed treatment options for her atrial fibrillation at today's visit including antiarrhythmic drug therapy and catheter ablation.  Given her history of coronary artery disease, antiarrhythmic drug options are limited.  I discussed Tikosyn and amiodarone.  I also discussed catheter ablation during the visit today.  I discussed the procedural details, risks, recovery and likelihood of success.  She would like some time to think about the options before making a final decision which I think is very reasonable.  In the meantime, she will continue her Eliquis for stroke prophylaxis.   Risk, benefits, and alternatives to EP study and radiofrequency ablation for afib were also discussed in detail today. These risks include but are not limited to stroke, bleeding, vascular damage, tamponade, perforation, damage to the esophagus, lungs, and other structures, pulmonary vein stenosis, worsening renal function, and death. Carto, ICE, anesthesia are requested for the procedure.  Will also obtain CT PV protocol prior to the procedure to exclude LAA thrombus and further evaluate atrial anatomy.   Ablation strategy would be PVI plus posterior wall.   #Coronary artery disease Post CABG.  No ischemic symptoms today.  Continue aspirin and rosuvastatin.  #Chronic systolic heart failure NYHA class II today.  Warm and dry on exam.  Continue medical therapy including Farxiga, Toprol, Entresto and Aldactone.     Presents for PVI today.      Signed, Hilton Cork. Quentin Ore, MD, [facility], [facility] 04/02/2022 Electrophysiology [facility]

## 2022-04-02 NOTE — Anesthesia Procedure Notes (Signed)
Procedure Name: Intubation Date/Time: 04/02/2022 1:01 PM Performed by: Barrington Ellison, CRNA Pre-anesthesia Checklist: Patient identified, Emergency Drugs available, Suction available and Patient being monitored Patient Re-evaluated:Patient Re-evaluated prior to induction Oxygen Delivery Method: Circle System Utilized Preoxygenation: Pre-oxygenation with 100% oxygen Induction Type: IV induction Ventilation: Mask ventilation without difficulty Laryngoscope Size: Mac and 3 Grade View: Grade I Tube type: Oral Tube size: 7.0 mm Number of attempts: 1 Airway Equipment and Method: Stylet and Oral airway Placement Confirmation: ETT inserted through vocal cords under direct vision, positive ETCO2 and breath sounds checked- equal and bilateral Secured at: 21 cm Tube secured with: Tape Dental Injury: Teeth and Oropharynx as per pre-operative assessment

## 2022-04-06 ENCOUNTER — Encounter (HOSPITAL_COMMUNITY): Payer: Self-pay | Admitting: Cardiology

## 2022-04-06 NOTE — Anesthesia Postprocedure Evaluation (Signed)
Anesthesia Post Note  Patient: Leah Day  Procedure(s) Performed: ATRIAL FIBRILLATION ABLATION     Patient location during evaluation: Cath Lab Anesthesia Type: General Level of consciousness: awake and alert Pain management: pain level controlled Vital Signs Assessment: post-procedure vital signs reviewed and stable Respiratory status: spontaneous breathing, nonlabored ventilation and respiratory function stable Cardiovascular status: blood pressure returned to baseline and stable Postop Assessment: no apparent nausea or vomiting Anesthetic complications: no   No notable events documented.  Last Vitals:  Vitals:   04/02/22 1935 04/02/22 1945  BP: 138/66 116/66  Pulse: 76 98  Resp: 19 19  Temp:    SpO2: 96% 95%    Last Pain:  Vitals:   04/06/22 1040  TempSrc:   PainSc: 0-No pain                 Tamsyn Owusu

## 2022-04-30 ENCOUNTER — Ambulatory Visit (HOSPITAL_COMMUNITY)
Admission: RE | Admit: 2022-04-30 | Discharge: 2022-04-30 | Disposition: A | Discharge status: Home / Self Care | Payer: Medicare HMO | Source: Ambulatory Visit | Attending: Physician Assistant | Admitting: Physician Assistant

## 2022-04-30 ENCOUNTER — Encounter (HOSPITAL_COMMUNITY): Payer: Self-pay | Admitting: Nurse Practitioner

## 2022-04-30 VITALS — BP 124/84 | HR 95 | Ht 62.0 in | Wt 191.2 lb

## 2022-04-30 DIAGNOSIS — I509 Heart failure, unspecified: Secondary | ICD-10-CM | POA: Diagnosis not present

## 2022-04-30 DIAGNOSIS — I110 Hypertensive heart disease with heart failure: Secondary | ICD-10-CM | POA: Insufficient documentation

## 2022-04-30 DIAGNOSIS — Z7901 Long term (current) use of anticoagulants: Secondary | ICD-10-CM | POA: Diagnosis not present

## 2022-04-30 DIAGNOSIS — D6869 Other thrombophilia: Secondary | ICD-10-CM

## 2022-04-30 DIAGNOSIS — I959 Hypotension, unspecified: Secondary | ICD-10-CM | POA: Diagnosis not present

## 2022-04-30 DIAGNOSIS — I5022 Chronic systolic (congestive) heart failure: Secondary | ICD-10-CM | POA: Diagnosis not present

## 2022-04-30 DIAGNOSIS — Z8673 Personal history of transient ischemic attack (TIA), and cerebral infarction without residual deficits: Secondary | ICD-10-CM | POA: Insufficient documentation

## 2022-04-30 DIAGNOSIS — I4891 Unspecified atrial fibrillation: Secondary | ICD-10-CM

## 2022-04-30 LAB — CBC
HCT: 41.9 % (ref 36.0–46.0)
Hemoglobin: 14.1 g/dL (ref 12.0–15.0)
MCH: 31.8 pg (ref 26.0–34.0)
MCHC: 33.7 g/dL (ref 30.0–36.0)
MCV: 94.6 fL (ref 80.0–100.0)
Platelets: 200 K/uL (ref 150–400)
RBC: 4.43 MIL/uL (ref 3.87–5.11)
RDW: 12.2 % (ref 11.5–15.5)
WBC: 7.6 K/uL (ref 4.0–10.5)
nRBC: 0 % (ref 0.0–0.2)

## 2022-04-30 LAB — BASIC METABOLIC PANEL WITH GFR
Anion gap: 8 (ref 5–15)
BUN: 19 mg/dL (ref 8–23)
CO2: 22 mmol/L (ref 22–32)
Calcium: 9.2 mg/dL (ref 8.9–10.3)
Chloride: 109 mmol/L (ref 98–111)
Creatinine, Ser: 1.24 mg/dL — ABNORMAL HIGH (ref 0.44–1.00)
GFR, Estimated: 46 mL/min — ABNORMAL LOW
Glucose, Bld: 103 mg/dL — ABNORMAL HIGH (ref 70–99)
Potassium: 4.5 mmol/L (ref 3.5–5.1)
Sodium: 139 mmol/L (ref 135–145)

## 2022-04-30 MED ORDER — SPIRONOLACTONE 25 MG PO TABS: 12.5000 mg | ORAL_TABLET | Freq: Every morning | ORAL | 3 refills | Status: DC

## 2022-04-30 NOTE — H&P (View-Only) (Signed)
Primary Care Physician: Nicholos Johns, MD Referring Physician: Dr. Thornton Papas [name] is a 76 y.o. female with a h/o prior CVA, CHF<  afib that is one month s/p afib abaltion. She felt well initially after the procedure but now has felt heart irregularity for several weeks. Ekg shows today afib with controlled rate. She  has noted since being in afib that her am BP's have been on the low side, at times 21-30 systolic. For the last week, she cut her metoprolol in 1/2 at hs and BP as been slightly better. She drinks ample water.   Today, she denies symptoms of palpitations, chest pain, shortness of breath, orthopnea, PND, lower extremity edema, dizziness, presyncope, syncope, or neurologic sequela. The patient is tolerating medications without difficulties and is otherwise without complaint today.   Past Medical History:  Diagnosis Date   Atrial fibrillation ([place])    Breast cancer ([place])    Cardiomyopathy ([place])    Coronary artery disease    Hyperlipidemia    Hypertension    Past Surgical History:  Procedure Laterality Date   ATRIAL FIBRILLATION ABLATION N/A 04/02/2022   Procedure: ATRIAL FIBRILLATION ABLATION;  Surgeon: Vickie Epley, MD;  Location: [place] [facility];  Service: Cardiovascular;  Laterality: N/A;   BUBBLE STUDY  08/05/2021   Procedure: BUBBLE STUDY;  Surgeon: Rex Kras, DO;  Location: [place];  Service: Cardiovascular;;   CARDIOVERSION N/A 08/05/2021   Procedure: CARDIOVERSION;  Surgeon: Rex Kras, DO;  Location: [place] [facility];  Service: Cardiovascular;  Laterality: N/A;   CORONARY ARTERY BYPASS GRAFT     TEE WITHOUT CARDIOVERSION N/A 08/05/2021   Procedure: TRANSESOPHAGEAL ECHOCARDIOGRAM (TEE);  Surgeon: Rex Kras, DO;  [facility]: [facility];  Service: Cardiovascular;  Laterality: N/A;    Current Outpatient Medications  Medication Sig Dispense Refill   albuterol (VENTOLIN HFA) 108 (90 Base) MCG/ACT inhaler Inhale 2 puffs into the lungs as  needed for wheezing or shortness of breath.     apixaban (ELIQUIS) 5 MG TABS tablet Take 1 tablet (5 mg total) by mouth 2 (two) times daily. 180 tablet 3   aspirin 81 MG EC tablet Take 81 mg by mouth daily.     cholecalciferol (VITAMIN D3) 25 MCG (1000 UNIT) tablet Take 1,000 Units by mouth daily.     dapagliflozin propanediol (FARXIGA) 10 MG TABS tablet Take 1 tablet (10 mg total) by mouth daily before breakfast. 90 tablet 1   esomeprazole (NEXIUM) 40 MG capsule Take 40 mg by mouth daily.     famotidine (PEPCID) 20 MG tablet Take 20 mg by mouth as needed for heartburn.     fluticasone (FLONASE) 50 MCG/ACT nasal spray Place 1 spray into both nostrils daily as needed for allergies or rhinitis.     levothyroxine (SYNTHROID) 125 MCG tablet Take 125 mcg by mouth daily before breakfast.     loratadine (CLARITIN) 10 MG tablet Take 10 mg by mouth daily.     metoprolol succinate (TOPROL-XL) 50 MG 24 hr tablet Take 1 tablet (50 mg total) by mouth daily. Hold if systolic blood pressure (top number) less than 100 mmHg or pulse less than 60 bpm. 90 tablet 0   Multiple Vitamins-Minerals (MULTIVITAMIN WITH MINERALS) tablet Take 1 tablet by mouth daily.     rosuvastatin (CRESTOR) 10 MG tablet Take 10 mg by mouth daily.     sacubitril-valsartan (ENTRESTO) 97-103 MG Take 1 tablet by mouth 2 (two) times daily. 180 tablet 1   spironolactone (ALDACTONE) 25  MG tablet Take 1 tablet (25 mg total) by mouth every morning. 90 tablet 3   SYMBICORT 160-4.5 MCG/ACT inhaler Inhale 2 puffs into the lungs 2 (two) times daily.     vitamin B-12 (CYANOCOBALAMIN) 1000 MCG tablet Take 1,000 mcg by mouth daily.     vitamin C (ASCORBIC ACID) 500 MG tablet Take 500 mg by mouth daily.     No current facility-administered medications for this encounter.    Allergies  Allergen Reactions   Cephalexin Rash        Montelukast Sodium Swelling    Swelling of the throat, could no longer swallow pills   Atenolol Swelling   Irbesartan  Cough   Lisinopril Cough    Social History   Socioeconomic History   Marital status: Divorced    Spouse name: Not on file   Number of children: Not on file   Years of education: Not on file   Highest education level: Not on file  Occupational History   Not on file  Tobacco Use   Smoking status: Former    Packs/day: 1.50    Years: 30.00    Total pack years: 45.00    Types: Cigarettes    Quit date: 2013    Years since quitting: 10.4   Smokeless tobacco: Never   Tobacco comments:    Former smoker 04/30/22  Vaping Use   Vaping Use: Never used  Substance and Sexual Activity   Alcohol use: Never   Drug use: Never   Sexual activity: Not Currently  Other Topics Concern   Not on file  Social History Narrative   Not on file   Social Determinants of Health   Financial Resource Strain: Not on file  Food Insecurity: Not on file  Transportation Needs: Not on file  Physical Activity: Not on file  Stress: Not on file  Social Connections: Not on file  Intimate Partner Violence: Not on file    Family History  Problem Relation Age of Onset   Heart attack Father     ROS- All systems are reviewed and negative except as per the HPI above  Physical Exam: Vitals:   04/30/22 1331  BP: 124/84  Pulse: 95  Weight: 86.7 kg  Height: '5\' 2"'$  (1.575 m)   Wt Readings from Last 3 Encounters:  04/30/22 86.7 kg  01/19/22 86.5 kg  12/09/21 87.1 kg    Labs: Lab Results  Component Value Date   NA 138 03/12/2022   K 4.5 03/12/2022   CL 102 03/12/2022   CO2 22 03/12/2022   GLUCOSE 101 (H) 03/12/2022   BUN 19 03/12/2022   CREATININE 1.18 (H) 03/12/2022   CALCIUM 9.4 03/12/2022   MG 2.0 08/28/2021   Lab Results  Component Value Date   INR 1.0 05/22/2021   Lab Results  Component Value Date   CHOL 143 05/23/2021   HDL 49 05/23/2021   LDLCALC 73 05/23/2021   TRIG 106 05/23/2021     GEN- The patient is well appearing, alert and oriented x 3 today.   Head- normocephalic,  atraumatic Eyes-  Sclera clear, conjunctiva pink Ears- hearing intact Oropharynx- clear Neck- supple, no JVP Lymph- no cervical lymphadenopathy Lungs- Clear to ausculation bilaterally, normal work of breathing Heart- irregular rate and rhythm, no murmurs, rubs or gallops, PMI not laterally displaced GI- soft, NT, ND, + BS Extremities- no clubbing, cyanosis, or edema MS- no significant deformity or atrophy Skin- no rash or lesion Psych- euthymic mood, full affect Neuro- strength  and sensation are intact  EKG-afib at 95 bpm, qrs int 68 ms, qtc 437 ms.     Assessment and Plan:  1. Afib S/p afib ablation one month ago She has been back in afib for at least the last 2 weeks Discussed proceeding with cardioversion, risk vrs benefit  and she is in agreement  Bmet/cbc  2. CHA2DS2VASc  score of at least 6 She has not missed any of her eliquis 5 mg bid for at least 3 weeks  3. Hypotension Afib may be contributing She on her own cut back metoprolol to 25 mg at hs and BP somewhat better in am Since she is rate controlled can continue this Try cutting spironolactone in half in the am as well Watch daily weights for any fluid weight gain If on return to SR, her BP's stabilize can go back to previous doses   F/u in afib clinic one week after cardioversion   Leah Day, [facility] [address] [place], [place] [address] [phone]

## 2022-05-03 ENCOUNTER — Encounter (HOSPITAL_COMMUNITY): Payer: Self-pay | Admitting: Cardiology

## 2022-05-12 ENCOUNTER — Encounter (HOSPITAL_COMMUNITY): Payer: Self-pay | Admitting: Cardiology

## 2022-05-12 ENCOUNTER — Ambulatory Visit (HOSPITAL_BASED_OUTPATIENT_CLINIC_OR_DEPARTMENT_OTHER): Payer: Medicare HMO | Admitting: Anesthesiology

## 2022-05-12 ENCOUNTER — Encounter (HOSPITAL_COMMUNITY)
Admission: RE | Disposition: A | Discharge status: Home / Self Care | Payer: Self-pay | Source: Home / Self Care | Attending: Cardiology

## 2022-05-12 ENCOUNTER — Other Ambulatory Visit: Payer: Self-pay

## 2022-05-12 ENCOUNTER — Ambulatory Visit (HOSPITAL_COMMUNITY): Payer: Medicare HMO | Admitting: Anesthesiology

## 2022-05-12 ENCOUNTER — Ambulatory Visit (HOSPITAL_COMMUNITY)
Admission: RE | Admit: 2022-05-12 | Discharge: 2022-05-12 | Disposition: A | Discharge status: Home / Self Care | Payer: Medicare HMO | Attending: Cardiology | Admitting: Cardiology

## 2022-05-12 DIAGNOSIS — I4891 Unspecified atrial fibrillation: Secondary | ICD-10-CM | POA: Diagnosis not present

## 2022-05-12 DIAGNOSIS — I509 Heart failure, unspecified: Secondary | ICD-10-CM

## 2022-05-12 DIAGNOSIS — Z87891 Personal history of nicotine dependence: Secondary | ICD-10-CM | POA: Insufficient documentation

## 2022-05-12 DIAGNOSIS — I110 Hypertensive heart disease with heart failure: Secondary | ICD-10-CM | POA: Diagnosis not present

## 2022-05-12 DIAGNOSIS — I2510 Atherosclerotic heart disease of native coronary artery without angina pectoris: Secondary | ICD-10-CM | POA: Diagnosis not present

## 2022-05-12 DIAGNOSIS — Z7901 Long term (current) use of anticoagulants: Secondary | ICD-10-CM | POA: Insufficient documentation

## 2022-05-12 DIAGNOSIS — Z79899 Other long term (current) drug therapy: Secondary | ICD-10-CM | POA: Diagnosis not present

## 2022-05-12 DIAGNOSIS — I959 Hypotension, unspecified: Secondary | ICD-10-CM | POA: Insufficient documentation

## 2022-05-12 HISTORY — PX: CARDIOVERSION: SHX1299

## 2022-05-12 SURGERY — CARDIOVERSION
Anesthesia: General

## 2022-05-12 MED ORDER — PROPOFOL 10 MG/ML IV BOLUS
INTRAVENOUS | Status: DC | PRN
  Administered 2022-05-12: 50 mg via INTRAVENOUS

## 2022-05-12 MED ORDER — LIDOCAINE 2% (20 MG/ML) 5 ML SYRINGE
INTRAMUSCULAR | Status: DC | PRN
  Administered 2022-05-12: 60 mg via INTRAVENOUS

## 2022-05-12 MED ORDER — SODIUM CHLORIDE 0.9 % IV SOLN: INTRAVENOUS | Status: DC

## 2022-05-12 NOTE — CV Procedure (Signed)
Procedure: Electrical Cardioversion Indications:  Atrial Fibrillation  Procedure Details:  Consent: Risks of procedure as well as the alternatives and risks of each were explained to the (patient/caregiver).  Consent for procedure obtained.  Time Out: Verified patient identification, verified procedure, site/side was marked, verified correct patient position, special equipment/implants available, medications/allergies/relevent history reviewed, required imaging and test results available. PERFORMED.  Patient placed on cardiac monitor, pulse oximetry, supplemental oxygen as necessary.  Sedation given:  Propofol '50mg'$ ; lidocaine '60mg'$   Pacer pads placed anterior and posterior chest.  Cardioverted 1 time(s).  Cardioversion with synchronized biphasic 150J shock.  Evaluation: Findings: Post procedure EKG shows: NSR Complications: None Patient did tolerate procedure well.  Time Spent Directly with the Patient:  53mnutes   HFreada Bergeron7/03/2022, 12:47 PM

## 2022-05-12 NOTE — Interval H&P Note (Signed)
History and Physical Interval Note:  05/12/2022 12:19 PM  Leah Day  has presented today for surgery, with the diagnosis of ATRIAL FIBRILLATION.  The various methods of treatment have been discussed with the patient and family. After consideration of risks, benefits and other options for treatment, the patient has consented to  Procedure(s): CARDIOVERSION (N/A) as a surgical intervention.  The patient's history has been reviewed, patient examined, no change in status, stable for surgery.  I have reviewed the patient's chart and labs.  Questions were answered to the patient's satisfaction.     Freada Bergeron

## 2022-05-12 NOTE — Anesthesia Procedure Notes (Signed)
Procedure Name: General with mask airway Date/Time: 05/12/2022 12:45 PM  Performed by: Erick Colace, CRNAPre-anesthesia Checklist: Patient identified, Emergency Drugs available, Suction available and Patient being monitored Patient Re-evaluated:Patient Re-evaluated prior to induction Oxygen Delivery Method: Ambu bag Preoxygenation: Pre-oxygenation with 100% oxygen Induction Type: IV induction Dental Injury: Teeth and Oropharynx as per pre-operative assessment

## 2022-05-12 NOTE — Transfer of Care (Signed)
Immediate Anesthesia Transfer of Care Note  Patient: Leah Day  Procedure(s) Performed: CARDIOVERSION  Patient Location: Short Stay  Anesthesia Type:MAC  Level of Consciousness: drowsy  Airway & Oxygen Therapy: Patient Spontanous Breathing  Post-op Assessment: Report given to RN and Post -op Vital signs reviewed and stable  Post vital signs: Reviewed and stable  Last Vitals:  Vitals Value Taken Time  BP 107/65   Temp    Pulse 71   Resp 12   SpO2 100     Last Pain:  Vitals:   05/12/22 1206  TempSrc: Temporal  PainSc: 0-No pain         Complications: No notable events documented.

## 2022-05-12 NOTE — Anesthesia Postprocedure Evaluation (Signed)
Anesthesia Post Note  Patient: Leah Day  Procedure(s) Performed: CARDIOVERSION     Patient location during evaluation: Endoscopy Anesthesia Type: General Level of consciousness: awake Pain management: pain level controlled Vital Signs Assessment: post-procedure vital signs reviewed and stable Respiratory status: spontaneous breathing, nonlabored ventilation, respiratory function stable and patient connected to nasal cannula oxygen Cardiovascular status: blood pressure returned to baseline and stable Postop Assessment: no apparent nausea or vomiting Anesthetic complications: no   No notable events documented.  Last Vitals:  Vitals:   05/12/22 1315 05/12/22 1320  BP: 116/81 118/89  Pulse: 62 66  Resp: 15 (!) 22  Temp:    SpO2: 97% 99%    Last Pain:  Vitals:   05/12/22 1251  TempSrc: Temporal  PainSc: 0-No pain                 Jnya Brossard P Jonica Bickhart

## 2022-05-12 NOTE — Anesthesia Preprocedure Evaluation (Addendum)
Anesthesia Evaluation  Patient identified by MRN, date of birth, ID band Patient awake    Reviewed: Allergy & Precautions, NPO status , Patient's Chart, lab work & pertinent test results  Airway Mallampati: III  TM Distance: >3 FB Neck ROM: Full    Dental  (+) Missing, Loose,    Pulmonary former smoker,    Pulmonary exam normal        Cardiovascular hypertension, Pt. on home beta blockers + CAD, + CABG and +CHF  Normal cardiovascular exam+ dysrhythmias      Neuro/Psych CVA, No Residual Symptoms negative psych ROS   GI/Hepatic Neg liver ROS, GERD  Medicated and Controlled,  Endo/Other  Hypothyroidism   Renal/GU Renal disease     Musculoskeletal negative musculoskeletal ROS (+)   Abdominal   Peds  Hematology  (+) Blood dyscrasia, ,   Anesthesia Other Findings ATRIAL FIBRILLATION  Reproductive/Obstetrics                          Anesthesia Physical Anesthesia Plan  ASA: 3  Anesthesia Plan: General   Post-op Pain Management:    Induction: Intravenous  PONV Risk Score and Plan: 3 and Propofol infusion and Treatment may vary due to age or medical condition  Airway Management Planned: Simple Face Mask  Additional Equipment:   Intra-op Plan:   Post-operative Plan:   Informed Consent: I have reviewed the patients History and Physical, chart, labs and discussed the procedure including the risks, benefits and alternatives for the proposed anesthesia with the patient or authorized representative who has indicated his/her understanding and acceptance.     Dental advisory given  Plan Discussed with: CRNA  Anesthesia Plan Comments:        Anesthesia Quick Evaluation

## 2022-05-19 ENCOUNTER — Ambulatory Visit (HOSPITAL_COMMUNITY)
Admission: RE | Admit: 2022-05-19 | Discharge: 2022-05-19 | Disposition: A | Discharge status: Home / Self Care | Payer: Medicare HMO | Source: Ambulatory Visit | Attending: Nurse Practitioner | Admitting: Nurse Practitioner

## 2022-05-19 ENCOUNTER — Encounter (HOSPITAL_COMMUNITY): Payer: Self-pay | Admitting: Nurse Practitioner

## 2022-05-19 VITALS — BP 140/70 | HR 55 | Ht 62.0 in | Wt 193.8 lb

## 2022-05-19 DIAGNOSIS — I959 Hypotension, unspecified: Secondary | ICD-10-CM | POA: Diagnosis not present

## 2022-05-19 DIAGNOSIS — D6869 Other thrombophilia: Secondary | ICD-10-CM

## 2022-05-19 DIAGNOSIS — I4891 Unspecified atrial fibrillation: Secondary | ICD-10-CM | POA: Diagnosis not present

## 2022-05-19 DIAGNOSIS — Z7901 Long term (current) use of anticoagulants: Secondary | ICD-10-CM | POA: Insufficient documentation

## 2022-05-19 DIAGNOSIS — I4819 Other persistent atrial fibrillation: Secondary | ICD-10-CM

## 2022-05-19 DIAGNOSIS — I5022 Chronic systolic (congestive) heart failure: Secondary | ICD-10-CM

## 2022-05-19 DIAGNOSIS — Z8673 Personal history of transient ischemic attack (TIA), and cerebral infarction without residual deficits: Secondary | ICD-10-CM | POA: Insufficient documentation

## 2022-05-19 MED ORDER — METOPROLOL SUCCINATE ER 50 MG PO TB24: 25.0000 mg | ORAL_TABLET | Freq: Every day | ORAL | Status: DC

## 2022-05-19 NOTE — Progress Notes (Addendum)
Primary Care Physician: Leah Johns, MD Referring Physician: Dr. Thornton Papas Day is a 76 y.o. female with a h/o prior CVA, CHF, afib that is one month s/p afib abaltion. She felt well initially after the procedure but now has felt heart irregularity for several weeks. Ekg shows today afib with controlled rate. She  has noted since being in afib that her am BP's have been on the low side, at times 62-22 systolic. For the last week, she cut her metoprolol in 1/2 at hs and BP as been slightly better. She drinks ample water.   She is in afib clinic 05/12/22 for f/u successful cardioversion. EKG today shows SR.   Today, she denies symptoms of palpitations, chest pain, shortness of breath, orthopnea, PND, lower extremity edema, dizziness, presyncope, syncope, or neurologic sequela. The patient is tolerating medications without difficulties and is otherwise without complaint today.   Past Medical History:  Diagnosis Date   Atrial fibrillation ([place])    Breast cancer ([place])    Cardiomyopathy ([place])    Coronary artery disease    Hyperlipidemia    Hypertension    Past Surgical History:  Procedure Laterality Date   ATRIAL FIBRILLATION ABLATION N/A 04/02/2022   Procedure: ATRIAL FIBRILLATION ABLATION;  Surgeon: Leah Epley, MD;  Location: [facility];  Service: Cardiovascular;  Laterality: N/A;   BUBBLE STUDY  08/05/2021   Procedure: BUBBLE STUDY;  Surgeon: Leah Kras, DO;  Location: [facility] [place];  Service: Cardiovascular;;   CARDIOVERSION N/A 08/05/2021   Procedure: CARDIOVERSION;  Surgeon: Leah Kras, DO;  Location: [facility];  Service: Cardiovascular;  Laterality: N/A;   CARDIOVERSION N/A 05/12/2022   Procedure: CARDIOVERSION;  Surgeon: Leah Bergeron, MD;  Location: [facility];  Service: Cardiovascular;  Laterality: N/A;   CORONARY ARTERY BYPASS GRAFT     TEE WITHOUT CARDIOVERSION N/A 08/05/2021   Procedure: TRANSESOPHAGEAL ECHOCARDIOGRAM (TEE);  Surgeon:  Leah Kras, DO;  Location[facility];  Service: Cardiovascular;  Laterality: N/A;    Current Outpatient Medications  Medication Sig Dispense Refill   albuterol (VENTOLIN HFA) 108 (90 Base) MCG/ACT inhaler Inhale 2 puffs into the lungs daily as needed (cough).     apixaban (ELIQUIS) 5 MG TABS tablet Take 1 tablet (5 mg total) by mouth 2 (two) times daily. 180 tablet 3   aspirin 81 MG EC tablet Take 81 mg by mouth daily.     cholecalciferol (VITAMIN D3) 25 MCG (1000 UNIT) tablet Take 1,000 Units by mouth daily.     dapagliflozin propanediol (FARXIGA) 10 MG TABS tablet Take 1 tablet (10 mg total) by mouth daily before breakfast. 90 tablet 1   esomeprazole (NEXIUM) 40 MG capsule Take 40 mg by mouth daily.     famotidine (PEPCID) 20 MG tablet Take 20 mg by mouth as needed for heartburn.     levothyroxine (SYNTHROID) 125 MCG tablet Take 125 mcg by mouth daily before breakfast.     loratadine (CLARITIN) 10 MG tablet Take 10 mg by mouth daily.     metoprolol succinate (TOPROL-XL) 50 MG 24 hr tablet Take 1 tablet (50 mg total) by mouth daily. Hold if systolic blood pressure (top number) less than 100 mmHg or pulse less than 60 bpm. (Patient taking differently: Take 25 mg by mouth daily before supper. Hold if systolic blood pressure (top number) less than 100 mmHg or pulse less than 60 bpm.) 90 tablet 0   Multiple Vitamins-Minerals (MULTIVITAMIN WITH MINERALS) tablet Take 1 tablet by mouth  daily.     rosuvastatin (CRESTOR) 10 MG tablet Take 10 mg by mouth daily.     sacubitril-valsartan (ENTRESTO) 97-103 MG Take 1 tablet by mouth 2 (two) times daily. 180 tablet 1   spironolactone (ALDACTONE) 25 MG tablet Take 0.5 tablets (12.5 mg total) by mouth every morning. 90 tablet 3   SYMBICORT 160-4.5 MCG/ACT inhaler Inhale 2 puffs into the lungs daily as needed (Cough).     vitamin B-12 (CYANOCOBALAMIN) 1000 MCG tablet Take 1,000 mcg by mouth daily.     vitamin C (ASCORBIC ACID) 500 MG tablet Take 500 mg by  mouth daily.     No current facility-administered medications for this encounter.    Allergies  Allergen Reactions   Cephalexin Rash        Montelukast Sodium Swelling    Swelling of the throat, could no longer swallow pills   Atenolol Swelling   Irbesartan Cough   Lisinopril Cough    Social History   Socioeconomic History   Marital status: Divorced    Spouse name: Not on file   Number of children: Not on file   Years of education: Not on file   Highest education level: Not on file  Occupational History   Not on file  Tobacco Use   Smoking status: Former    Packs/day: 1.50    Years: 30.00    Total pack years: 45.00    Types: Cigarettes    Quit date: 2013    Years since quitting: 10.5   Smokeless tobacco: Never   Tobacco comments:    Former smoker 04/30/22  Vaping Use   Vaping Use: Never used  Substance and Sexual Activity   Alcohol use: Never   Drug use: Never   Sexual activity: Not Currently  Other Topics Concern   Not on file  Social History Narrative   Not on file   Social Determinants of Health   Financial Resource Strain: Not on file  Food Insecurity: Not on file  Transportation Needs: Not on file  Physical Activity: Not on file  Stress: Not on file  Social Connections: Not on file  Intimate Partner Violence: Not on file    Family History  Problem Relation Age of Onset   Heart attack Father     ROS- All systems are reviewed and negative except as per the HPI above  Physical Exam: Vitals:   05/19/22 1322  Height: '5\' 2"'$  (1.575 m)   Wt Readings from Last 3 Encounters:  05/12/22 86.2 kg  04/30/22 86.7 kg  01/19/22 86.5 kg    Labs: Lab Results  Component Value Date   NA 139 04/30/2022   K 4.5 04/30/2022   CL 109 04/30/2022   CO2 22 04/30/2022   GLUCOSE 103 (H) 04/30/2022   BUN 19 04/30/2022   CREATININE 1.24 (H) 04/30/2022   CALCIUM 9.2 04/30/2022   MG 2.0 08/28/2021   Lab Results  Component Value Date   INR 1.0 05/22/2021    Lab Results  Component Value Date   CHOL 143 05/23/2021   HDL 49 05/23/2021   LDLCALC 73 05/23/2021   TRIG 106 05/23/2021     GEN- The patient is well appearing, alert and oriented x 3 today.   Head- normocephalic, atraumatic Eyes-  Sclera clear, conjunctiva pink Ears- hearing intact Oropharynx- clear Neck- supple, no JVP Lymph- no cervical lymphadenopathy Lungs- Clear to ausculation bilaterally, normal work of breathing Heart- regular rate and rhythm, no murmurs, rubs or gallops, PMI not laterally  displaced GI- soft, NT, ND, + BS Extremities- no clubbing, cyanosis, or edema MS- no significant deformity or atrophy Skin- no rash or lesion Psych- euthymic mood, full affect Neuro- strength and sensation are intact  EKG- Vent. rate 55 BPM PR interval 170 ms QRS duration 70 ms QT/QTcB 400/382 ms P-R-T axes 75 -21 71 Sinus bradycardia Low voltage QRS Cannot rule out Anterior infarct , age undetermined Abnormal ECG When compared with ECG of 12-May-2022 12:52, PREVIOUS ECG IS PRESENT    Assessment and Plan:  1. Afib S/p afib ablation one month ago  ERAF Successful cardioversion 05/12/22 EKG today with SR   2. CHA2DS2VASc  score of at least 6 She has not missed any of her eliquis 5 mg bid   3. Hypotension Afib may have  been contributing Stable   F/u with Dr. Quentin Ore 8/23     Leah Day, [facility] [address] [place], [place] [address] [phone]

## 2022-05-20 DIAGNOSIS — Z79899 Other long term (current) drug therapy: Secondary | ICD-10-CM | POA: Diagnosis not present

## 2022-05-20 DIAGNOSIS — I10 Essential (primary) hypertension: Secondary | ICD-10-CM | POA: Diagnosis not present

## 2022-05-20 DIAGNOSIS — J309 Allergic rhinitis, unspecified: Secondary | ICD-10-CM | POA: Diagnosis not present

## 2022-05-20 DIAGNOSIS — Z9181 History of falling: Secondary | ICD-10-CM | POA: Diagnosis not present

## 2022-05-20 DIAGNOSIS — Z139 Encounter for screening, unspecified: Secondary | ICD-10-CM | POA: Diagnosis not present

## 2022-05-20 DIAGNOSIS — E559 Vitamin D deficiency, unspecified: Secondary | ICD-10-CM | POA: Diagnosis not present

## 2022-05-20 DIAGNOSIS — E785 Hyperlipidemia, unspecified: Secondary | ICD-10-CM | POA: Diagnosis not present

## 2022-05-20 DIAGNOSIS — E039 Hypothyroidism, unspecified: Secondary | ICD-10-CM | POA: Diagnosis not present

## 2022-05-20 DIAGNOSIS — Z8673 Personal history of transient ischemic attack (TIA), and cerebral infarction without residual deficits: Secondary | ICD-10-CM | POA: Diagnosis not present

## 2022-06-29 NOTE — Progress Notes (Unsigned)
Electrophysiology Office Follow up Visit Note:    Date:  06/29/2022   ID:  Leah Day, DOB [birth year], MRN [id number]  PCP:  Nicholos Johns, MD  [facility] Cardiologist:  None  [facility] Electrophysiologist:  None    Interval History:    Leah Day is a 76 y.o. female who presents for a follow up visit. She had an AF ablation 04/02/2022. During the ablation, the veins and posterior wall were ablated.  She saw Butch Penny in f/u 04/30/2022. At that appointment she was back in AF and a DCCV was planned. DCCV was performed 05/12/2022 and she successfully returned to NSR.        Past Medical History:  Diagnosis Date   Atrial fibrillation ([place])    Breast cancer ([place])    Cardiomyopathy ([place])    Coronary artery disease    Hyperlipidemia    Hypertension     Past Surgical History:  Procedure Laterality Date   ATRIAL FIBRILLATION ABLATION N/A 04/02/2022   Procedure: ATRIAL FIBRILLATION ABLATION;  Surgeon: Vickie Epley, MD;  Location: [facility];  Service: Cardiovascular;  Laterality: N/A;   BUBBLE STUDY  08/05/2021   Procedure: BUBBLE STUDY;  Surgeon: Rex Kras, DO;  Location: [place];  Service: Cardiovascular;;   CARDIOVERSION N/A 08/05/2021   Procedure: CARDIOVERSION;  Surgeon: Rex Kras, DO;  Location: [place] [facility];  Service: Cardiovascular;  Laterality: N/A;   CARDIOVERSION N/A 05/12/2022   Procedure: CARDIOVERSION;  Surgeon: Freada Bergeron, MD;  Location: [facility];  Service: Cardiovascular;  Laterality: N/A;   CORONARY ARTERY BYPASS GRAFT     TEE WITHOUT CARDIOVERSION N/A 08/05/2021   Procedure: TRANSESOPHAGEAL ECHOCARDIOGRAM (TEE);  Surgeon: Rex Kras, DO;  Location: [facility];  Service: Cardiovascular;  Laterality: N/A;    Current Medications: No outpatient medications have been marked as taking for the 06/30/22 encounter (Appointment) with Vickie Epley, MD.     Allergies:   Cephalexin, Montelukast sodium, Atenolol,  Irbesartan, and Lisinopril   Social History   Socioeconomic History   Marital status: Divorced    Spouse name: Not on file   Number of children: Not on file   Years of education: Not on file   Highest education level: Not on file  Occupational History   Not on file  Tobacco Use   Smoking status: Former    Packs/day: 1.50    Years: 30.00    Total pack years: 45.00    Types: Cigarettes    Quit date: 2013    Years since quitting: 10.6   Smokeless tobacco: Never   Tobacco comments:    Former smoker 04/30/22  Vaping Use   Vaping Use: Never used  Substance and Sexual Activity   Alcohol use: Never   Drug use: Never   Sexual activity: Not Currently  Other Topics Concern   Not on file  Social History Narrative   Not on file   Social Determinants of Health   Financial Resource Strain: Not on file  Food Insecurity: Not on file  Transportation Needs: Not on file  Physical Activity: Not on file  Stress: Not on file  Social Connections: Not on file     Family History: The patient's family history includes Heart attack in her father.  ROS:   Please see the history of present illness.    All other systems reviewed and are negative.  EKGs/Labs/Other Studies Reviewed:    The following studies were reviewed today:   EKG:  The ekg ordered today demonstrates ***  Recent Labs: 08/28/2021: Magnesium 2.0; NT-Pro BNP 1,335 04/30/2022: BUN 19; Creatinine, Ser 1.24; Hemoglobin 14.1; Platelets 200; Potassium 4.5; Sodium 139  Recent Lipid Panel    Component Value Date/Time   CHOL 143 05/23/2021 1914   TRIG 106 05/23/2021 1914   HDL 49 05/23/2021 1914   CHOLHDL 2.9 05/23/2021 1914   VLDL 21 05/23/2021 1914   [place] 73 05/23/2021 1914    Physical Exam:    VS:  There were no vitals taken for this visit.    Wt Readings from Last 3 Encounters:  05/19/22 193 lb 12.8 oz (87.9 kg)  05/12/22 190 lb 0.6 oz (86.2 kg)  04/30/22 191 lb 3.2 oz (86.7 kg)     GEN:  Well  nourished, well developed in no acute distress HEENT: Normal NECK: No JVD; No carotid bruits LYMPHATICS: No lymphadenopathy CARDIAC: ***RRR, no murmurs, rubs, gallops RESPIRATORY:  Clear to auscultation without rales, wheezing or rhonchi  ABDOMEN: Soft, non-tender, non-distended MUSCULOSKELETAL:  No edema; No deformity  SKIN: Warm and dry NEUROLOGIC:  Alert and oriented x 3 PSYCHIATRIC:  Normal affect        ASSESSMENT:    No diagnosis found. PLAN:    In order of problems listed above:    ?NSR       Total time spent with patient today *** minutes. This includes reviewing records, evaluating the patient and coordinating care.   Medication Adjustments/Labs and Tests Ordered: Current medicines are reviewed at length with the patient today.  Concerns regarding medicines are outlined above.  No orders of the defined types were placed in this encounter.  No orders of the defined types were placed in this encounter.    Signed, Lars Mage, MD, [facility], [facility] 06/29/2022 9:32 PM    Electrophysiology [facility]

## 2022-06-30 ENCOUNTER — Ambulatory Visit: Payer: Medicare HMO | Admitting: Cardiology

## 2022-06-30 ENCOUNTER — Encounter: Payer: Self-pay | Admitting: Cardiology

## 2022-06-30 ENCOUNTER — Telehealth: Payer: Self-pay | Admitting: *Deleted

## 2022-06-30 VITALS — BP 136/74 | HR 70 | Ht 62.0 in | Wt 189.0 lb

## 2022-06-30 DIAGNOSIS — I4819 Other persistent atrial fibrillation: Secondary | ICD-10-CM

## 2022-06-30 DIAGNOSIS — R319 Hematuria, unspecified: Secondary | ICD-10-CM

## 2022-06-30 DIAGNOSIS — I639 Cerebral infarction, unspecified: Secondary | ICD-10-CM | POA: Diagnosis not present

## 2022-06-30 DIAGNOSIS — I5022 Chronic systolic (congestive) heart failure: Secondary | ICD-10-CM | POA: Diagnosis not present

## 2022-06-30 NOTE — Telephone Encounter (Signed)
Left message to call back.   Patient needs CBC lab, not CMP.

## 2022-06-30 NOTE — Patient Instructions (Addendum)
Medication Instructions:  none *If you need a refill on your cardiac medications before your next appointment, please call your pharmacy*   Lab Work: CBC If you have labs (blood work) drawn today and your tests are completely normal, you will receive your results only by: [place] (if you have MyChart) OR A paper copy in the mail If you have any lab test that is abnormal or we need to change your treatment, we will call you to review the results.   Testing/Procedures: none   Follow-Up: At [facility], you and your health needs are our priority.  As part of our continuing mission to provide you with exceptional heart care, we have created designated Provider Care Teams.  These Care Teams include your primary Cardiologist (physician) and Advanced Practice Providers (APPs -  Physician Assistants and Nurse Practitioners) who all work together to provide you with the care you need, when you need it.  We recommend signing up for the patient portal called "MyChart".  Sign up information is provided on this After Visit Summary.  MyChart is used to connect with patients for Virtual Visits (Telemedicine).  Patients are able to view lab/test results, encounter notes, upcoming appointments, etc.  Non-urgent messages can be sent to your provider as well.   To learn more about what you can do with MyChart, go to [email or web address].    Your next appointment:   6 month(s)  The format for your next appointment:   In Person  Provider:   You will see one of the following Advanced Practice Providers on your designated Care Team:   Tommye Standard, [place] Legrand Como "Jonni Sanger" Chalmers Cater, [place] 1}    Other Instructions none Important Information About Sugar

## 2022-06-30 NOTE — Addendum Note (Signed)
Addended by: Darrell Jewel on: 06/30/2022 02:56 PM   Modules accepted: Orders

## 2022-06-30 NOTE — Progress Notes (Signed)
Electrophysiology Office Follow up Visit Note:    Date:  06/30/2022   ID:  Leah Day, DOB November 18, 1945, MRN [id number]  PCP:  Nicholos Johns, MD  [facility] Cardiologist:  None  [facility] Electrophysiologist:  None    Interval History:    Leah Day is a 76 y.o. female who presents for a follow up visit. She had an AF ablation 04/02/2022. During the ablation, the veins and posterior wall were ablated.  She saw Butch Penny in f/u 04/30/2022. At that appointment she was back in AF and a DCCV was planned. DCCV was performed 05/12/2022 and she successfully returned to NSR.   Today:  Since her cardioversion in July, her rhythm has held steady. There were three times her heart rate elevated slightly, but never for long.  Yesterday morning, she urinated "bright-red" blood, but she did not have any pain. Since then, she has urinated blood three times.   She has a previous history of kidney stones.  She has occasional back pain.  She denies any chest pain, shortness of breath, or peripheral edema. No lightheadedness, headaches, syncope, orthopnea, or PND.     Past Medical History:  Diagnosis Date   Atrial fibrillation ([place])    Breast cancer ([place])    Cardiomyopathy ([place])    Coronary artery disease    Hyperlipidemia    Hypertension     Past Surgical History:  Procedure Laterality Date   ATRIAL FIBRILLATION ABLATION N/A 04/02/2022   Procedure: ATRIAL FIBRILLATION ABLATION;  Surgeon: Vickie Epley, MD;  Location: [place] [facility];  Service: Cardiovascular;  Laterality: N/A;   BUBBLE STUDY  08/05/2021   Procedure: BUBBLE STUDY;  Surgeon: Rex Kras, DO;  Location: [facility] [facility];  Service: Cardiovascular;;   CARDIOVERSION N/A 08/05/2021   Procedure: CARDIOVERSION;  Surgeon: Rex Kras, DO;  Location: [place] [facility];  Service: Cardiovascular;  Laterality: N/A;   CARDIOVERSION N/A 05/12/2022   Procedure: CARDIOVERSION;  Surgeon: Freada Bergeron, MD;  Location: [place];  Service: Cardiovascular;  Laterality: N/A;   CORONARY ARTERY BYPASS GRAFT     TEE WITHOUT CARDIOVERSION N/A 08/05/2021   Procedure: TRANSESOPHAGEAL ECHOCARDIOGRAM (TEE);  Surgeon: Rex Kras, DO;  Location: [facility];  Service: Cardiovascular;  Laterality: N/A;    Current Medications: Current Meds  Medication Sig   albuterol (VENTOLIN HFA) 108 (90 Base) MCG/ACT inhaler Inhale 2 puffs into the lungs daily as needed (cough).   apixaban (ELIQUIS) 5 MG TABS tablet Take 1 tablet (5 mg total) by mouth 2 (two) times daily.   aspirin 81 MG EC tablet Take 81 mg by mouth daily.   cholecalciferol (VITAMIN D3) 25 MCG (1000 UNIT) tablet Take 1,000 Units by mouth daily.   esomeprazole (NEXIUM) 40 MG capsule Take 40 mg by mouth daily.   famotidine (PEPCID) 20 MG tablet Take 20 mg by mouth as needed for heartburn.   levothyroxine (SYNTHROID) 125 MCG tablet Take 125 mcg by mouth daily before breakfast.   loratadine (CLARITIN) 10 MG tablet Take 10 mg by mouth daily.   metoprolol succinate (TOPROL-XL) 50 MG 24 hr tablet Take 0.5 tablets (25 mg total) by mouth daily before supper. Hold if systolic blood pressure (top number) less than 100 mmHg or pulse less than 60 bpm.   Multiple Vitamins-Minerals (MULTIVITAMIN WITH MINERALS) tablet Take 1 tablet by mouth daily.   rosuvastatin (CRESTOR) 10 MG tablet Take 10 mg by mouth daily.   sacubitril-valsartan (ENTRESTO) 97-103 MG Take 1 tablet by mouth 2 (two) times daily.  spironolactone (ALDACTONE) 25 MG tablet Take 0.5 tablets (12.5 mg total) by mouth every morning.   SYMBICORT 160-4.5 MCG/ACT inhaler Inhale 2 puffs into the lungs daily as needed (Cough).   vitamin B-12 (CYANOCOBALAMIN) 1000 MCG tablet Take 1,000 mcg by mouth daily.   vitamin C (ASCORBIC ACID) 500 MG tablet Take 500 mg by mouth daily.     Allergies:   Cephalexin, Montelukast sodium, Atenolol, Irbesartan, and Lisinopril   Social History   Socioeconomic History   Marital status:  Divorced    Spouse name: Not on file   Number of children: Not on file   Years of education: Not on file   Highest education level: Not on file  Occupational History   Not on file  Tobacco Use   Smoking status: Former    Packs/day: 1.50    Years: 30.00    Total pack years: 45.00    Types: Cigarettes    Quit date: 2013    Years since quitting: 10.6   Smokeless tobacco: Never   Tobacco comments:    Former smoker 04/30/22  Vaping Use   Vaping Use: Never used  Substance and Sexual Activity   Alcohol use: Never   Drug use: Never   Sexual activity: Not Currently  Other Topics Concern   Not on file  Social History Narrative   Not on file   Social Determinants of Health   Financial Resource Strain: Not on file  Food Insecurity: Not on file  Transportation Needs: Not on file  Physical Activity: Not on file  Stress: Not on file  Social Connections: Not on file     Family History: The patient's family history includes Heart attack in her father.  ROS:   Please see the history of present illness.  (+)Hematuria (+)Back pain     All other systems reviewed and are negative.  EKGs/Labs/Other Studies Reviewed:    The following studies were reviewed today:  Atrial Fibrillation Ablation 04/02/22:  CONCLUSIONS: 1. Successful PVI 2. Successful ablation/isolation of the posterior wall 3. Intracardiac echo reveals trivial pericardial effusion, dilated LA 4. No early apparent complications. 5. Colchicine 0.'6mg'$  PO BID x 5 days 6. Protonix '40mg'$  PO daily x 45 days  Cardiac CT 03/23/22:  IMPRESSION:  1. Small distal paraesophageal hiatal hernia. Distal esophageal wall thickening, esophagitis would be a common cause. 2. 4 mm subpleural nodule in the right lower lobe.  No routine follow-up imaging is recommended per Fleischner Society Guidelines.   PVC Echo 09/30/21:  Echocardiogram 09/30/2021:  Low normal LV systolic function with visual EF 50-55%. Left ventricle   cavity is normal in size. Normal left ventricular wall thickness. Normal  global wall motion. Abnormal septal wall motion due to post-operative  septum. Unable to evaluate diastolic function due to atrial fibrillation.  Left atrial cavity is mildly dilated.  No significant valvular heart disease.  Compared to study 05/23/2021: LVEF improved from 30-35% to 50-55%  otherwise no significant change.   PVC Myocardial Perfusion 09/30/21:  Lexiscan Tetrofosmin stress test  Day Rest/Stress Protocol. Stress EKG is non-diagnostic, as this is pharmacological stress test using Lexiscan. Normal myocardial perfusion without evidence of reversible myocardial ischemia or prior infarct. Left ventricular size and wall thickness preserved. Calculated LVEF 45% , may be reduced secondary to underlying A. fib.  Consider transthoracic echo to reevaluate LVEF. No previous exam available for comparison. Low risk study.  Echo Tee 08/05/21:  IMPRESSIONS     1. Left ventricular ejection fraction, by estimation, is 40  to 45%. The  left ventricle has mildly decreased function. The left ventricle  demonstrates global hypokinesis. The left ventricular internal cavity size  was mildly dilated.   2. Right ventricular systolic function is normal. The right ventricular  size is normal.   3. Left atrial size was moderate to severely dilated, visually.. No left  atrial/left atrial appendage thrombus was detected. The LAA emptying  velocity was 28 cm/s.   4. The mitral valve is normal in structure. Mild mitral valve  regurgitation. No evidence of mitral stenosis.   5. The aortic valve is tricuspid. Aortic valve regurgitation is not  visualized. Mild aortic valve sclerosis is present, with no evidence of  aortic valve stenosis.   6. Agitated saline contrast bubble study was negative, with no evidence  of any interatrial shunt.   Echo 05/23/21:  IMPRESSIONS     1. Left ventricular ejection fraction, by  estimation, is 30 to 35%. The  left ventricle has moderately decreased function. The left ventricle  demonstrates global hypokinesis. Left ventricular diastolic function could  not be evaluated. The average left  ventricular global longitudinal strain is -11.4 %. The global longitudinal  strain is abnormal.   2. Right ventricular systolic function is moderately reduced. The right  ventricular size is normal. Tricuspid regurgitation signal is inadequate  for assessing PA pressure.   3. The mitral valve is degenerative. Mild mitral valve regurgitation. No  evidence of mitral stenosis.   4. The aortic valve is grossly normal. Aortic valve regurgitation is not  visualized. Mild aortic valve sclerosis is present, with no evidence of  aortic valve stenosis.   5. The inferior vena cava is normal in size with greater than 50%  respiratory variability, suggesting right atrial pressure of 3 mmHg.   Comparison(s): No prior Echocardiogram.   Conclusion(s)/Recommendation(s): No left ventricular mural or apical  thrombus/thrombi.    EKG:  The ekg ordered today demonstrates NS with PACs.  Recent Labs: 08/28/2021: Magnesium 2.0; NT-Pro BNP 1,335 04/30/2022: BUN 19; Creatinine, Ser 1.24; Hemoglobin 14.1; Platelets 200; Potassium 4.5; Sodium 139  Recent Lipid Panel    Component Value Date/Time   CHOL 143 05/23/2021 1914   TRIG 106 05/23/2021 1914   HDL 49 05/23/2021 1914   CHOLHDL 2.9 05/23/2021 1914   VLDL 21 05/23/2021 1914   LDLCALC 73 05/23/2021 1914    Physical Exam:    VS:  BP 136/74   Pulse 70   Ht '5\' 2"'$  (1.575 m)   Wt 189 lb (85.7 kg)   SpO2 98%   BMI 34.57 kg/m     Wt Readings from Last 3 Encounters:  06/30/22 189 lb (85.7 kg)  05/19/22 193 lb 12.8 oz (87.9 kg)  05/12/22 190 lb 0.6 oz (86.2 kg)     GEN:  Well nourished, well developed in no acute distress HEENT: Normal NECK: No JVD; No carotid bruits LYMPHATICS: No lymphadenopathy CARDIAC: RRR, no murmurs, rubs,  gallops RESPIRATORY:  Clear to auscultation without rales, wheezing or rhonchi  ABDOMEN: Soft, non-tender, non-distended MUSCULOSKELETAL:  No edema; No deformity  SKIN: Warm and dry NEUROLOGIC:  Alert and oriented x 3 PSYCHIATRIC:  Normal affect        ASSESSMENT:    1. Persistent atrial fibrillation ([place])   2. Chronic HFrEF (heart failure with reduced ejection fraction) ([place])   3. Stroke determined by clinical assessment ([place])    PLAN:    In order of problems listed above:  #Persistent atrial fibrillation Maintaining sinus rhythm after  her ablation and cardioversion.  She is on Eliquis for stroke prophylaxis.  She has some PACs on her EKG today.  I will have her touch base with an APP in 6 months to make sure we had not had more atrial fibrillation.  I would plan for repeat ablation if she were to develop a recurrence of arrhythmia.  #Hematuria We will check a CBC today.  I have asked the patient to call her primary care physician when she leaves the office today to have this further evaluated.  #Chronic systolic heart failure NYHA class II.  Warm and dry on exam.  Continue Entresto, Toprol and spironolactone.   Medication Adjustments/Labs and Tests Ordered: Current medicines are reviewed at length with the patient today.  Concerns regarding medicines are outlined above.  No orders of the defined types were placed in this encounter.  No orders of the defined types were placed in this encounter.   I,Mary Mosetta Pigeon Buren,acting as a scribe for Vickie Epley, MD.,have documented all relevant documentation on the behalf of Vickie Epley, MD,as directed by  Vickie Epley, MD while in the presence of Vickie Epley, MD.   I, Vickie Epley, MD, have reviewed all documentation for this visit. The documentation on 06/30/22 for the exam, diagnosis, procedures, and orders are all accurate and complete.    Signed, Lars Mage, MD, [facility], [facility] 06/30/2022  2:37 PM    Electrophysiology [facility]

## 2022-07-01 DIAGNOSIS — N390 Urinary tract infection, site not specified: Secondary | ICD-10-CM | POA: Diagnosis not present

## 2022-07-01 DIAGNOSIS — R319 Hematuria, unspecified: Secondary | ICD-10-CM | POA: Diagnosis not present

## 2022-07-01 DIAGNOSIS — Z87442 Personal history of urinary calculi: Secondary | ICD-10-CM | POA: Diagnosis not present

## 2022-07-01 DIAGNOSIS — Z6834 Body mass index (BMI) 34.0-34.9, adult: Secondary | ICD-10-CM | POA: Diagnosis not present

## 2022-07-01 NOTE — Telephone Encounter (Signed)
Left message to call back  

## 2022-07-01 NOTE — Telephone Encounter (Signed)
The patient will go to [employer] today in [place].

## 2022-07-01 NOTE — Addendum Note (Signed)
Addended by: Drue Novel I on: 07/01/2022 10:50 AM   Modules accepted: Orders

## 2022-07-01 NOTE — Telephone Encounter (Signed)
Patient was returning call. Please advise ?

## 2022-07-02 LAB — CBC
Hematocrit: 40.8 % (ref 34.0–46.6)
Hemoglobin: 13.7 g/dL (ref 11.1–15.9)
MCH: 30.7 pg (ref 26.6–33.0)
MCHC: 33.6 g/dL (ref 31.5–35.7)
MCV: 92 fL (ref 79–97)
Platelets: 195 10*3/uL (ref 150–450)
RBC: 4.46 x10E6/uL (ref 3.77–5.28)
RDW: 12.1 % (ref 11.7–15.4)
WBC: 7.5 10*3/uL (ref 3.4–10.8)

## 2022-07-07 DIAGNOSIS — Z87442 Personal history of urinary calculi: Secondary | ICD-10-CM | POA: Diagnosis not present

## 2022-07-07 DIAGNOSIS — R188 Other ascites: Secondary | ICD-10-CM | POA: Diagnosis not present

## 2022-07-07 DIAGNOSIS — R310 Gross hematuria: Secondary | ICD-10-CM | POA: Diagnosis not present

## 2022-07-07 DIAGNOSIS — K429 Umbilical hernia without obstruction or gangrene: Secondary | ICD-10-CM | POA: Diagnosis not present

## 2022-07-07 DIAGNOSIS — Z853 Personal history of malignant neoplasm of breast: Secondary | ICD-10-CM | POA: Diagnosis not present

## 2022-07-07 DIAGNOSIS — K8689 Other specified diseases of pancreas: Secondary | ICD-10-CM | POA: Diagnosis not present

## 2022-07-07 DIAGNOSIS — I700 Atherosclerosis of aorta: Secondary | ICD-10-CM | POA: Diagnosis not present

## 2022-07-13 DIAGNOSIS — R319 Hematuria, unspecified: Secondary | ICD-10-CM | POA: Diagnosis not present

## 2022-07-14 DIAGNOSIS — K219 Gastro-esophageal reflux disease without esophagitis: Secondary | ICD-10-CM | POA: Diagnosis not present

## 2022-07-14 DIAGNOSIS — Z8673 Personal history of transient ischemic attack (TIA), and cerebral infarction without residual deficits: Secondary | ICD-10-CM | POA: Diagnosis not present

## 2022-07-14 DIAGNOSIS — N390 Urinary tract infection, site not specified: Secondary | ICD-10-CM | POA: Diagnosis not present

## 2022-07-14 DIAGNOSIS — E785 Hyperlipidemia, unspecified: Secondary | ICD-10-CM | POA: Diagnosis not present

## 2022-07-14 DIAGNOSIS — I10 Essential (primary) hypertension: Secondary | ICD-10-CM | POA: Diagnosis not present

## 2022-07-14 DIAGNOSIS — J309 Allergic rhinitis, unspecified: Secondary | ICD-10-CM | POA: Diagnosis not present

## 2022-07-14 DIAGNOSIS — N029 Recurrent and persistent hematuria with unspecified morphologic changes: Secondary | ICD-10-CM | POA: Diagnosis not present

## 2022-07-14 DIAGNOSIS — R059 Cough, unspecified: Secondary | ICD-10-CM | POA: Diagnosis not present

## 2022-07-14 DIAGNOSIS — E039 Hypothyroidism, unspecified: Secondary | ICD-10-CM | POA: Diagnosis not present

## 2022-07-20 ENCOUNTER — Other Ambulatory Visit: Payer: Self-pay | Admitting: Cardiology

## 2022-07-20 DIAGNOSIS — I5022 Chronic systolic (congestive) heart failure: Secondary | ICD-10-CM

## 2022-07-20 DIAGNOSIS — N200 Calculus of kidney: Secondary | ICD-10-CM | POA: Diagnosis not present

## 2022-07-20 DIAGNOSIS — R310 Gross hematuria: Secondary | ICD-10-CM | POA: Diagnosis not present

## 2022-07-21 DIAGNOSIS — E785 Hyperlipidemia, unspecified: Secondary | ICD-10-CM | POA: Diagnosis not present

## 2022-07-21 DIAGNOSIS — Z1331 Encounter for screening for depression: Secondary | ICD-10-CM | POA: Diagnosis not present

## 2022-07-21 DIAGNOSIS — Z9181 History of falling: Secondary | ICD-10-CM | POA: Diagnosis not present

## 2022-07-21 DIAGNOSIS — E669 Obesity, unspecified: Secondary | ICD-10-CM | POA: Diagnosis not present

## 2022-07-21 DIAGNOSIS — Z0000 Encounter for general adult medical examination without abnormal findings: Secondary | ICD-10-CM | POA: Diagnosis not present

## 2022-07-21 DIAGNOSIS — Z6834 Body mass index (BMI) 34.0-34.9, adult: Secondary | ICD-10-CM | POA: Diagnosis not present

## 2022-07-24 ENCOUNTER — Other Ambulatory Visit: Payer: Self-pay | Admitting: Cardiology

## 2022-07-24 DIAGNOSIS — I5022 Chronic systolic (congestive) heart failure: Secondary | ICD-10-CM

## 2022-08-02 ENCOUNTER — Ambulatory Visit: Payer: Medicare HMO

## 2022-08-02 VITALS — BP 122/75 | HR 77 | Temp 97.8°F | Resp 16 | Ht 62.0 in | Wt 187.2 lb

## 2022-08-02 DIAGNOSIS — I5022 Chronic systolic (congestive) heart failure: Secondary | ICD-10-CM

## 2022-08-02 DIAGNOSIS — Z951 Presence of aortocoronary bypass graft: Secondary | ICD-10-CM | POA: Diagnosis not present

## 2022-08-02 DIAGNOSIS — I2510 Atherosclerotic heart disease of native coronary artery without angina pectoris: Secondary | ICD-10-CM | POA: Diagnosis not present

## 2022-08-02 DIAGNOSIS — I10 Essential (primary) hypertension: Secondary | ICD-10-CM

## 2022-08-02 DIAGNOSIS — E785 Hyperlipidemia, unspecified: Secondary | ICD-10-CM | POA: Diagnosis not present

## 2022-08-02 DIAGNOSIS — I429 Cardiomyopathy, unspecified: Secondary | ICD-10-CM | POA: Diagnosis not present

## 2022-08-02 DIAGNOSIS — E782 Mixed hyperlipidemia: Secondary | ICD-10-CM

## 2022-08-02 DIAGNOSIS — I4819 Other persistent atrial fibrillation: Secondary | ICD-10-CM

## 2022-08-02 DIAGNOSIS — Z7901 Long term (current) use of anticoagulants: Secondary | ICD-10-CM | POA: Diagnosis not present

## 2022-08-02 MED ORDER — METOPROLOL SUCCINATE ER 25 MG PO TB24: 25.0000 mg | ORAL_TABLET | Freq: Every day | ORAL | 3 refills | Status: DC

## 2022-08-02 MED ORDER — ENTRESTO 97-103 MG PO TABS: 1.0000 | ORAL_TABLET | Freq: Two times a day (BID) | ORAL | 1 refills | Status: DC

## 2022-08-02 NOTE — Progress Notes (Signed)
Date:  08/02/2022   ID:  Leah Day, DOB Apr 05, 1946, MRN [id number]  PCP:  Nicholos Johns, MD   Date: 08/02/22 Last Office Visit: 09/15/2021  No chief complaint on file.   HPI  Leah Day is a 76 y.o. female with history of persistent atrial fibrillation status post TEE guided cardioversion, established coronary artery disease status post three-vessel CABG (2013), recovered cardiomyopathy, hypertension, hyperlipidemia, history of left-sided breast cancer status postmastectomy, former smoker, postmenopausal female, advanced age, obesity due to excess calories.  In July 2022 presented to the hospital with right-sided focal neurological deficits and received tPA with resolution of symptoms the following day.  Stroke work-up noted atrial fibrillation on surface ECG and echocardiogram noted moderately reduced LVEF.  Cardiology was consulted for further evaluation and management.  After being discharged patient was started on guideline directed medical therapy and underwent TEE guided cardioversion which restored normal sinus rhythm but by the time she presented for 2-week follow-up visit she was back in atrial fibrillation. She underwent afib ablation on 04/02/22 and unfortunately had recurrence of afib. She then had cardioversion on 05/12/22 and successfully returned to NSR.  She presents today for 6 month follow-up. She is still having some soft blood pressures (SBP 90-100). She is tolerated the Eliquis without bleeding diathesis. She did have some hematuria that has resolved. She is seeing a urologist because she does have significant history of UTI and kidney stones. She denies chest pain, shortness of breath, palpitations, dizziness, or syncope. She is walking 1 mile per day with dog.   ALLERGIES: Allergies  Allergen Reactions   Cephalexin Rash        Montelukast Sodium Swelling    Swelling of the throat, could no longer swallow pills   Atenolol Swelling   Irbesartan Cough    Lisinopril Cough    MEDICATION LIST PRIOR TO VISIT: Current Meds  Medication Sig   albuterol (VENTOLIN HFA) 108 (90 Base) MCG/ACT inhaler Inhale 2 puffs into the lungs daily as needed (cough).   apixaban (ELIQUIS) 5 MG TABS tablet Take 1 tablet (5 mg total) by mouth 2 (two) times daily.   aspirin 81 MG EC tablet Take 81 mg by mouth daily.   cholecalciferol (VITAMIN D3) 25 MCG (1000 UNIT) tablet Take 1,000 Units by mouth daily.   ENTRESTO 97-103 MG TAKE 1 TABLET BY MOUTH TWICE A DAY   esomeprazole (NEXIUM) 40 MG capsule Take 40 mg by mouth daily.   famotidine (PEPCID) 20 MG tablet Take 20 mg by mouth as needed for heartburn.   levothyroxine (SYNTHROID) 125 MCG tablet Take 125 mcg by mouth daily before breakfast.   loratadine (CLARITIN) 10 MG tablet Take 10 mg by mouth daily.   metoprolol succinate (TOPROL-XL) 50 MG 24 hr tablet TAKE 1 TABLET (50 MG TOTAL) BY MOUTH DAILY. HOLD IF SYSTOLIC BLOOD PRESSURE (TOP NUMBER) LESS THAN 100 MMHG OR PULSE LESS THAN 60 BPM.   Multiple Vitamins-Minerals (MULTIVITAMIN WITH MINERALS) tablet Take 1 tablet by mouth daily.   rosuvastatin (CRESTOR) 10 MG tablet Take 10 mg by mouth daily.   SYMBICORT 160-4.5 MCG/ACT inhaler Inhale 2 puffs into the lungs daily as needed (Cough).   vitamin B-12 (CYANOCOBALAMIN) 1000 MCG tablet Take 1,000 mcg by mouth daily.   vitamin C (ASCORBIC ACID) 500 MG tablet Take 500 mg by mouth daily.     PAST MEDICAL HISTORY: Coronary artery disease status post CABG 2013 at [facility]. Hypertension. Hyperlipidemia. Breast cancer, left-sided status postmastectomy Cardiomyopathy. Atrial fibrillation.  PAST  SURGICAL HISTORY: PCI to the coronary artery Three-vessel CABG, 2013 Bilateral carpal tunnel surgery. Tubal ligation. Appendectomy. Transesophageal guided cardioversion 07/28/2021  FAMILY HISTORY: No family history of premature CAD.  Father passed away at the age of 88 due to myocardial infarction.  And mother passed away  at the age of 56 due to Alzheimer's disease complications.  SOCIAL HISTORY:  The patient  reports that she has quit smoking. Her smoking use included cigarettes. She has a 45.00 pack-year smoking history. She has never used smokeless tobacco. She reports that she does not use drugs.  REVIEW OF SYSTEMS: Review of Systems  Constitutional:  Negative for malaise/fatigue.  Respiratory:  Negative for shortness of breath.   Cardiovascular:  Negative for chest pain, palpitations and claudication.  Gastrointestinal:  Negative for melena.  Genitourinary:  Negative for hematuria.  Neurological:  Negative for dizziness.    PHYSICAL EXAM:    08/02/2022    1:14 PM 06/30/2022    2:24 PM 05/19/2022    1:22 PM  Vitals with BMI  Height '5\' 2"'$  '5\' 2"'$  '5\' 2"'$   Weight 187 lbs 3 oz 189 lbs 193 lbs 13 oz  BMI 34.23 76.19 50.93  Systolic 267 124 580  Diastolic 75 74 70  Pulse 77 70 55   Physical Exam Constitutional:      Appearance: Normal appearance.  Cardiovascular:     Rate and Rhythm: Regular rhythm. Bradycardia present.     Pulses: Normal pulses.     Heart sounds: Normal heart sounds. No murmur heard. Pulmonary:     Effort: Pulmonary effort is normal.     Breath sounds: No wheezing or rales.  Abdominal:     General: Bowel sounds are normal.     Palpations: Abdomen is soft.  Musculoskeletal:     Right lower leg: No edema.     Left lower leg: No edema.  Neurological:     Mental Status: She is alert.    CARDIAC DATABASE: EKG: 08/02/22: Sinus Bradycardia at rate of 59 bpm. Normal axis.Low voltage with rightward P-axis. Compared to previous EKG sinus bradycardia has replaced afib.   Echocardiogram: 05/23/2021: LVEF 30-35%, global hypokinesis, average GLS -11.4%, moderately reduced right ventricular function, normal right ventricular size, mild MR, aortic valve sclerosis without stenosis, no LV thrombus.  09/30/2021: Low normal LV systolic function with visual EF 50-55%. Left ventricle cavity  is normal in size. Normal left ventricular wall thickness. Normal global wall motion. Abnormal septal wall motion due to post-operative septum. Unable to evaluate diastolic function due to atrial fibrillation.  Left atrial cavity is mildly dilated. No significant valvular heart disease. Compared to study 05/23/2021: LVEF improved from 30-35% to 50-55% otherwise no significant change.   Stress Testing: Lexiscan Tetrofosmin stress test 09/30/2021: 1 Day Rest/Stress Protocol. Stress EKG is non-diagnostic, as this is pharmacological stress test using Lexiscan. Normal myocardial perfusion without evidence of reversible myocardial ischemia or prior infarct. Left ventricular size and wall thickness preserved. Calculated LVEF 45% , may be reduced secondary to underlying A. fib. Consider transthoracic echo to reevaluate LVEF. No previous exam available for comparison. Low risk study.  Heart Catheterization: None  LABORATORY DATA:    Latest Ref Rng & Units 07/01/2022    2:25 PM 04/30/2022    2:30 PM 03/12/2022    9:28 AM  CBC  WBC 3.4 - 10.8 x10E3/uL 7.5  7.6  8.0   Hemoglobin 11.1 - 15.9 g/dL 13.7  14.1  14.7   Hematocrit 34.0 - 46.6 % 40.8  41.9  43.1   Platelets 150 - 450 x10E3/uL 195  200  231        Latest Ref Rng & Units 04/30/2022    2:30 PM 03/12/2022    9:28 AM 08/28/2021   11:37 AM  CMP  Glucose 70 - 99 mg/dL 103  101  107   BUN 8 - 23 mg/dL '19  19  19   '$ Creatinine 0.44 - 1.00 mg/dL 1.24  1.18  1.25   Sodium 135 - 145 mmol/L 139  138  141   Potassium 3.5 - 5.1 mmol/L 4.5  4.5  4.5   Chloride 98 - 111 mmol/L 109  102  106   CO2 22 - 32 mmol/L '22  22  21   '$ Calcium 8.9 - 10.3 mg/dL 9.2  9.4  9.1     Lipid Panel     Component Value Date/Time   CHOL 143 05/23/2021 1914   TRIG 106 05/23/2021 1914   HDL 49 05/23/2021 1914   CHOLHDL 2.9 05/23/2021 1914   VLDL 21 05/23/2021 1914   LDLCALC 73 05/23/2021 1914    No components found for: "NTPROBNP" Recent Labs     08/28/21 1136  PROBNP 1,335*   No results for input(s): "TSH" in the last 8760 hours.   BMP Recent Labs    08/28/21 1137 03/12/22 0928 04/30/22 1430  NA 141 138 139  K 4.5 4.5 4.5  CL 106 102 109  CO2 '21 22 22  '$ GLUCOSE 107* 101* 103*  BUN '19 19 19  '$ CREATININE 1.25* 1.18* 1.24*  CALCIUM 9.1 9.4 9.2  GFRNONAA  --   --  46*   HEMOGLOBIN A1C Lab Results  Component Value Date   HGBA1C 5.6 05/23/2021   MPG 114.02 05/23/2021      ICD-10-CM   1. Persistent atrial fibrillation (HCC)  I48.19 EKG 12-Lead    2. Atherosclerosis of native coronary artery of native heart without angina pectoris  I25.10     3. Cardiomyopathy, recovered  I42.9     4. Essential hypertension  I10     5. Mixed hyperlipidemia  E78.2      FINAL MEDICATION LIST END OF ENCOUNTER: No orders of the defined types were placed in this encounter.   There are no discontinued medications.   Current Outpatient Medications:    albuterol (VENTOLIN HFA) 108 (90 Base) MCG/ACT inhaler, Inhale 2 puffs into the lungs daily as needed (cough)., Disp: , Rfl:    apixaban (ELIQUIS) 5 MG TABS tablet, Take 1 tablet (5 mg total) by mouth 2 (two) times daily., Disp: 180 tablet, Rfl: 3   aspirin 81 MG EC tablet, Take 81 mg by mouth daily., Disp: , Rfl:    cholecalciferol (VITAMIN D3) 25 MCG (1000 UNIT) tablet, Take 1,000 Units by mouth daily., Disp: , Rfl:    ENTRESTO 97-103 MG, TAKE 1 TABLET BY MOUTH TWICE A DAY, Disp: 180 tablet, Rfl: 1   esomeprazole (NEXIUM) 40 MG capsule, Take 40 mg by mouth daily., Disp: , Rfl:    famotidine (PEPCID) 20 MG tablet, Take 20 mg by mouth as needed for heartburn., Disp: , Rfl:    levothyroxine (SYNTHROID) 125 MCG tablet, Take 125 mcg by mouth daily before breakfast., Disp: , Rfl:    loratadine (CLARITIN) 10 MG tablet, Take 10 mg by mouth daily., Disp: , Rfl:    metoprolol succinate (TOPROL-XL) 50 MG 24 hr tablet, TAKE 1 TABLET (50 MG TOTAL) BY MOUTH DAILY. HOLD IF SYSTOLIC BLOOD  PRESSURE  (TOP NUMBER) LESS THAN 100 MMHG OR PULSE LESS THAN 60 BPM., Disp: 90 tablet, Rfl: 0   Multiple Vitamins-Minerals (MULTIVITAMIN WITH MINERALS) tablet, Take 1 tablet by mouth daily., Disp: , Rfl:    rosuvastatin (CRESTOR) 10 MG tablet, Take 10 mg by mouth daily., Disp: , Rfl:    SYMBICORT 160-4.5 MCG/ACT inhaler, Inhale 2 puffs into the lungs daily as needed (Cough)., Disp: , Rfl:    vitamin B-12 (CYANOCOBALAMIN) 1000 MCG tablet, Take 1,000 mcg by mouth daily., Disp: , Rfl:    vitamin C (ASCORBIC ACID) 500 MG tablet, Take 500 mg by mouth daily., Disp: , Rfl:    spironolactone (ALDACTONE) 25 MG tablet, Take 0.5 tablets (12.5 mg total) by mouth every morning., Disp: 90 tablet, Rfl: 3  Orders Placed This Encounter  Procedures   EKG 12-Lead   There are no Patient Instructions on file for this visit.   RECOMMENDATIONS: Leah Day is a 76 y.o. female whose past medical history and cardiac risk factors include: Persistent atrial fibrillation status post ablation and TEE guided cardioversion, established coronary artery disease status post three-vessel CABG (2013), recovered cardiomyopathy, hypertension, hyperlipidemia, history of left-sided breast cancer status postmastectomy, former smoker, postmenopausal female, advanced age, obesity due to excess calories.  Persistent atrial fibrillation ([place]) S/p Afib ablation 04/02/22 with recurrence of afib and cardioversion on 05/12/22 with successful conversion to NSR. She is in sinus bradycardia with rate of 59 bpm today.  Given soft blood pressures, will decrease Metoprolol to '25mg'$  daily. Advised her to hold dose if SBP is >100.   Long term (current) use of anticoagulants Indication: Persistent atrial fibrillation. CHA2DS2-VASc SCORE is 6 which correlates to 9.8% risk of stroke per year. Does not endorse evidence of bleeding. No recent falls. Reemphasized the risks, benefits, and alternatives to oral anticoagulation.  Atherosclerosis of native coronary  artery of native heart without angina pectoris /history of CABG /recovered cardiomyopathy Free of angina pectoris. No recent use of sublingual nitroglycerin tablets. Echo 09/2021: LVEF has improved from 30-35% to 50-55% additional details reviewed with her during today's encounter and noted above for further reference MPI 09/2021: Low risk study, results reviewed with the patient during today's office visit. No heart failure symptoms. Continue cardiac medications as ordered.  Follow-up in 6 months or sooner if needed.    Ernst Spell, NP Pager: [phone] Office: [phone]

## 2022-08-16 ENCOUNTER — Other Ambulatory Visit: Payer: Self-pay | Admitting: Cardiology

## 2022-08-16 DIAGNOSIS — I2510 Atherosclerotic heart disease of native coronary artery without angina pectoris: Secondary | ICD-10-CM

## 2022-08-16 DIAGNOSIS — I429 Cardiomyopathy, unspecified: Secondary | ICD-10-CM

## 2022-08-16 DIAGNOSIS — Z951 Presence of aortocoronary bypass graft: Secondary | ICD-10-CM

## 2022-08-19 DIAGNOSIS — N2889 Other specified disorders of kidney and ureter: Secondary | ICD-10-CM | POA: Diagnosis not present

## 2022-08-19 DIAGNOSIS — R310 Gross hematuria: Secondary | ICD-10-CM | POA: Diagnosis not present

## 2022-09-07 DIAGNOSIS — N200 Calculus of kidney: Secondary | ICD-10-CM | POA: Diagnosis not present

## 2022-09-07 DIAGNOSIS — R310 Gross hematuria: Secondary | ICD-10-CM | POA: Diagnosis not present

## 2022-09-14 DIAGNOSIS — N184 Chronic kidney disease, stage 4 (severe): Secondary | ICD-10-CM | POA: Diagnosis not present

## 2022-09-14 DIAGNOSIS — J309 Allergic rhinitis, unspecified: Secondary | ICD-10-CM | POA: Diagnosis not present

## 2022-09-14 DIAGNOSIS — I10 Essential (primary) hypertension: Secondary | ICD-10-CM | POA: Diagnosis not present

## 2022-09-14 DIAGNOSIS — Z79899 Other long term (current) drug therapy: Secondary | ICD-10-CM | POA: Diagnosis not present

## 2022-09-14 DIAGNOSIS — E785 Hyperlipidemia, unspecified: Secondary | ICD-10-CM | POA: Diagnosis not present

## 2022-09-14 DIAGNOSIS — K219 Gastro-esophageal reflux disease without esophagitis: Secondary | ICD-10-CM | POA: Diagnosis not present

## 2022-09-14 DIAGNOSIS — Z23 Encounter for immunization: Secondary | ICD-10-CM | POA: Diagnosis not present

## 2022-09-14 DIAGNOSIS — Z8673 Personal history of transient ischemic attack (TIA), and cerebral infarction without residual deficits: Secondary | ICD-10-CM | POA: Diagnosis not present

## 2022-09-14 DIAGNOSIS — E559 Vitamin D deficiency, unspecified: Secondary | ICD-10-CM | POA: Diagnosis not present

## 2022-09-14 DIAGNOSIS — E039 Hypothyroidism, unspecified: Secondary | ICD-10-CM | POA: Diagnosis not present

## 2022-10-19 ENCOUNTER — Other Ambulatory Visit: Payer: Self-pay | Admitting: Cardiology

## 2022-10-19 DIAGNOSIS — I5022 Chronic systolic (congestive) heart failure: Secondary | ICD-10-CM

## 2022-11-17 DIAGNOSIS — M8580 Other specified disorders of bone density and structure, unspecified site: Secondary | ICD-10-CM | POA: Diagnosis not present

## 2022-11-17 DIAGNOSIS — Z1231 Encounter for screening mammogram for malignant neoplasm of breast: Secondary | ICD-10-CM | POA: Diagnosis not present

## 2022-11-17 DIAGNOSIS — N959 Unspecified menopausal and perimenopausal disorder: Secondary | ICD-10-CM | POA: Diagnosis not present

## 2022-11-17 DIAGNOSIS — M8589 Other specified disorders of bone density and structure, multiple sites: Secondary | ICD-10-CM | POA: Diagnosis not present

## 2022-11-24 DIAGNOSIS — Z8673 Personal history of transient ischemic attack (TIA), and cerebral infarction without residual deficits: Secondary | ICD-10-CM | POA: Diagnosis not present

## 2022-11-24 DIAGNOSIS — E039 Hypothyroidism, unspecified: Secondary | ICD-10-CM | POA: Diagnosis not present

## 2022-11-24 DIAGNOSIS — R059 Cough, unspecified: Secondary | ICD-10-CM | POA: Diagnosis not present

## 2022-11-24 DIAGNOSIS — I10 Essential (primary) hypertension: Secondary | ICD-10-CM | POA: Diagnosis not present

## 2022-11-24 DIAGNOSIS — E785 Hyperlipidemia, unspecified: Secondary | ICD-10-CM | POA: Diagnosis not present

## 2022-11-24 DIAGNOSIS — Z6834 Body mass index (BMI) 34.0-34.9, adult: Secondary | ICD-10-CM | POA: Diagnosis not present

## 2022-11-24 DIAGNOSIS — E559 Vitamin D deficiency, unspecified: Secondary | ICD-10-CM | POA: Diagnosis not present

## 2022-11-24 DIAGNOSIS — J309 Allergic rhinitis, unspecified: Secondary | ICD-10-CM | POA: Diagnosis not present

## 2022-11-24 DIAGNOSIS — K219 Gastro-esophageal reflux disease without esophagitis: Secondary | ICD-10-CM | POA: Diagnosis not present

## 2022-11-30 DIAGNOSIS — E785 Hyperlipidemia, unspecified: Secondary | ICD-10-CM | POA: Diagnosis not present

## 2022-11-30 DIAGNOSIS — Z79899 Other long term (current) drug therapy: Secondary | ICD-10-CM | POA: Diagnosis not present

## 2022-11-30 DIAGNOSIS — E559 Vitamin D deficiency, unspecified: Secondary | ICD-10-CM | POA: Diagnosis not present

## 2022-12-08 DIAGNOSIS — E785 Hyperlipidemia, unspecified: Secondary | ICD-10-CM | POA: Diagnosis not present

## 2022-12-08 DIAGNOSIS — K219 Gastro-esophageal reflux disease without esophagitis: Secondary | ICD-10-CM | POA: Diagnosis not present

## 2022-12-08 DIAGNOSIS — E039 Hypothyroidism, unspecified: Secondary | ICD-10-CM | POA: Diagnosis not present

## 2022-12-09 ENCOUNTER — Other Ambulatory Visit: Payer: Self-pay

## 2022-12-09 DIAGNOSIS — Z951 Presence of aortocoronary bypass graft: Secondary | ICD-10-CM

## 2022-12-09 DIAGNOSIS — I2510 Atherosclerotic heart disease of native coronary artery without angina pectoris: Secondary | ICD-10-CM

## 2022-12-09 DIAGNOSIS — I429 Cardiomyopathy, unspecified: Secondary | ICD-10-CM

## 2022-12-09 MED ORDER — DAPAGLIFLOZIN PROPANEDIOL 10 MG PO TABS: 10.0000 mg | ORAL_TABLET | Freq: Every day | ORAL | 1 refills | Status: DC

## 2022-12-16 ENCOUNTER — Encounter (HOSPITAL_COMMUNITY): Payer: Self-pay | Admitting: *Deleted

## 2022-12-23 NOTE — Progress Notes (Signed)
   PCP:  Nicholos Johns, MD Primary Cardiologist: None Electrophysiologist: Vickie Epley, MD   Leah Day is a 77 y.o. female seen today for Vickie Epley, MD for routine electrophysiology followup. Since last being seen in our clinic the patient reports doing very well.  she denies chest pain, dyspnea, PND, orthopnea, nausea, vomiting, dizziness, syncope, edema, weight gain, or early satiety.  She has rare, singular palpitations in the evening, likely consistent with her PACs. Nothing sustained  Past Medical History:  Diagnosis Date   Atrial fibrillation ([place])    Breast cancer ([place])    Cardiomyopathy ([place])    Coronary artery disease    Hyperlipidemia    Hypertension     Current Outpatient Medications  Medication Instructions   albuterol (VENTOLIN HFA) 108 (90 Base) MCG/ACT inhaler 2 puffs, Inhalation, Daily PRN   apixaban (ELIQUIS) 5 mg, Oral, 2 times daily   ascorbic acid (VITAMIN C) 500 mg, Oral, Daily   aspirin EC 81 mg, Oral, Daily   cholecalciferol (VITAMIN D3) 1,000 Units, Oral, Daily   cyanocobalamin (VITAMIN B12) 1,000 mcg, Oral, Daily   dapagliflozin propanediol (FARXIGA) 10 mg, Oral, Daily before breakfast   esomeprazole (NEXIUM) 40 mg, Oral, Daily   famotidine (PEPCID) 20 mg, Oral, As needed   levothyroxine (SYNTHROID) 125 mcg, Oral, Daily before breakfast   loratadine (CLARITIN) 10 mg, Oral, Daily   metoprolol succinate (TOPROL-XL) 25 mg, Oral, Daily, Take with or immediately following a meal.   Multiple Vitamins-Minerals (MULTIVITAMIN WITH MINERALS) tablet 1 tablet, Oral, Daily   rosuvastatin (CRESTOR) 10 mg, Oral, Daily   sacubitril-valsartan (ENTRESTO) 97-103 MG 1 tablet, Oral, 2 times daily   spironolactone (ALDACTONE) 12.5 mg, Oral, Every morning   SYMBICORT 160-4.5 MCG/ACT inhaler 2 puffs, Inhalation, Daily PRN    Physical Exam: Vitals:   12/27/22 1055  BP: (!) 144/80  Pulse: 64  SpO2: 99%  Weight: 187 lb (84.8 kg)  Height: 5' 2"$  (1.575 m)     GEN- NAD. A&O x 3. Normal affect. HEENT: normocephalic, atraumatic Lungs- CTAB, Normal effort Heart- Regular rate and rhythm, No M/G/R Extremities- No peripheral edema. no clubbing or cyanosis; Skin- warm and dry, no rash or lesion  EKG is ordered today. Personal review shows NSR at 64 bpm  with PACs  Additional studies reviewed include: Previous EP notes.   Assessment and Plan:  1. Persistent atrial fibrillation EKG today shows NSR Maintaining NSR after ablation and cardioversion Continue eliquis for stroke prophylaxis Per Dr. Quentin Ore, he would plan to repeat ablation if she were to develop a recurrence of arrhyhtmia. Labs today  2. HFrecEF Echo 09/2021 showed EF improved to 50-55%  Continue Entresto, Toprol, and spironolactone  3. Hematuria Isolated and not recurrent.  Follow  Follow up with Dr. Quentin Ore in 6 months  Shirley Friar, PA-C  12/27/22 11:08 AM

## 2022-12-27 ENCOUNTER — Encounter: Payer: Self-pay | Admitting: Student

## 2022-12-27 ENCOUNTER — Ambulatory Visit: Payer: Medicare HMO | Attending: Student | Admitting: Student

## 2022-12-27 VITALS — BP 144/80 | HR 64 | Ht 62.0 in | Wt 187.0 lb

## 2022-12-27 DIAGNOSIS — I5022 Chronic systolic (congestive) heart failure: Secondary | ICD-10-CM | POA: Diagnosis not present

## 2022-12-27 DIAGNOSIS — I4819 Other persistent atrial fibrillation: Secondary | ICD-10-CM

## 2022-12-27 LAB — BASIC METABOLIC PANEL
BUN/Creatinine Ratio: 18 (ref 12–28)
BUN: 20 mg/dL (ref 8–27)
CO2: 21 mmol/L (ref 20–29)
Calcium: 9.3 mg/dL (ref 8.7–10.3)
Chloride: 104 mmol/L (ref 96–106)
Creatinine, Ser: 1.1 mg/dL — ABNORMAL HIGH (ref 0.57–1.00)
Glucose: 105 mg/dL — ABNORMAL HIGH (ref 70–99)
Potassium: 4.3 mmol/L (ref 3.5–5.2)
Sodium: 139 mmol/L (ref 134–144)
eGFR: 52 mL/min/{1.73_m2} — ABNORMAL LOW (ref >59)

## 2022-12-27 LAB — CBC
Hematocrit: 39.2 % (ref 34.0–46.6)
Hemoglobin: 13.1 g/dL (ref 11.1–15.9)
MCH: 30.3 pg (ref 26.6–33.0)
MCHC: 33.4 g/dL (ref 31.5–35.7)
MCV: 91 fL (ref 79–97)
Platelets: 168 10*3/uL (ref 150–450)
RBC: 4.32 x10E6/uL (ref 3.77–5.28)
RDW: 12.5 % (ref 11.7–15.4)
WBC: 8.1 10*3/uL (ref 3.4–10.8)

## 2022-12-27 NOTE — Patient Instructions (Signed)
Medication Instructions:  Your physician recommends that you continue on your current medications as directed. Please refer to the Current Medication list given to you today.  *If you need a refill on your cardiac medications before your next appointment, please call your pharmacy*   Lab Work: TODAY: BMET, CBC  If you have labs (blood work) drawn today and your tests are completely normal, you will receive your results only by: [place] (if you have MyChart) OR A paper copy in the mail If you have any lab test that is abnormal or we need to change your treatment, we will call you to review the results.   Follow-Up: At [facility], you and your health needs are our priority.  As part of our continuing mission to provide you with exceptional heart care, we have created designated Provider Care Teams.  These Care Teams include your primary Cardiologist (physician) and Advanced Practice Providers (APPs -  Physician Assistants and Nurse Practitioners) who all work together to provide you with the care you need, when you need it.   Your next appointment:   6 month(s)  Provider:   Lars Mage, MD

## 2023-01-01 ENCOUNTER — Other Ambulatory Visit: Payer: Self-pay | Admitting: Cardiology

## 2023-01-01 DIAGNOSIS — Z7901 Long term (current) use of anticoagulants: Secondary | ICD-10-CM

## 2023-01-01 DIAGNOSIS — I4819 Other persistent atrial fibrillation: Secondary | ICD-10-CM

## 2023-01-11 ENCOUNTER — Ambulatory Visit: Payer: Medicare HMO | Admitting: Cardiology

## 2023-01-11 ENCOUNTER — Encounter: Payer: Self-pay | Admitting: Cardiology

## 2023-01-11 VITALS — BP 134/72 | HR 69 | Resp 16 | Ht 62.0 in | Wt 185.0 lb

## 2023-01-11 DIAGNOSIS — I480 Paroxysmal atrial fibrillation: Secondary | ICD-10-CM

## 2023-01-11 DIAGNOSIS — I2510 Atherosclerotic heart disease of native coronary artery without angina pectoris: Secondary | ICD-10-CM | POA: Diagnosis not present

## 2023-01-11 DIAGNOSIS — I6381 Other cerebral infarction due to occlusion or stenosis of small artery: Secondary | ICD-10-CM

## 2023-01-11 DIAGNOSIS — E782 Mixed hyperlipidemia: Secondary | ICD-10-CM

## 2023-01-11 DIAGNOSIS — I5032 Chronic diastolic (congestive) heart failure: Secondary | ICD-10-CM

## 2023-01-11 DIAGNOSIS — I10 Essential (primary) hypertension: Secondary | ICD-10-CM

## 2023-01-11 DIAGNOSIS — Z951 Presence of aortocoronary bypass graft: Secondary | ICD-10-CM

## 2023-01-11 DIAGNOSIS — Z87891 Personal history of nicotine dependence: Secondary | ICD-10-CM

## 2023-01-11 MED ORDER — SPIRONOLACTONE 25 MG PO TABS: 12.5000 mg | ORAL_TABLET | Freq: Every morning | ORAL | 3 refills | Status: DC

## 2023-01-11 MED ORDER — ENTRESTO 97-103 MG PO TABS: 1.0000 | ORAL_TABLET | Freq: Two times a day (BID) | ORAL | 1 refills | Status: DC

## 2023-01-11 NOTE — Progress Notes (Signed)
Date:  01/11/2023   ID:  Leah Day, DOB 09-Oct-1946, MRN [id number]  PCP:  Nicholos Johns, MD  Cardiologist:  Rex Kras, DO, [facility] (established care 05/23/2021)  Date: 01/11/23 Last Office Visit: 01/29/2022  Chief Complaint  Patient presents with   Atrial Fibrillation   Congestive Heart Failure   Follow-up    6 months    HPI  Leah Day is a 77 y.o. female whose past medical history and cardiovascular risk factors include: Paroxysmal atrial fibrillation status post TEE guided cardioversion & afib ablation, established coronary artery disease status post three-vessel CABG (2013), recovered cardiomyopathy, hypertension, hyperlipidemia, history of left-sided breast cancer status postmastectomy, former smoker, postmenopausal female, advanced age, obesity due to excess calories.  In July 2022 patient presented to ED with right-sided focal neurological deficits and received tPA and her symptoms resolved the following day.  Stroke workup noted atrial fibrillation on surface ECG and she was also noted to have reduced LVEF on echocardiogram.  She was started on treatment for atrial fibrillation after cleared by neurology and started on GDMT for her cardiomyopathy.  Despite medical therapy she remained in A-fib and underwent TEE guided cardioversion which restored normal sinus rhythm.  However, by the time she followed up 2 weeks later she was back in A-fib.  We had multiple discussions with regards to antiarrhythmic medications versus being considered for catheter directed ablation given her underlying cardiomyopathy.  She finally followed up with EP and underwent atrial fibrillation ablation.  After the ablation she went back into A-fib and therefore had another cardioversion with Dr. Johney Frame in July 2023 and now she presents today for follow-up.  Since last office visit patient is doing well from a cardiovascular standpoint.  Denies anginal discomfort or heart failure symptoms.  She  randomly has episodes of palpitations likely due to her intermittent PACs.  She is euvolemic on physical examination.  Requesting refills on medications.  ALLERGIES: Allergies  Allergen Reactions   Cephalexin Rash        Montelukast Sodium Swelling    Swelling of the throat, could no longer swallow pills   Atenolol Swelling   Irbesartan Cough   Lisinopril Cough    MEDICATION LIST PRIOR TO VISIT: No outpatient medications have been marked as taking for the 01/11/23 encounter (Office Visit) with Rex Kras, DO.     PAST MEDICAL HISTORY: Coronary artery disease status post CABG 2013 at [facility]. Hypertension. Hyperlipidemia. Breast cancer, left-sided status postmastectomy Cardiomyopathy. Atrial fibrillation.  PAST SURGICAL HISTORY: PCI to the coronary artery Three-vessel CABG, 2013 Bilateral carpal tunnel surgery. Tubal ligation. Appendectomy. Transesophageal guided cardioversion 07/28/2021 Atrial fibrillation ablation May 2023 Direct-current cardioversion in July 2023.   FAMILY HISTORY: No family history of premature CAD.  Father passed away at the age of 43 due to myocardial infarction.  And mother passed away at the age of 66 due to Alzheimer's disease complications.  SOCIAL HISTORY:  The patient  reports that she has quit smoking. Her smoking use included cigarettes. She has a 45.00 pack-year smoking history. She has never used smokeless tobacco. She reports that she does not use drugs.  REVIEW OF SYSTEMS: Review of Systems  Cardiovascular:  Positive for palpitations. Negative for chest pain, claudication, dyspnea on exertion, irregular heartbeat, leg swelling, near-syncope, orthopnea, paroxysmal nocturnal dyspnea and syncope.  Respiratory:  Negative for shortness of breath.   Hematologic/Lymphatic: Negative for bleeding problem.  Musculoskeletal:  Negative for muscle cramps and myalgias.  Neurological:  Negative for dizziness and light-headedness.  PHYSICAL EXAM:    01/11/2023    1:23 PM 12/27/2022   10:55 AM 08/02/2022    1:14 PM  Vitals with BMI  Height '5\' 2"'$  '5\' 2"'$  '5\' 2"'$   Weight 185 lbs 187 lbs 187 lbs 3 oz  BMI 33.83 [id number]  Systolic [id number]  Diastolic 72 80 75  Pulse 69 64 77    Physical Exam  Constitutional: No distress.  Age appropriate, hemodynamically stable.   Neck: No JVD present.  Cardiovascular: Normal rate, regular rhythm, S1 normal, S2 normal, intact distal pulses and normal pulses. Exam reveals no gallop, no S3 and no S4.  Murmur heard. Holosystolic murmur is present with a grade of 3/6 at the apex. Pulses:      Dorsalis pedis pulses are 2+ on the right side and 2+ on the left side.       Posterior tibial pulses are 2+ on the right side and 2+ on the left side.  Pulmonary/Chest: Effort normal and breath sounds normal. No stridor. She has no wheezes. She has no rales.  Abdominal: Soft. Bowel sounds are normal. She exhibits no distension. There is no abdominal tenderness.  Musculoskeletal:        General: No edema.     Cervical back: Neck supple.  Neurological: She is alert and oriented to person, place, and time. She has intact cranial nerves (2-12).  Skin: Skin is warm and moist.   CARDIAC DATABASE: EKG: 05/22/2021: Atrial fibrillation, controlled ventricular rate, 77 bpm, rare PVCs, without underlying ischemia or injury pattern.  08/05/2021: Normal sinus rhythm, 71 bpm, PACs, left axis deviation, without injury pattern.  09/15/2021: Atrial fibrillation, 65 bpm, PRWP, nonspecific T wave abnormality, without underlying injury pattern.  01/11/2023: Sinus rhythm, 67 bpm, frequent PACs, without underlying injury pattern.  Compared to prior EKG 08/02/2022 PVCs are new.   Echocardiogram: 05/23/2021: LVEF 30-35%, global hypokinesis, average GLS -11.4%, moderately reduced right ventricular function, normal right ventricular size, mild MR, aortic valve sclerosis without stenosis, no LV  thrombus.  09/30/2021: Low normal LV systolic function with visual EF 50-55%. Left ventricle cavity is normal in size. Normal left ventricular wall thickness. Normal global wall motion. Abnormal septal wall motion due to post-operative septum. Unable to evaluate diastolic function due to atrial fibrillation.  Left atrial cavity is mildly dilated. No significant valvular heart disease. Compared to study 05/23/2021: LVEF improved from 30-35% to 50-55% otherwise no significant change.   Stress Testing: Lexiscan Tetrofosmin stress test 09/30/2021: 1 Day Rest/Stress Protocol. Stress EKG is non-diagnostic, as this is pharmacological stress test using Lexiscan. Normal myocardial perfusion without evidence of reversible myocardial ischemia or prior infarct. Left ventricular size and wall thickness preserved. Calculated LVEF 45% , may be reduced secondary to underlying A. fib. Consider transthoracic echo to reevaluate LVEF. No previous exam available for comparison. Low risk study.  Heart Catheterization: None  LABORATORY DATA:    Latest Ref Rng & Units 12/27/2022   11:24 AM 07/01/2022    2:25 PM 04/30/2022    2:30 PM  CBC  WBC 3.4 - 10.8 x10E3/uL 8.1  7.5  7.6   Hemoglobin 11.1 - 15.9 g/dL 13.1  13.7  14.1   Hematocrit 34.0 - 46.6 % 39.2  40.8  41.9   Platelets 150 - 450 x10E3/uL 168  195  200        Latest Ref Rng & Units 12/27/2022   11:24 AM 04/30/2022    2:30 PM 03/12/2022    9:28 AM  CMP  Glucose 70 - 99 mg/dL 105  103  101   BUN 8 - 27 mg/dL '20  19  19   '$ Creatinine 0.57 - 1.00 mg/dL 1.10  1.24  1.18   Sodium 134 - 144 mmol/L 139  139  138   Potassium 3.5 - 5.2 mmol/L 4.3  4.5  4.5   Chloride 96 - 106 mmol/L 104  109  102   CO2 20 - 29 mmol/L '21  22  22   '$ Calcium 8.7 - 10.3 mg/dL 9.3  9.2  9.4     Lipid Panel     Component Value Date/Time   CHOL 143 05/23/2021 1914   TRIG 106 05/23/2021 1914   HDL 49 05/23/2021 1914   CHOLHDL 2.9 05/23/2021 1914   VLDL 21 05/23/2021  1914   LDLCALC 73 05/23/2021 1914    No components found for: "NTPROBNP" No results for input(s): "PROBNP" in the last 8760 hours.  No results for input(s): "TSH" in the last 8760 hours.   BMP Recent Labs    03/12/22 0928 04/30/22 1430 12/27/22 1124  NA 138 139 139  K 4.5 4.5 4.3  CL 102 109 104  CO2 '22 22 21  '$ GLUCOSE 101* 103* 105*  BUN '19 19 20  '$ CREATININE 1.18* 1.24* 1.10*  CALCIUM 9.4 9.2 9.3  GFRNONAA  --  46*  --     HEMOGLOBIN A1C Lab Results  Component Value Date   HGBA1C 5.6 05/23/2021   MPG 114.02 05/23/2021    IMPRESSION:    ICD-10-CM   1. Paroxysmal atrial fibrillation (HCC)  I48.0 EKG 12-Lead    Hemoglobin and hematocrit, blood    2. Chronic heart failure with preserved ejection fraction (HFpEF) (HCC)  I50.32 spironolactone (ALDACTONE) 25 MG tablet    sacubitril-valsartan (ENTRESTO) 97-103 MG    PCV ECHOCARDIOGRAM COMPLETE    3. Atherosclerosis of native coronary artery of native heart without angina pectoris  I25.10 PCV ECHOCARDIOGRAM COMPLETE    Lipid Panel With LDL/HDL Ratio    LDL cholesterol, direct    CMP14+EGFR    4. Hx of CABG  Z95.1 PCV ECHOCARDIOGRAM COMPLETE    Lipid Panel With LDL/HDL Ratio    LDL cholesterol, direct    CMP14+EGFR    5. Essential hypertension  I10     6. Mixed hyperlipidemia  E78.2     7. Lacunar stroke ([place])  I63.81     8. Former smoker  Z87.891        RECOMMENDATIONS: Kandice Eberly is a 77 y.o. female whose past medical history and cardiac risk factors include: Persistent atrial fibrillation status post TEE guided cardioversion, established coronary artery disease status post three-vessel CABG (2013), recovered cardiomyopathy, hypertension, hyperlipidemia, history of left-sided breast cancer status postmastectomy, former smoker, postmenopausal female, advanced age, obesity due to excess calories.  Paroxysmal atrial fibrillation (HCC) Rate control: Metoprolol. Rhythm control: N/A. Thromboembolic  prophylaxis: Eliquis. CHA2DS2-VASc SCORE is 6 which correlates to 9.8% risk of stroke per year. History of TEE guided cardioversion which was successful for short-term back to A-fib. Status post A-fib ablation 04/02/2022. Repeat cardioversion 05/12/2022 to normal sinus rhythm. EKG today illustrates sinus rhythm with PACs Follows with cardiac electrophysiologist Dr. Quentin Ore as well.  Chronic heart failure with preserved ejection fraction (HFpEF) ([place]) History of recovered cardiomyopathy. Currently euvolemic on physical examination. Medications reconciled. Refill spironolactone and Entresto. No changes warranted at this time.  Atherosclerosis of native coronary artery of native heart without angina pectoris Hx of CABG Denies anginal discomfort.  No use of sublingual nitroglycerin tablets since the last office visit. LVEF has improved from 30-35% to 50-55% as of November 2022. Would like to repeat an echo prior to the next office visit to reevaluate LVEF since she is now in sinus rhythm. EKG: Nonischemic. Last stress test 09/2021: Low risk study  Essential hypertension Office blood pressures within acceptable limits. Medication changes as noted above.  Mixed hyperlipidemia Currently on rosuvastatin.   She denies myalgia or other side effects. Recheck fasting lipid profile prior to next office visit.  FINAL MEDICATION LIST END OF ENCOUNTER: Meds ordered this encounter  Medications   spironolactone (ALDACTONE) 25 MG tablet    Sig: Take 0.5 tablets (12.5 mg total) by mouth every morning.    Dispense:  90 tablet    Refill:  3   sacubitril-valsartan (ENTRESTO) 97-103 MG    Sig: Take 1 tablet by mouth 2 (two) times daily.    Dispense:  180 tablet    Refill:  1    Medications Discontinued During This Encounter  Medication Reason   spironolactone (ALDACTONE) 25 MG tablet Reorder   sacubitril-valsartan (ENTRESTO) 97-103 MG Reorder     Current Outpatient Medications:    albuterol  (VENTOLIN HFA) 108 (90 Base) MCG/ACT inhaler, Inhale 2 puffs into the lungs daily as needed (cough)., Disp: , Rfl:    aspirin 81 MG EC tablet, Take 81 mg by mouth daily., Disp: , Rfl:    cholecalciferol (VITAMIN D3) 25 MCG (1000 UNIT) tablet, Take 1,000 Units by mouth daily., Disp: , Rfl:    dapagliflozin propanediol (FARXIGA) 10 MG TABS tablet, Take 1 tablet (10 mg total) by mouth daily before breakfast., Disp: 90 tablet, Rfl: 1   ELIQUIS 5 MG TABS tablet, TAKE 1 TABLET BY MOUTH TWICE A DAY, Disp: 180 tablet, Rfl: 3   esomeprazole (NEXIUM) 40 MG capsule, Take 40 mg by mouth daily., Disp: , Rfl:    famotidine (PEPCID) 20 MG tablet, Take 20 mg by mouth as needed for heartburn., Disp: , Rfl:    levothyroxine (SYNTHROID) 125 MCG tablet, Take 125 mcg by mouth daily before breakfast., Disp: , Rfl:    loratadine (CLARITIN) 10 MG tablet, Take 10 mg by mouth daily., Disp: , Rfl:    metoprolol succinate (TOPROL-XL) 25 MG 24 hr tablet, Take 1 tablet (25 mg total) by mouth daily. Take with or immediately following a meal., Disp: 90 tablet, Rfl: 3   Multiple Vitamins-Minerals (MULTIVITAMIN WITH MINERALS) tablet, Take 1 tablet by mouth daily., Disp: , Rfl:    rosuvastatin (CRESTOR) 10 MG tablet, Take 10 mg by mouth daily., Disp: , Rfl:    sacubitril-valsartan (ENTRESTO) 97-103 MG, Take 1 tablet by mouth 2 (two) times daily., Disp: 180 tablet, Rfl: 1   spironolactone (ALDACTONE) 25 MG tablet, Take 0.5 tablets (12.5 mg total) by mouth every morning., Disp: 90 tablet, Rfl: 3   SYMBICORT 160-4.5 MCG/ACT inhaler, Inhale 2 puffs into the lungs daily as needed (Cough)., Disp: , Rfl:    vitamin B-12 (CYANOCOBALAMIN) 1000 MCG tablet, Take 1,000 mcg by mouth daily., Disp: , Rfl:    vitamin C (ASCORBIC ACID) 500 MG tablet, Take 500 mg by mouth daily., Disp: , Rfl:   Orders Placed This Encounter  Procedures   Lipid Panel With LDL/HDL Ratio   LDL cholesterol, direct   CMP14+EGFR   Hemoglobin and hematocrit, blood    EKG 12-Lead   PCV ECHOCARDIOGRAM COMPLETE   There are no Patient Instructions on file for this visit.   --  Continue cardiac medications as reconciled in final medication list. --Return in about 7 months (around 08/05/2023) for Follow up, A. fib, CAD. Or sooner if needed. --Continue follow-up with your primary care physician regarding the management of your other chronic comorbid conditions.  Patient's questions and concerns were addressed to her satisfaction. She voices understanding of the instructions provided during this encounter.   This note was created using a voice recognition software as a result there may be grammatical errors inadvertently enclosed that do not reflect the nature of this encounter. Every attempt is made to correct such errors.  Rex Kras, [place], [facility]  Pager: [phone] Office: [phone]

## 2023-02-23 ENCOUNTER — Other Ambulatory Visit: Payer: Self-pay

## 2023-02-23 DIAGNOSIS — I700 Atherosclerosis of aorta: Secondary | ICD-10-CM | POA: Diagnosis not present

## 2023-02-23 DIAGNOSIS — I429 Cardiomyopathy, unspecified: Secondary | ICD-10-CM

## 2023-02-23 DIAGNOSIS — Z951 Presence of aortocoronary bypass graft: Secondary | ICD-10-CM

## 2023-02-23 DIAGNOSIS — I2510 Atherosclerotic heart disease of native coronary artery without angina pectoris: Secondary | ICD-10-CM

## 2023-02-23 DIAGNOSIS — E785 Hyperlipidemia, unspecified: Secondary | ICD-10-CM | POA: Diagnosis not present

## 2023-02-23 DIAGNOSIS — K219 Gastro-esophageal reflux disease without esophagitis: Secondary | ICD-10-CM | POA: Diagnosis not present

## 2023-02-23 DIAGNOSIS — L308 Other specified dermatitis: Secondary | ICD-10-CM | POA: Diagnosis not present

## 2023-02-23 DIAGNOSIS — I5022 Chronic systolic (congestive) heart failure: Secondary | ICD-10-CM

## 2023-02-23 DIAGNOSIS — Z79899 Other long term (current) drug therapy: Secondary | ICD-10-CM | POA: Diagnosis not present

## 2023-02-23 DIAGNOSIS — I10 Essential (primary) hypertension: Secondary | ICD-10-CM | POA: Diagnosis not present

## 2023-02-23 DIAGNOSIS — I4819 Other persistent atrial fibrillation: Secondary | ICD-10-CM

## 2023-02-23 DIAGNOSIS — Z6834 Body mass index (BMI) 34.0-34.9, adult: Secondary | ICD-10-CM | POA: Diagnosis not present

## 2023-02-23 MED ORDER — METOPROLOL SUCCINATE ER 25 MG PO TB24: 25.0000 mg | ORAL_TABLET | Freq: Every day | ORAL | 3 refills | Status: DC

## 2023-02-23 MED ORDER — DAPAGLIFLOZIN PROPANEDIOL 10 MG PO TABS: 10.0000 mg | ORAL_TABLET | Freq: Every day | ORAL | 3 refills | Status: DC

## 2023-03-25 ENCOUNTER — Other Ambulatory Visit: Payer: Self-pay | Admitting: Cardiology

## 2023-03-25 DIAGNOSIS — I5032 Chronic diastolic (congestive) heart failure: Secondary | ICD-10-CM

## 2023-04-21 ENCOUNTER — Other Ambulatory Visit: Payer: Self-pay

## 2023-04-21 DIAGNOSIS — I5032 Chronic diastolic (congestive) heart failure: Secondary | ICD-10-CM

## 2023-04-21 MED ORDER — ENTRESTO 97-103 MG PO TABS
1.0000 | ORAL_TABLET | Freq: Two times a day (BID) | ORAL | 1 refills | Status: DC
Start: 2023-04-21 — End: 2023-07-18

## 2023-05-23 DIAGNOSIS — Z8673 Personal history of transient ischemic attack (TIA), and cerebral infarction without residual deficits: Secondary | ICD-10-CM | POA: Diagnosis not present

## 2023-05-23 DIAGNOSIS — K219 Gastro-esophageal reflux disease without esophagitis: Secondary | ICD-10-CM | POA: Diagnosis not present

## 2023-05-23 DIAGNOSIS — E785 Hyperlipidemia, unspecified: Secondary | ICD-10-CM | POA: Diagnosis not present

## 2023-05-23 DIAGNOSIS — R059 Cough, unspecified: Secondary | ICD-10-CM | POA: Diagnosis not present

## 2023-05-23 DIAGNOSIS — I10 Essential (primary) hypertension: Secondary | ICD-10-CM | POA: Diagnosis not present

## 2023-06-01 DIAGNOSIS — E559 Vitamin D deficiency, unspecified: Secondary | ICD-10-CM | POA: Diagnosis not present

## 2023-06-01 DIAGNOSIS — Z79899 Other long term (current) drug therapy: Secondary | ICD-10-CM | POA: Diagnosis not present

## 2023-06-01 DIAGNOSIS — E785 Hyperlipidemia, unspecified: Secondary | ICD-10-CM | POA: Diagnosis not present

## 2023-06-09 ENCOUNTER — Encounter: Payer: Self-pay | Admitting: Cardiology

## 2023-06-09 ENCOUNTER — Ambulatory Visit: Payer: Medicare HMO | Attending: Cardiology | Admitting: Cardiology

## 2023-06-09 VITALS — BP 132/70 | HR 53 | Ht 62.0 in | Wt 186.8 lb

## 2023-06-09 DIAGNOSIS — I4819 Other persistent atrial fibrillation: Secondary | ICD-10-CM

## 2023-06-09 DIAGNOSIS — I5032 Chronic diastolic (congestive) heart failure: Secondary | ICD-10-CM | POA: Diagnosis not present

## 2023-06-09 NOTE — Progress Notes (Signed)
  Electrophysiology Office Follow up Visit Note:    Date:  06/09/2023   ID:  Leah Day, DOB 1946-03-18, MRN [id number]  PCP:  Lucianne Lei, MD  [facility] Cardiologist:  None  [facility] Electrophysiologist:  Lanier Prude, MD    Interval History:    Leah Day is a 77 y.o. female who presents for a follow up visit.   Last seen December 27, 2022 by Mardelle Matte.  She has a history of atrial fibrillation, hypertension, coronary artery disease, hyperlipidemia.  She is maintained on Eliquis for stroke prophylaxis.  She has a history of prior A-fib ablation on Apr 02, 2022 during which the veins and posterior wall were isolated. She is doing very well today.  No episodes of atrial fibrillation.  She is tolerating her medications without any issues.  She takes her blood thinner twice daily.      Past medical, surgical, social and family history were reviewed.  ROS:   Please see the history of present illness.    All other systems reviewed and are negative.  EKGs/Labs/Other Studies Reviewed:    The following studies were reviewed today:  January 11, 2023 EKG reviewed and shows sinus rhythm with PACs  EKG Interpretation Date/Time:  Thursday June 09 2023 11:37:11 EDT Ventricular Rate:  53 PR Interval:  184 QRS Duration:  70 QT Interval:  416 QTC Calculation: 390 R Axis:   -11  Text Interpretation: Sinus bradycardia Low voltage QRS Confirmed by Steffanie Dunn [phone]) on 06/09/2023 11:40:31 AM    Physical Exam:    VS:  BP 132/70   Pulse (!) 53   Ht 5\' 2"  (1.575 m)   Wt 186 lb 12.8 oz (84.7 kg)   SpO2 98%   BMI 34.17 kg/m     Wt Readings from Last 3 Encounters:  06/09/23 186 lb 12.8 oz (84.7 kg)  01/11/23 185 lb (83.9 kg)  12/27/22 187 lb (84.8 kg)     GEN:  Well nourished, well developed in no acute distress CARDIAC: RRR, no murmurs, rubs, gallops RESPIRATORY:  Clear to auscultation without rales, wheezing or rhonchi       ASSESSMENT:    1.  Persistent atrial fibrillation (HCC)   2. Chronic heart failure with preserved ejection fraction (HFpEF) (HCC)    PLAN:    In order of problems listed above:  #Persistent atrial fibrillation Doing well after catheter ablation in 2023.  Rhythm control indicated given heart failure diagnosis.  Continue Eliquis for stroke prophylaxis  #Chronic diastolic heart failure NYHA class II.  Rhythm control indicated.  Continue current medications including spironolactone, Entresto, metoprolol, Farxiga   Follow-up with EP APP in 1 year.    Signed, Steffanie Dunn, MD, [facility], [facility] 06/09/2023 11:43 AM    Electrophysiology [facility]

## 2023-06-09 NOTE — Patient Instructions (Signed)
Medication Instructions:  Your physician recommends that you continue on your current medications as directed. Please refer to the Current Medication list given to you today.  *If you need a refill on your cardiac medications before your next appointment, please call your pharmacy*  Follow-Up: At [facility], you and your health needs are our priority.  As part of our continuing mission to provide you with exceptional heart care, we have created designated Provider Care Teams.  These Care Teams include your primary Cardiologist (physician) and Advanced Practice Providers (APPs -  Physician Assistants and Nurse Practitioners) who all work together to provide you with the care you need, when you need it.  Your next appointment:   1 year(s)  Provider:   You will see one of the following Advanced Practice Providers on your designated Care Team:   Francis Dowse, Charlott Holler [address]" [place], [place] Sherie Don, NP Canary Brim, NP

## 2023-06-20 ENCOUNTER — Telehealth: Payer: Self-pay

## 2023-06-20 ENCOUNTER — Encounter: Payer: Self-pay | Admitting: Cardiology

## 2023-07-08 IMAGING — CT CT HEART MORPH/PULM VEIN W/ CM & W/O CA SCORE
2 of 6 series · 11 of 20 positions shown, 13 images · IV contrast (APPLIED)
Comparison: None.

Addendum:
CLINICAL DATA: Atrial fibrillation scheduled for an ablation.

EXAM:
Cardiac CT/CTA
TECHNIQUE: The patient was scanned on a Siemens Somatom scanner.

[Series 8: 0-90% · axial · 0.72mm/px · z∈[+1028,+1139]mm · 6 of 3100 slices shown, 8 images]
[im 443/3100  vessel]
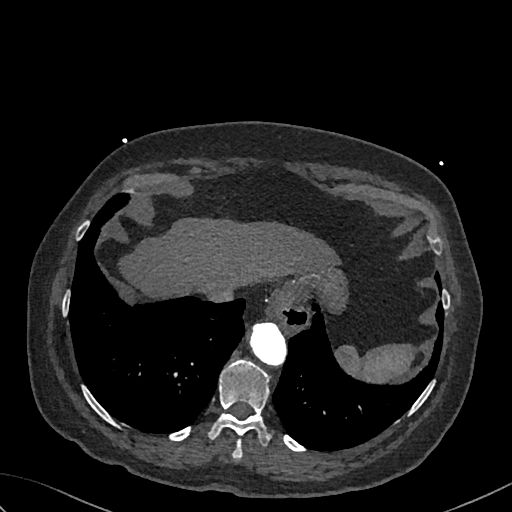
[im 443/3100  lung]
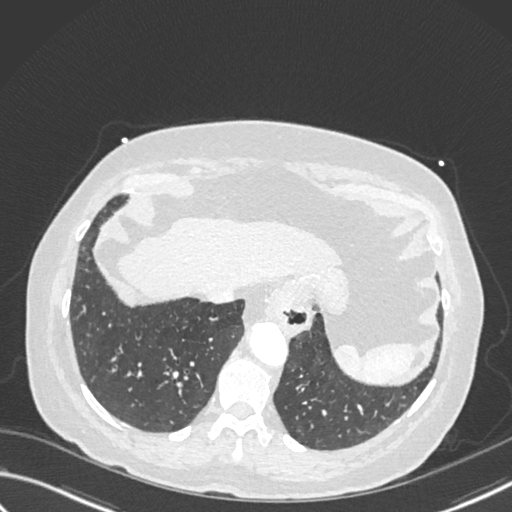
[im 886/3100  vessel]
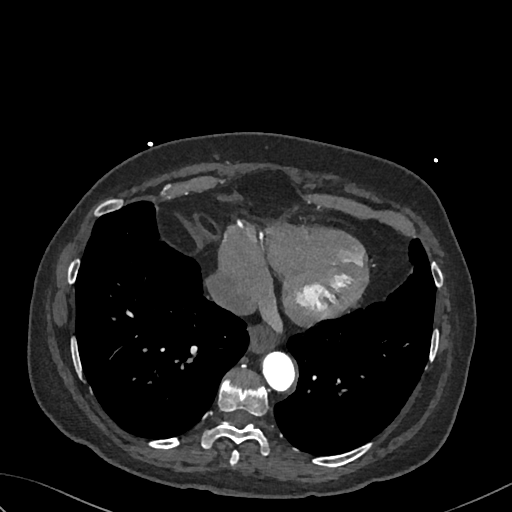
[im 1329/3100  vessel]
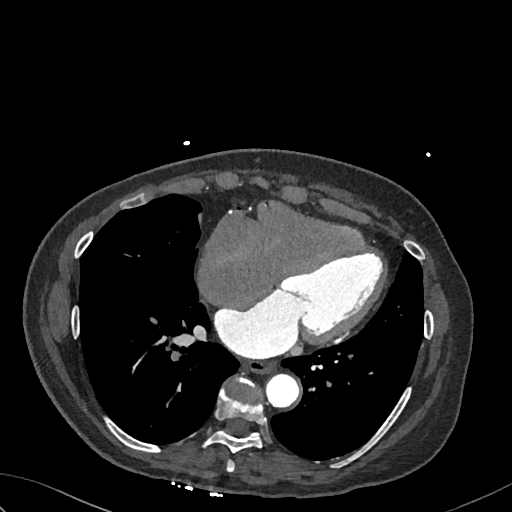
[im 1771/3100  vessel]
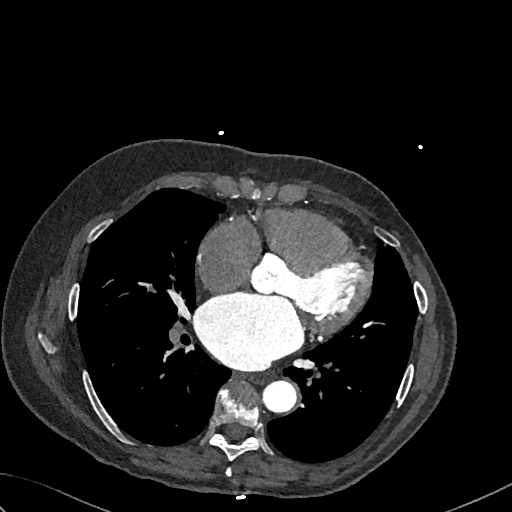
[im 2214/3100  vessel]
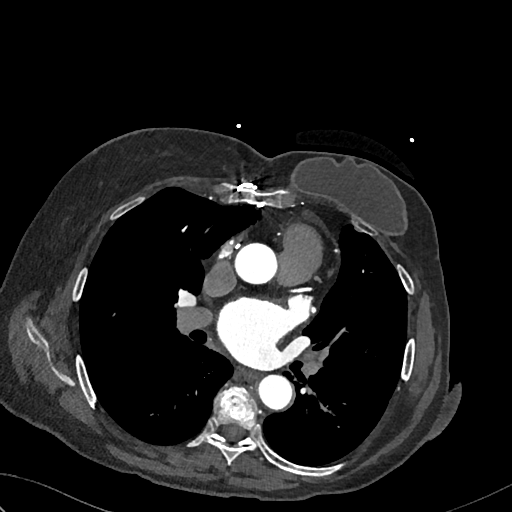
[im 2214/3100  lung]
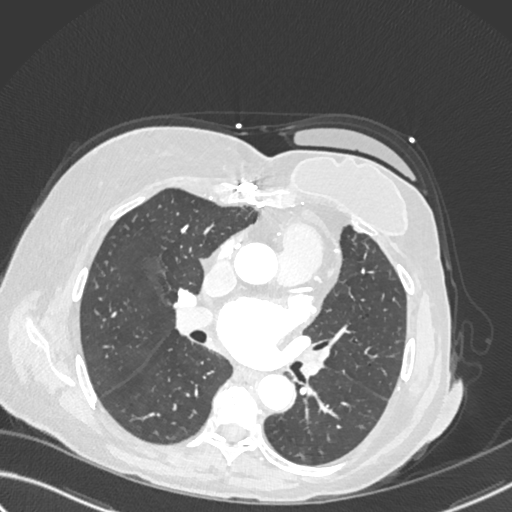
[im 2657/3100  vessel]
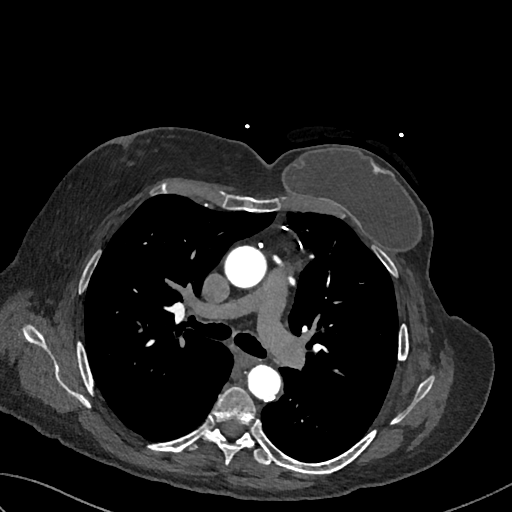

[Series 13: 5-95% · axial · 0.72mm/px · z∈[+1028,+1117]mm · 5 of 3100 slices shown]
[im 443/3100  vessel]
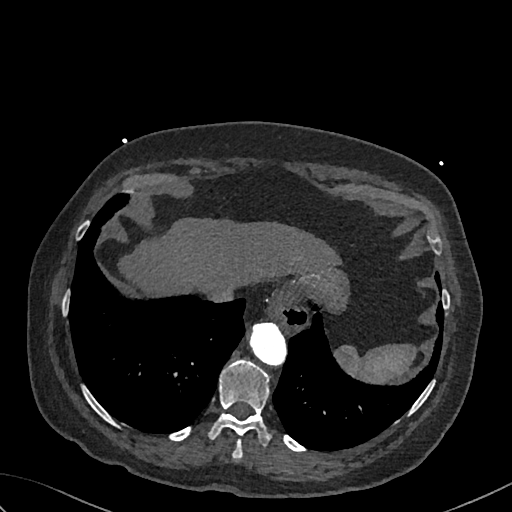
[im 886/3100  vessel]
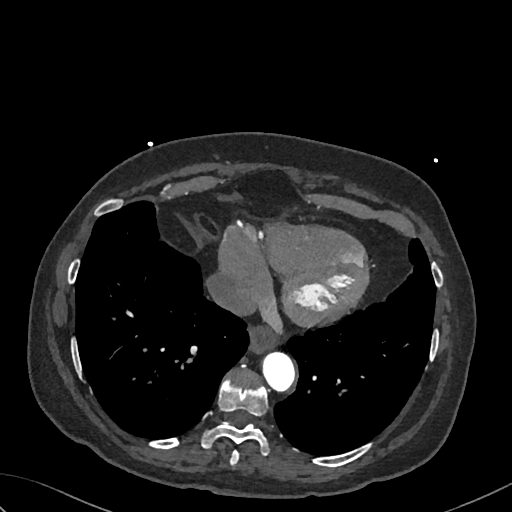
[im 1329/3100  vessel]
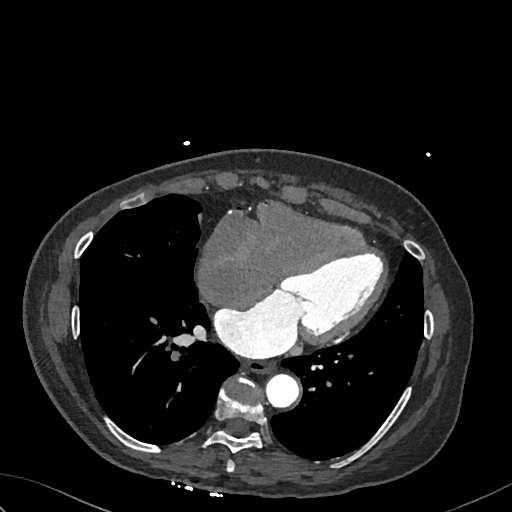
[im 1771/3100  vessel]
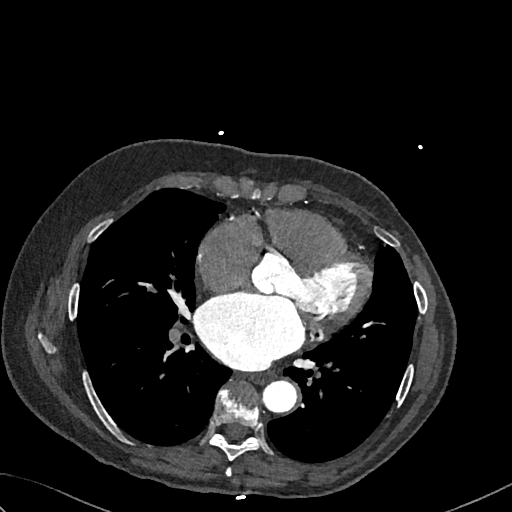
[im 2214/3100  vessel]
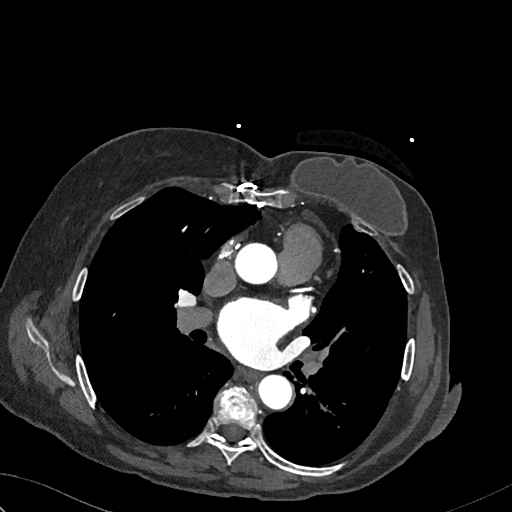

[11 of 20 positions shown; findings below may reference images not displayed]

FINDINGS: A 120 kV prospective scan was triggered in the descending thoracic
aorta at 111 HU's. Gantry rotation speed was 280 msecs and
collimation was .9 mm. No beta blockade and no NTG was given. The 3D
data set was reconstructed in 5% intervals of the 60-80 % of the R-R
cycle. Diastolic phases were analyzed on a dedicated work station
using MPR, MIP and VRT modes. The patient received 95 cc of
contrast.

There is normal pulmonary vein drainage into the left atrium (2 on
the right and 2 on the left) with ostial measurements as follows:

RUPV: 15 mm X 13 mm

RLPV: 21 mm X 17 mm

LUPV: 33 mm X 15 mm

LLPV: 27 mm X 16 mm

Left Atrial appendage assessment

Landing Zone Measurements:

Left atrial appendage ostial size is 20 mm X 14 mm

Maximal length from the landing zone of main lobe is 28 mm.

Chicken wing morphology.

No thrombus.

The esophagus runs in the left atrial midline and is closest in the
proximity to the left lower pulmonary vein.

Aorta:  Normal caliber.  No dissection or calcifications.

Main Pulmonary Artery: Normal caliber.

Aortic Valve:  Tri-leaflet.  No calcifications.

Coronary Arteries: Normal coronary origin. Prior CABG. Calcium
scoring deferred.

Coronary artery dilation 18 mm.

Extra-cardiac findings: See attached radiology report for
non-cardiac structures.
IMPRESSION: 1. There is normal pulmonary vein drainage into the left atrium.

2. The left atrial appendage is large - ostial size 20 mm X 14 mm
and length 28 mm. There is no thrombus in the left atrial appendage.

3. The esophagus runs in the left atrial midline and is closest in
the proximity to the left lower pulmonary vein.

RECOMMENDATIONS:



If CAC = 0, it is reasonable to withhold statin therapy and reassess
in 5 to 10 years, as long as higher risk conditions are absent
(diabetes mellitus, family history of premature CHD in first degree
relatives (males <55 years; females <65 years), cigarette smoking,
LDL >=190 mg/dL or other independent risk factors).

If CAC is 1 to 99, it is reasonable to initiate statin therapy for
patients >=55 years of age.

If CAC is >=100 or >=75th percentile, it is reasonable to initiate
statin therapy at any age.

Cardiology referral should be considered for patients with CAC
scores =400 or >=75th percentile.

*1704 AHA/ACC/AACVPR/AAPA/ABC/ARVEL/ISSAC/FOREST/Moatshe/RAMIAR/AATTO/SACK
Guideline on the Management of Blood Cholesterol: A Report of the
American College of Cardiology/American Heart Association Task Force
on Clinical Practice Guidelines. J Am Coll Cardiol.
4536;73(24):5210-5209.

EXAM:
OVER-READ INTERPRETATION  CT CHEST

The following report is an over-read performed by radiologist Dr.
over-read does not include interpretation of cardiac or coronary
anatomy or pathology. The coronary CTA interpretation by the
cardiologist is attached.
FINDINGS: Extracardiac vascular: Thoracic aortic atherosclerosis.

Mediastinum: Small distal paraesophageal hiatal hernia. Distal
esophageal wall thickening, esophagitis would be a common cause.

Lungs: 3 by 4 mm subpleural nodule in the right lower lobe adjacent
to the major fissure on image 33 series 10. Small ground-glass
density in the superior segment left lower lobe conforming to
secondary pulmonary lobular morphology.

Upper abdomen: Unremarkable

Musculoskeletal: Left breast implant.
IMPRESSION: 1. Small distal paraesophageal hiatal hernia. Distal esophageal wall
thickening, esophagitis would be a common cause.
2. 4 mm subpleural nodule in the right lower lobe.
No routine follow-up imaging is recommended per [HOSPITAL]
Guidelines.
These guidelines do not apply to immunocompromised patients and
patients with cancer. Follow up in patients with significant
comorbidities as clinically warranted. For lung cancer screening,
adhere to Lung-RADS guidelines. Reference: Radiology. 5924;
284(1):228-43.
3.  Aortic Atherosclerosis ([id number]).

*** End of Addendum ***
FINDINGS: A 120 kV prospective scan was triggered in the descending thoracic
aorta at 111 HU's. Gantry rotation speed was 280 msecs and
collimation was .9 mm. No beta blockade and no NTG was given. The 3D
data set was reconstructed in 5% intervals of the 60-80 % of the R-R
cycle. Diastolic phases were analyzed on a dedicated work station
using MPR, MIP and VRT modes. The patient received 95 cc of
contrast.

There is normal pulmonary vein drainage into the left atrium (2 on
the right and 2 on the left) with ostial measurements as follows:

RUPV: 15 mm X 13 mm

RLPV: 21 mm X 17 mm

LUPV: 33 mm X 15 mm

LLPV: 27 mm X 16 mm

Left Atrial appendage assessment

Landing Zone Measurements:

Left atrial appendage ostial size is 20 mm X 14 mm

Maximal length from the landing zone of main lobe is 28 mm.

Chicken wing morphology.

No thrombus.

The esophagus runs in the left atrial midline and is closest in the
proximity to the left lower pulmonary vein.

Aorta:  Normal caliber.  No dissection or calcifications.

Main Pulmonary Artery: Normal caliber.

Aortic Valve:  Tri-leaflet.  No calcifications.

Coronary Arteries: Normal coronary origin. Prior CABG. Calcium
scoring deferred.

Coronary artery dilation 18 mm.

Extra-cardiac findings: See attached radiology report for
non-cardiac structures.
IMPRESSION: 1. There is normal pulmonary vein drainage into the left atrium.

2. The left atrial appendage is large - ostial size 20 mm X 14 mm
and length 28 mm. There is no thrombus in the left atrial appendage.

3. The esophagus runs in the left atrial midline and is closest in
the proximity to the left lower pulmonary vein.

RECOMMENDATIONS:



If CAC = 0, it is reasonable to withhold statin therapy and reassess
in 5 to 10 years, as long as higher risk conditions are absent
(diabetes mellitus, family history of premature CHD in first degree
relatives (males <55 years; females <65 years), cigarette smoking,
LDL >=190 mg/dL or other independent risk factors).

If CAC is 1 to 99, it is reasonable to initiate statin therapy for
patients >=55 years of age.

If CAC is >=100 or >=75th percentile, it is reasonable to initiate
statin therapy at any age.

Cardiology referral should be considered for patients with CAC
scores =400 or >=75th percentile.

*1704 AHA/ACC/AACVPR/AAPA/ABC/ARVEL/ISSAC/FOREST/Moatshe/RAMIAR/AATTO/SACK
Guideline on the Management of Blood Cholesterol: A Report of the
American College of Cardiology/American Heart Association Task Force
on Clinical Practice Guidelines. J Am Coll Cardiol.
4536;73(24):5210-5209.

## 2023-07-13 ENCOUNTER — Other Ambulatory Visit: Payer: Self-pay | Admitting: Cardiology

## 2023-07-13 DIAGNOSIS — I2510 Atherosclerotic heart disease of native coronary artery without angina pectoris: Secondary | ICD-10-CM | POA: Diagnosis not present

## 2023-07-13 DIAGNOSIS — Z951 Presence of aortocoronary bypass graft: Secondary | ICD-10-CM | POA: Diagnosis not present

## 2023-07-13 NOTE — Telephone Encounter (Signed)
Called and spoke to patient regarding dental clearance patient stated she had the extraction already and everything went well

## 2023-07-14 LAB — CMP14+EGFR
ALT: 13 IU/L (ref 0–32)
AST: 15 IU/L (ref 0–40)
Albumin: 4.5 g/dL (ref 3.8–4.8)
Alkaline Phosphatase: 86 IU/L (ref 44–121)
BUN/Creatinine Ratio: 20 (ref 12–28)
BUN: 23 mg/dL (ref 8–27)
Bilirubin Total: 0.5 mg/dL (ref 0.0–1.2)
CO2: 20 mmol/L (ref 20–29)
Calcium: 9.2 mg/dL (ref 8.7–10.3)
Chloride: 107 mmol/L — ABNORMAL HIGH (ref 96–106)
Creatinine, Ser: 1.13 mg/dL — ABNORMAL HIGH (ref 0.57–1.00)
Globulin, Total: 2.3 g/dL (ref 1.5–4.5)
Glucose: 97 mg/dL (ref 70–99)
Potassium: 4.1 mmol/L (ref 3.5–5.2)
Sodium: 141 mmol/L (ref 134–144)
Total Protein: 6.8 g/dL (ref 6.0–8.5)
eGFR: 51 mL/min/{1.73_m2} — ABNORMAL LOW (ref >59)

## 2023-07-14 LAB — LIPID PANEL WITH LDL/HDL RATIO
Cholesterol, Total: 154 mg/dL (ref 100–199)
HDL: 49 mg/dL (ref >39)
LDL Chol Calc (NIH): 82 mg/dL (ref 0–99)
LDL/HDL Ratio: 1.7 ratio (ref 0.0–3.2)
Triglycerides: 128 mg/dL (ref 0–149)
VLDL Cholesterol Cal: 23 mg/dL (ref 5–40)

## 2023-07-14 LAB — HEMOGLOBIN AND HEMATOCRIT, BLOOD
Hematocrit: 39.8 % (ref 34.0–46.6)
Hemoglobin: 13.1 g/dL (ref 11.1–15.9)

## 2023-07-14 LAB — LDL CHOLESTEROL, DIRECT: LDL Direct: 84 mg/dL (ref 0–99)

## 2023-07-18 ENCOUNTER — Other Ambulatory Visit: Payer: Self-pay

## 2023-07-18 ENCOUNTER — Other Ambulatory Visit: Payer: Medicare HMO

## 2023-07-18 DIAGNOSIS — Z7901 Long term (current) use of anticoagulants: Secondary | ICD-10-CM

## 2023-07-18 DIAGNOSIS — I4819 Other persistent atrial fibrillation: Secondary | ICD-10-CM

## 2023-07-18 DIAGNOSIS — I5032 Chronic diastolic (congestive) heart failure: Secondary | ICD-10-CM

## 2023-07-18 MED ORDER — APIXABAN 5 MG PO TABS
5.0000 mg | ORAL_TABLET | Freq: Two times a day (BID) | ORAL | 3 refills | Status: DC
Start: 2023-07-18 — End: 2024-06-19

## 2023-07-18 MED ORDER — ENTRESTO 97-103 MG PO TABS
1.0000 | ORAL_TABLET | Freq: Two times a day (BID) | ORAL | 1 refills | Status: DC
Start: 2023-07-18 — End: 2023-12-30

## 2023-07-27 DIAGNOSIS — Z0000 Encounter for general adult medical examination without abnormal findings: Secondary | ICD-10-CM | POA: Diagnosis not present

## 2023-07-27 DIAGNOSIS — Z9181 History of falling: Secondary | ICD-10-CM | POA: Diagnosis not present

## 2023-08-04 ENCOUNTER — Ambulatory Visit: Payer: Medicare HMO | Admitting: Cardiology

## 2023-08-16 DIAGNOSIS — K219 Gastro-esophageal reflux disease without esophagitis: Secondary | ICD-10-CM | POA: Diagnosis not present

## 2023-08-16 DIAGNOSIS — Z8673 Personal history of transient ischemic attack (TIA), and cerebral infarction without residual deficits: Secondary | ICD-10-CM | POA: Diagnosis not present

## 2023-08-16 DIAGNOSIS — J309 Allergic rhinitis, unspecified: Secondary | ICD-10-CM | POA: Diagnosis not present

## 2023-08-16 DIAGNOSIS — Z6835 Body mass index (BMI) 35.0-35.9, adult: Secondary | ICD-10-CM | POA: Diagnosis not present

## 2023-08-16 DIAGNOSIS — E785 Hyperlipidemia, unspecified: Secondary | ICD-10-CM | POA: Diagnosis not present

## 2023-08-16 DIAGNOSIS — I10 Essential (primary) hypertension: Secondary | ICD-10-CM | POA: Diagnosis not present

## 2023-08-18 NOTE — Progress Notes (Signed)
Cardiology Office Note:  .   Date:  08/19/2023  ID:  Leah Day, DOB Nov 01, 1946, MRN [id number] PCP: Lucianne Lei, MD  [facility] Providers Cardiologist:  None Electrophysiologist:  Lanier Prude, MD {  History of Present Illness: .   Leah Day is a 77 y.o. female With a past medical history of persistent atrial fibrillation and chronic HFpEF.  She was last seen by EP 06/09/2023.  She was doing well from her catheter ablation in 2023.  Rhythm was well-controlled.  She maintained on Eliquis for stroke prophylaxis.  She was seen by Dr. Odis Hollingshead back in March of this year.  History includes ED visits July 2022 with right-sided focal neurologic defects and received tPA with symptoms resolving that day.  Stroke workup noted atrial fibrillation on surface EKG that was noted also to have a reduced LVEF on echo.  Started on treatment for A-fib after cleared by neurology and started on GDMT for her cardiomyopathy.  Despite medical treatment she remained in A-fib and therefore had another cardioversion with Dr. Shari Prows July 2023.  At the time of her last visit she was doing well from a cardiovascular standpoint.  Denied anginal and heart failure symptoms.  Randomly had episodes of palpitations likely due to intermittent PACs.  Euvolemic on physical exam.  Requesting refills of medications.  Today, she fells good without any SOB or chest pain.  She usually does not have any swelling.  Does endorse swelling of her left index finger but attributes it to either injury or arthritis.  She had to cancel her echocardiogram due to COVID infection so we will reschedule that today.  Otherwise, doing well from a CV standpoint.  Tries to get some walking in.  Discussed heart healthy, low-sodium diet.  Reviewed lab work.  Reports no shortness of breath nor dyspnea on exertion. Reports no chest pain, pressure, or tightness. No edema, orthopnea, PND. Reports no palpitations.   ROS: Pertinent ROS in  HPI  Studies Reviewed: .       Cardiac CTA 03/25/2022 IMPRESSION: 1. Small distal paraesophageal hiatal hernia. Distal esophageal wall thickening, esophagitis would be a common cause. 2. 4 mm subpleural nodule in the right lower lobe. No routine follow-up imaging is recommended per Fleischner Society Guidelines. These guidelines do not apply to immunocompromised patients and patients with cancer. Follow up in patients with significant comorbidities as clinically warranted. For lung cancer screening, adhere to Lung-RADS guidelines. Reference: Radiology. 2017; 284(1):228-43. 3.  Aortic Atherosclerosis (ICD10-I70.0).       Physical Exam:   VS:  BP (!) 146/70   Pulse 71   Ht 5\' 2"  (1.575 m)   Wt 182 lb 3.2 oz (82.6 kg)   SpO2 95%   BMI 33.32 kg/m    Wt Readings from Last 3 Encounters:  08/19/23 182 lb 3.2 oz (82.6 kg)  06/09/23 186 lb 12.8 oz (84.7 kg)  01/11/23 185 lb (83.9 kg)    GEN: Well nourished, well developed in no acute distress NECK: No JVD; No carotid bruits CARDIAC: RRR, no murmurs, rubs, gallops RESPIRATORY:  Clear to auscultation without rales, wheezing or rhonchi  ABDOMEN: Soft, non-tender, non-distended EXTREMITIES:  No edema; No deformity   ASSESSMENT AND PLAN: .   1.  PAF -no issues -saw Dr. Lalla Brothers in Aug and was doing well then too -Recommend to continue current medication regimen which includes Eliquis 5 mg twice a day, aspirin 81 mg daily, Farxiga 10 mg daily, metoprolol succinate 25 mg daily Crestor 10  mg daily, Entresto 97-23 mg twice a day, spironolactone 25 mg daily   2.  HFpEF -no swelling or no SOB -Continue heart healthy, low-sodium diet -Continue current medication regimen, will compensated  3.  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  Hypertension -its been good at home -she takes her BP pills at night because she has to deal with hypotension -Would continue to  keep track of blood pressure at home an hour after medications and if elevated let us know  5.  HLD   -PCP panel last month which showed LDL 84, HDL 49, total cholesterol 696, and triglycerides 120, at goal -Would prefer LDL less than 70 and patient states that her diet was not great around the time of lab work -Continue Crestor 10 mg daily    Dispo: She can follow-up in 6 months with Dr. Odis Hollingshead  Signed, Sharlene Dory, PA-C

## 2023-08-19 ENCOUNTER — Ambulatory Visit: Payer: Medicare HMO | Attending: Cardiology | Admitting: Physician Assistant

## 2023-08-19 ENCOUNTER — Encounter: Payer: Self-pay | Admitting: Physician Assistant

## 2023-08-19 VITALS — BP 146/70 | HR 71 | Ht 62.0 in | Wt 182.2 lb

## 2023-08-19 DIAGNOSIS — I480 Paroxysmal atrial fibrillation: Secondary | ICD-10-CM

## 2023-08-19 DIAGNOSIS — I2510 Atherosclerotic heart disease of native coronary artery without angina pectoris: Secondary | ICD-10-CM

## 2023-08-19 DIAGNOSIS — I10 Essential (primary) hypertension: Secondary | ICD-10-CM | POA: Diagnosis not present

## 2023-08-19 DIAGNOSIS — I5032 Chronic diastolic (congestive) heart failure: Secondary | ICD-10-CM

## 2023-08-19 DIAGNOSIS — E785 Hyperlipidemia, unspecified: Secondary | ICD-10-CM

## 2023-08-19 NOTE — Patient Instructions (Signed)
Medication Instructions:  Your physician recommends that you continue on your current medications as directed. Please refer to the Current Medication list given to you today.  *If you need a refill on your cardiac medications before your next appointment, please call your pharmacy*  Lab Work: None ordered If you have labs (blood work) drawn today and your tests are completely normal, you will receive your results only by: MyChart Message (if you have MyChart) OR A paper copy in the mail If you have any lab test that is abnormal or we need to change your treatment, we will call you to review the results.  Testing/Procedures: Your physician has requested that you have an echocardiogram. Echocardiography is a painless test that uses sound waves to create images of your heart. It provides your doctor with information about the size and shape of your heart and how well your heart's chambers and valves are working. This procedure takes approximately one hour. There are no restrictions for this procedure. Please do NOT wear cologne, perfume, aftershave, or lotions (deodorant is allowed). Please arrive 15 minutes prior to your appointment time.   Follow-Up: At [facility], you and your health needs are our priority.  As part of our continuing mission to provide you with exceptional heart care, we have created designated Provider Care Teams.  These Care Teams include your primary Cardiologist (physician) and Advanced Practice Providers (APPs -  Physician Assistants and Nurse Practitioners) who all work together to provide you with the care you need, when you need it.  Your next appointment:   6 month(s)  Provider:   Dr Odis Hollingshead  Other Instructions Check your blood pressure daily, 1 hr after morning medications for 2 weeks, keep a log and send [place] the readings through mychart at the end of the 2 weeks.   Low-Sodium Eating Plan Salt (sodium) helps you keep a healthy balance of fluids in your  body. Too much sodium can raise your blood pressure. It can also cause fluid and waste to be held in your body. Your health care provider or dietitian may recommend a low-sodium eating plan if you have high blood pressure (hypertension), kidney disease, liver disease, or heart failure. Eating less sodium can help lower your blood pressure and reduce swelling. It can also protect your heart, liver, and kidneys. What are tips for following this plan? Reading food labels  Check food labels for the amount of sodium per serving. If you eat more than one serving, you must multiply the listed amount by the number of servings. Choose foods with less than 140 milligrams (mg) of sodium per serving. Avoid foods with 300 mg of sodium or more per serving. Always check how much sodium is in a product, even if the label says "unsalted" or "no salt added." Shopping  Buy products labeled as "low-sodium" or "no salt added." Buy fresh foods. Avoid canned foods and pre-made or frozen meals. Avoid canned, cured, or processed meats. Buy breads that have less than 80 mg of sodium per slice. Cooking  Eat more home-cooked food. Try to eat less restaurant, buffet, and fast food. Try not to add salt when you cook. Use salt-free seasonings or herbs instead of table salt or sea salt. Check with your provider or pharmacist before using salt substitutes. Cook with plant-based oils, such as canola, sunflower, or olive oil. Meal planning When eating at a restaurant, ask if your food can be made with less salt or no salt. Avoid dishes labeled as brined,  pickled, cured, or smoked. Avoid dishes made with soy sauce, miso, or teriyaki sauce. Avoid foods that have monosodium glutamate (MSG) in them. MSG may be added to some restaurant food, sauces, soups, bouillon, and canned foods. Make meals that can be grilled, baked, poached, roasted, or steamed. These are often made with less sodium. General information Try to limit your  sodium intake to 1,500-2,300 mg each day, or the amount told by your provider. What foods should I eat? Fruits Fresh, frozen, or canned fruit. Fruit juice. Vegetables Fresh or frozen vegetables. "No salt added" canned vegetables. "No salt added" tomato sauce and paste. Low-sodium or reduced-sodium tomato and vegetable juice. Grains Low-sodium cereals, such as oats, puffed wheat and rice, and shredded wheat. Low-sodium crackers. Unsalted rice. Unsalted pasta. Low-sodium bread. Whole grain breads and whole grain pasta. Meats and other proteins Fresh or frozen meat, poultry, seafood, and fish. These should have no added salt. Low-sodium canned tuna and salmon. Unsalted nuts. Dried peas, beans, and lentils without added salt. Unsalted canned beans. Eggs. Unsalted nut butters. Dairy Milk. Soy milk. Cheese that is naturally low in sodium, such as ricotta cheese, fresh mozzarella, or Swiss cheese. Low-sodium or reduced-sodium cheese. Cream cheese. Yogurt. Seasonings and condiments Fresh and dried herbs and spices. Salt-free seasonings. Low-sodium mustard and ketchup. Sodium-free salad dressing. Sodium-free light mayonnaise. Fresh or refrigerated horseradish. Lemon juice. Vinegar. Other foods Homemade, reduced-sodium, or low-sodium soups. Unsalted popcorn and pretzels. Low-salt or salt-free chips. The items listed above may not be all the foods and drinks you can have. Talk to a dietitian to learn more. What foods should I avoid? Vegetables Sauerkraut, pickled vegetables, and relishes. Olives. [place] fries. Onion rings. Regular canned vegetables, except low-sodium or reduced-sodium items. Regular canned tomato sauce and paste. Regular tomato and vegetable juice. Frozen vegetables in sauces. Grains Instant hot cereals. Bread stuffing, pancake, and biscuit mixes. Croutons. Seasoned rice or pasta mixes. Noodle soup cups. Boxed or frozen macaroni and cheese. Regular salted crackers. Self-rising  flour. Meats and other proteins Meat or fish that is salted, canned, smoked, spiced, or pickled. Precooked or cured meat, such as sausages or meat loaves. Tomasa Blase. Ham. Pepperoni. Hot dogs. Corned beef. Chipped beef. Salt pork. Jerky. Pickled herring, anchovies, and sardines. Regular canned tuna. Salted nuts. Dairy Processed cheese and cheese spreads. Hard cheeses. Cheese curds. Blue cheese. Feta cheese. String cheese. Regular cottage cheese. Buttermilk. Canned milk. Fats and oils Salted butter. Regular margarine. Ghee. Bacon fat. Seasonings and condiments Onion salt, garlic salt, seasoned salt, table salt, and sea salt. Canned and packaged gravies. Worcestershire sauce. Tartar sauce. Barbecue sauce. Teriyaki sauce. Soy sauce, including reduced-sodium soy sauce. Steak sauce. Fish sauce. Oyster sauce. Cocktail sauce. Horseradish that you find on the shelf. Regular ketchup and mustard. Meat flavorings and tenderizers. Bouillon cubes. Hot sauce. Pre-made or packaged marinades. Pre-made or packaged taco seasonings. Relishes. Regular salad dressings. Salsa. Other foods Salted popcorn and pretzels. Corn chips and puffs. Potato and tortilla chips. Canned or dried soups. Pizza. Frozen entrees and pot pies. The items listed above may not be all the foods and drinks you should avoid. Talk to a dietitian to learn more. This information is not intended to replace advice given to you by your health care provider. Make sure you discuss any questions you have with your health care provider. Document Revised: 11/11/2022 Document Reviewed: 11/11/2022 Elsevier Patient Education  2024 [employer] Heart-Healthy Eating Plan Many factors influence your heart health, including eating and exercise habits. Heart health is  also called coronary health. Coronary risk increases with abnormal blood fat (lipid) levels. A heart-healthy eating plan includes limiting unhealthy fats, increasing healthy fats, limiting salt (sodium)  intake, and making other diet and lifestyle changes. What is my plan? Your health care provider may recommend that: You limit your fat intake to _________% or less of your total calories each day. You limit your saturated fat intake to _________% or less of your total calories each day. You limit the amount of cholesterol in your diet to less than _________ mg per day. You limit the amount of sodium in your diet to less than _________ mg per day. What are tips for following this plan? Cooking Cook foods using methods other than frying. Baking, boiling, grilling, and broiling are all good options. Other ways to reduce fat include: Removing the skin from poultry. Removing all visible fats from meats. Steaming vegetables in water or broth. Meal planning  At meals, imagine dividing your plate into fourths: Fill one-half of your plate with vegetables and green salads. Fill one-fourth of your plate with whole grains. Fill one-fourth of your plate with lean protein foods. Eat 2-4 cups of vegetables per day. One cup of vegetables equals 1 cup (91 g) broccoli or cauliflower florets, 2 medium carrots, 1 large bell pepper, 1 large sweet potato, 1 large tomato, 1 medium white potato, 2 cups (150 g) raw leafy greens. Eat 1-2 cups of fruit per day. One cup of fruit equals 1 small apple, 1 large banana, 1 cup (237 g) mixed fruit, 1 large orange,  cup (82 g) dried fruit, 1 cup (240 mL) 100% fruit juice. Eat more foods that contain soluble fiber. Examples include apples, broccoli, carrots, beans, peas, and barley. Aim to get 25-30 g of fiber per day. Increase your consumption of legumes, nuts, and seeds to 4-5 servings per week. One serving of dried beans or legumes equals  cup (90 g) cooked, 1 serving of nuts is  oz (12 almonds, 24 pistachios, or 7 walnut halves), and 1 serving of seeds equals  oz (8 g). Fats Choose healthy fats more often. Choose monounsaturated and polyunsaturated fats, such as  olive and canola oils, avocado oil, flaxseeds, walnuts, almonds, and seeds. Eat more omega-3 fats. Choose salmon, mackerel, sardines, tuna, flaxseed oil, and ground flaxseeds. Aim to eat fish at least 2 times each week. Check food labels carefully to identify foods with trans fats or high amounts of saturated fat. Limit saturated fats. These are found in animal products, such as meats, butter, and cream. Plant sources of saturated fats include palm oil, palm kernel oil, and coconut oil. Avoid foods with partially hydrogenated oils in them. These contain trans fats. Examples are stick margarine, some tub margarines, cookies, crackers, and other baked goods. Avoid fried foods. General information Eat more home-cooked food and less restaurant, buffet, and fast food. Limit or avoid alcohol. Limit foods that are high in added sugar and simple starches such as foods made using white refined flour (white breads, pastries, sweets). Lose weight if you are overweight. Losing just 5-10% of your body weight can help your overall health and prevent diseases such as diabetes and heart disease. Monitor your sodium intake, especially if you have high blood pressure. Talk with your health care provider about your sodium intake. Try to incorporate more vegetarian meals weekly. What foods should I eat? Fruits All fresh, canned (in natural juice), or frozen fruits. Vegetables Fresh or frozen vegetables (raw, steamed, roasted, or grilled). Green salads.  Grains Most grains. Choose whole wheat and whole grains most of the time. Rice and pasta, including brown rice and pastas made with whole wheat. Meats and other proteins Lean, well-trimmed beef, veal, pork, and lamb. Chicken and [place] without skin. All fish and shellfish. Wild duck, rabbit, pheasant, and venison. Egg whites or low-cholesterol egg substitutes. Dried beans, peas, lentils, and tofu. Seeds and most nuts. Dairy Low-fat or nonfat cheeses, including  ricotta and mozzarella. Skim or 1% milk (liquid, powdered, or evaporated). Buttermilk made with low-fat milk. Nonfat or low-fat yogurt. Fats and oils Non-hydrogenated (trans-free) margarines. Vegetable oils, including soybean, sesame, sunflower, olive, avocado, peanut, safflower, corn, canola, and cottonseed. Salad dressings or mayonnaise made with a vegetable oil. Beverages Water (mineral or sparkling). Coffee and tea. Unsweetened ice tea. Diet beverages. Sweets and desserts Sherbet, gelatin, and fruit ice. Small amounts of dark chocolate. Limit all sweets and desserts. Seasonings and condiments All seasonings and condiments. The items listed above may not be a complete list of foods and beverages you can eat. Contact a dietitian for more options. What foods should I avoid? Fruits Canned fruit in heavy syrup. Fruit in cream or butter sauce. Fried fruit. Limit coconut. Vegetables Vegetables cooked in cheese, cream, or butter sauce. Fried vegetables. Grains Breads made with saturated or trans fats, oils, or whole milk. Croissants. Sweet rolls. Donuts. High-fat crackers, such as cheese crackers and chips. Meats and other proteins Fatty meats, such as hot dogs, ribs, sausage, bacon, rib-eye roast or steak. High-fat deli meats, such as salami and bologna. Caviar. Domestic duck and goose. Organ meats, such as liver. Dairy Cream, sour cream, cream cheese, and creamed cottage cheese. Whole-milk cheeses. Whole or 2% milk (liquid, evaporated, or condensed). Whole buttermilk. Cream sauce or high-fat cheese sauce. Whole-milk yogurt. Fats and oils Meat fat, or shortening. Cocoa butter, hydrogenated oils, palm oil, coconut oil, palm kernel oil. Solid fats and shortenings, including bacon fat, salt pork, lard, and butter. Nondairy cream substitutes. Salad dressings with cheese or sour cream. Beverages Regular sodas and any drinks with added sugar. Sweets and desserts Frosting. Pudding. Cookies. Cakes.  Pies. Milk chocolate or white chocolate. Buttered syrups. Full-fat ice cream or ice cream drinks. The items listed above may not be a complete list of foods and beverages to avoid. Contact a dietitian for more information. Summary Heart-healthy meal planning includes limiting unhealthy fats, increasing healthy fats, limiting salt (sodium) intake and making other diet and lifestyle changes. Lose weight if you are overweight. Losing just 5-10% of your body weight can help your overall health and prevent diseases such as diabetes and heart disease. Focus on eating a balance of foods, including fruits and vegetables, low-fat or nonfat dairy, lean protein, nuts and legumes, whole grains, and heart-healthy oils and fats. This information is not intended to replace advice given to you by your health care provider. Make sure you discuss any questions you have with your health care provider. Document Revised: 11/30/2021 Document Reviewed: 11/30/2021 Elsevier Patient Education  2024 [employer].

## 2023-09-02 DIAGNOSIS — Z1211 Encounter for screening for malignant neoplasm of colon: Secondary | ICD-10-CM | POA: Diagnosis not present

## 2023-09-02 DIAGNOSIS — Z1212 Encounter for screening for malignant neoplasm of rectum: Secondary | ICD-10-CM | POA: Diagnosis not present

## 2023-09-12 LAB — COLOGUARD: COLOGUARD: NEGATIVE

## 2023-09-12 LAB — EXTERNAL GENERIC LAB PROCEDURE: COLOGUARD: NEGATIVE

## 2023-09-15 ENCOUNTER — Ambulatory Visit (HOSPITAL_COMMUNITY): Payer: Medicare HMO | Attending: Physician Assistant

## 2023-09-15 DIAGNOSIS — I5032 Chronic diastolic (congestive) heart failure: Secondary | ICD-10-CM | POA: Diagnosis not present

## 2023-09-15 LAB — ECHOCARDIOGRAM COMPLETE
Area-P 1/2: 2.19 cm2
Calc EF: 51.4 %
S' Lateral: 2.5 cm
Single Plane A2C EF: 61.3 %
Single Plane A4C EF: 39.5 %

## 2023-11-24 DIAGNOSIS — Z6832 Body mass index (BMI) 32.0-32.9, adult: Secondary | ICD-10-CM | POA: Diagnosis not present

## 2023-11-24 DIAGNOSIS — I4891 Unspecified atrial fibrillation: Secondary | ICD-10-CM | POA: Diagnosis not present

## 2023-11-24 DIAGNOSIS — E785 Hyperlipidemia, unspecified: Secondary | ICD-10-CM | POA: Diagnosis not present

## 2023-11-24 DIAGNOSIS — Z79899 Other long term (current) drug therapy: Secondary | ICD-10-CM | POA: Diagnosis not present

## 2023-11-24 DIAGNOSIS — Z139 Encounter for screening, unspecified: Secondary | ICD-10-CM | POA: Diagnosis not present

## 2023-11-24 DIAGNOSIS — J309 Allergic rhinitis, unspecified: Secondary | ICD-10-CM | POA: Diagnosis not present

## 2023-11-24 DIAGNOSIS — E559 Vitamin D deficiency, unspecified: Secondary | ICD-10-CM | POA: Diagnosis not present

## 2023-11-24 DIAGNOSIS — Z8673 Personal history of transient ischemic attack (TIA), and cerebral infarction without residual deficits: Secondary | ICD-10-CM | POA: Diagnosis not present

## 2023-11-24 DIAGNOSIS — E039 Hypothyroidism, unspecified: Secondary | ICD-10-CM | POA: Diagnosis not present

## 2023-11-24 DIAGNOSIS — I10 Essential (primary) hypertension: Secondary | ICD-10-CM | POA: Diagnosis not present

## 2023-12-30 ENCOUNTER — Other Ambulatory Visit: Payer: Self-pay | Admitting: Cardiology

## 2023-12-30 DIAGNOSIS — I5032 Chronic diastolic (congestive) heart failure: Secondary | ICD-10-CM

## 2024-02-02 ENCOUNTER — Ambulatory Visit: Payer: Medicare HMO | Attending: Cardiology | Admitting: Cardiology

## 2024-02-02 VITALS — BP 136/78 | HR 53 | Resp 16 | Ht 62.0 in | Wt 182.8 lb

## 2024-02-02 DIAGNOSIS — E782 Mixed hyperlipidemia: Secondary | ICD-10-CM | POA: Diagnosis not present

## 2024-02-02 DIAGNOSIS — I6381 Other cerebral infarction due to occlusion or stenosis of small artery: Secondary | ICD-10-CM

## 2024-02-02 DIAGNOSIS — I2510 Atherosclerotic heart disease of native coronary artery without angina pectoris: Secondary | ICD-10-CM

## 2024-02-02 DIAGNOSIS — I5032 Chronic diastolic (congestive) heart failure: Secondary | ICD-10-CM | POA: Diagnosis not present

## 2024-02-02 DIAGNOSIS — I10 Essential (primary) hypertension: Secondary | ICD-10-CM

## 2024-02-02 DIAGNOSIS — I4819 Other persistent atrial fibrillation: Secondary | ICD-10-CM

## 2024-02-02 DIAGNOSIS — Z87891 Personal history of nicotine dependence: Secondary | ICD-10-CM

## 2024-02-02 DIAGNOSIS — Z7901 Long term (current) use of anticoagulants: Secondary | ICD-10-CM | POA: Diagnosis not present

## 2024-02-02 DIAGNOSIS — Z951 Presence of aortocoronary bypass graft: Secondary | ICD-10-CM | POA: Diagnosis not present

## 2024-02-02 DIAGNOSIS — I480 Paroxysmal atrial fibrillation: Secondary | ICD-10-CM | POA: Diagnosis not present

## 2024-02-02 MED ORDER — ROSUVASTATIN CALCIUM 20 MG PO TABS: 20.0000 mg | ORAL_TABLET | Freq: Every day | ORAL | 3 refills | Status: DC

## 2024-02-02 NOTE — Progress Notes (Signed)
 Cardiology Office Note:  .   ID:  Jacklynn Dehaas, DOB 11/21/1945, MRN [id number] PCP:  Lucianne Lei, MD  Former Cardiology Providers: None [facility] Providers Cardiologist:  Tessa Lerner, DO, [facility] (established care 05/23/2021)  Electrophysiologist:  Lanier Prude, MD  Electrophysiologist:  Lanier Prude, MD  Click to update primary MD,subspecialty MD or APP then REFRESH:1}    Chief Complaint  Patient presents with   Paroxysmal atrial fibrillation   Follow-up    History of Present Illness: .   Doll Frazee is a 78 y.o. Caucasian female whose past medical history and cardiovascular risk factors includes: Persistentatrial fibrillation status post TEE guided cardioversion & afib ablation, established coronary artery disease status post three-vessel CABG (2013), recovered cardiomyopathy, hypertension, hyperlipidemia, history of left-sided breast cancer status postmastectomy, former smoker, postmenopausal female, advanced age, obesity due to excess calories.   In July 2022 patient presented to ED with right-sided focal neurological deficits and received tPA and her symptoms resolved the following day.  Stroke workup noted atrial fibrillation on surface ECG and she was also noted to have reduced LVEF on echocardiogram.  She was started on treatment for atrial fibrillation after cleared by neurology and started on GDMT for her cardiomyopathy.   Despite medical therapy she remained in A-fib and underwent TEE guided cardioversion which restored normal sinus rhythm.  However, by the time she followed up 2 weeks later she was back in A-fib.  She did follow-up with Dr. Lalla Brothers underwent catheter directed ablation for atrial fibrillation.  Post ablation she is back in A-fib and underwent another cardioversion with Dr. Shari Prows in July 2023.  Since last office visit in March 2024 patient has followed up with Jari Favre in October 2024 at which time she was doing well and patient was  recommended to have a repeat echocardiogram to evaluate LVEF post A-fib ablation.  She presents today for 37-month follow-up visit.  Since last office visit patient denies any anginal chest pain or heart failure symptoms.  No hospitalizations or urgent care visits for cardiovascular reasons.  She has been compliant with her medical therapy.  Patient has lost 3 pounds in the last office visit.  Physical endurance remains stable.  Review of Systems: .   Review of Systems  Cardiovascular:  Negative for chest pain, claudication, irregular heartbeat, leg swelling, near-syncope, orthopnea, palpitations, paroxysmal nocturnal dyspnea and syncope.  Respiratory:  Negative for shortness of breath.   Hematologic/Lymphatic: Negative for bleeding problem.    Studies Reviewed:   Atrial fibrillation ablation 03/2022: Provider: Dr. Lalla Brothers. 1. Successful PVI 2. Successful ablation/isolation of the   EKG: EKG Interpretation Date/Time:  Thursday February 02 2024 10:57:22 EDT Ventricular Rate:  53 PR Interval:  188 QRS Duration:  66 QT Interval:  420 QTC Calculation: 394 R Axis:   -35  Text Interpretation: Sinus bradycardia Left axis deviation Low voltage QRS Cannot rule out Anterior infarct (cited on or before 02-Feb-2024) When compared with ECG of 09-Jun-2023 11:37, No significant change was found Confirmed by Tessa Lerner [phone]) on 02/02/2024 11:46:21 AM  Echocardiogram: 05/2021: LVEF 30 to 35%  09/2021: LVEF 50-55%, see report for additional details  09/2023 Left ventricular ejection fraction, by estimation, is 50 to 55%. Left ventricular ejection fraction by 2D MOD biplane is 51.4 %. The left ventricle has low normal function. The left ventricle has no regional wall motion abnormalities. Left ventricular diastolic parameters were normal.   2. Right ventricular systolic function is normal. The right ventricular size is normal.  3. Left atrial size was moderately dilated.   4. The mitral valve is  grossly normal. Trivial mitral valve regurgitation.   5. The aortic valve is tricuspid. Aortic valve regurgitation is not visualized.   6. The inferior vena cava is normal in size with greater than 50% respiratory variability, suggesting right atrial pressure of 3 mmHg.   Stress Testing: Lexiscan Tetrofosmin stress test 09/30/2021: Low risk study  RADIOLOGY: NA  Risk Assessment/Calculations:   NA   Labs:       Latest Ref Rng & Units 07/13/2023    9:40 AM 12/27/2022   11:24 AM 07/01/2022    2:25 PM  CBC  WBC 3.4 - 10.8 x10E3/uL  8.1  7.5   Hemoglobin 11.1 - 15.9 g/dL 16.1  09.6  04.5   Hematocrit 34.0 - 46.6 % 39.8  39.2  40.8   Platelets 150 - 450 x10E3/uL  168  195        Latest Ref Rng & Units 07/13/2023    9:40 AM 12/27/2022   11:24 AM 04/30/2022    2:30 PM  BMP  Glucose 70 - 99 mg/dL 97  409  811   BUN 8 - 27 mg/dL 23  20  19    Creatinine 0.57 - 1.00 mg/dL 9.14  7.82  9.56   BUN/Creat Ratio 12 - 28 20  18     Sodium 134 - 144 mmol/L 141  139  139   Potassium 3.5 - 5.2 mmol/L 4.1  4.3  4.5   Chloride 96 - 106 mmol/L 107  104  109   CO2 20 - 29 mmol/L 20  21  22    Calcium 8.7 - 10.3 mg/dL 9.2  9.3  9.2       Latest Ref Rng & Units 07/13/2023    9:40 AM 12/27/2022   11:24 AM 04/30/2022    2:30 PM  CMP  Glucose 70 - 99 mg/dL 97  213  086   BUN 8 - 27 mg/dL 23  20  19    Creatinine 0.57 - 1.00 mg/dL 5.78  4.69  6.29   Sodium 134 - 144 mmol/L 141  139  139   Potassium 3.5 - 5.2 mmol/L 4.1  4.3  4.5   Chloride 96 - 106 mmol/L 107  104  109   CO2 20 - 29 mmol/L 20  21  22    Calcium 8.7 - 10.3 mg/dL 9.2  9.3  9.2   Total Protein 6.0 - 8.5 g/dL 6.8     Total Bilirubin 0.0 - 1.2 mg/dL 0.5     Alkaline Phos 44 - 121 IU/L 86     AST 0 - 40 IU/L 15     ALT 0 - 32 IU/L 13       Lab Results  Component Value Date   CHOL 154 07/13/2023   HDL 49 07/13/2023   LDLCALC 82 07/13/2023   LDLDIRECT 84 07/13/2023   TRIG 128 07/13/2023   CHOLHDL 2.9 05/23/2021   No results for  input(s): "LIPOA" in the last 8760 hours. No components found for: "NTPROBNP" No results for input(s): "PROBNP" in the last 8760 hours. No results for input(s): "TSH" in the last 8760 hours.  External Labs: Collected: November 24, 2023 provided by [facility] Total cholesterol 153, triglycerides 123, HDL 50, LDL calculated 81 BUN 23, creatinine 1.18. eGFR 48. Potassium 4.2. AST, ALT, alkaline phosphatase within normal limits. TSH 0.573. Hemoglobin 14  Physical Exam:    Today's  Vitals   02/02/24 1053  BP: 136/78  Pulse: (!) 53  Resp: 16  SpO2: 98%  Weight: 182 lb 12.8 oz (82.9 kg)  Height: 5\' 2"  (1.575 m)   Body mass index is 33.43 kg/m. Wt Readings from Last 3 Encounters:  02/02/24 182 lb 12.8 oz (82.9 kg)  08/19/23 182 lb 3.2 oz (82.6 kg)  06/09/23 186 lb 12.8 oz (84.7 kg)    Physical Exam  Constitutional: No distress.  Age appropriate, hemodynamically stable.   Neck: No JVD present.  Cardiovascular: Normal rate, regular rhythm, S1 normal, S2 normal, intact distal pulses and normal pulses. Exam reveals no gallop, no S3 and no S4.  Murmur heard. Holosystolic murmur is present with a grade of 3/6 at the apex. Pulses:      Dorsalis pedis pulses are 2+ on the right side and 2+ on the left side.       Posterior tibial pulses are 2+ on the right side and 2+ on the left side.  Pulmonary/Chest: Effort normal and breath sounds normal. No stridor. She has no wheezes. She has no rales.  Abdominal: Soft. Bowel sounds are normal. She exhibits no distension. There is no abdominal tenderness.  Musculoskeletal:        General: No edema.     Cervical back: Neck supple.  Neurological: She is alert and oriented to person, place, and time. She has intact cranial nerves (2-12).  Skin: Skin is warm and moist.     Impression & Recommendation(s):  Impression:   ICD-10-CM   1. Persistent atrial fibrillation (HCC)  I48.19 EKG 12-Lead    Basic metabolic panel with  GFR    Hemoglobin and hematocrit, blood    Hemoglobin and hematocrit, blood    Basic metabolic panel with GFR    2. Long term (current) use of anticoagulants  Z79.01 Basic metabolic panel with GFR    Hemoglobin and hematocrit, blood    Hemoglobin and hematocrit, blood    Basic metabolic panel with GFR    3. Chronic heart failure with preserved ejection fraction (HCC)  I50.32     4. Coronary artery disease involving native coronary artery of native heart without angina pectoris  I25.10     5. Hx of CABG  Z95.1     6. Essential hypertension  I10     7. Mixed hyperlipidemia  E78.2 Lipid panel    Comprehensive metabolic panel with GFR    rosuvastatin (CRESTOR) 20 MG tablet    Comprehensive metabolic panel with GFR    Lipid panel    8. Lacunar stroke (HCC)  I63.81     9. Former smoker  Z87.891        Recommendation(s):  Persistent atrial fibrillation (HCC) Maintains in sinus rhythm. Status post cardioversions and atrial fibrillation ablation. Rate control: Toprol-XL. Rhythm control: N/A. Thromboembolic prophylaxis: Eliquis  Long term (current) use of anticoagulants Does not endorse evidence of bleeding. Will check H&H and BNP in June 2025 as well as March 2026 Risk, benefits, and alternatives to anticoagulation discussed.  Chronic heart failure with preserved ejection fraction (HCC) Stage B, NYHA class II. Continue spironolactone 25 mg p.o. daily. Continue Entresto 97/103 mg p.o. twice daily continue Toprol-XL 25 mg p.o. daily. Continue Farxiga 10 mg p.o. daily Most recent echocardiogram notes from low normal LVEF. Monitor for now  Coronary artery disease involving native coronary artery of native heart without angina pectoris Hx of CABG Denies anginal chest pain or heart failure symptoms.   Continue aspirin  and statin therapy. Based on most recent lipid profile from January 2025 LDL is 81 mg/dL.  Given her history of stroke and coronary disease status post CABG  recommend a goal LDL closer to 55 mg/dL Will increase Crestor to 20 mg p.o. nightly with repeat fasting lipids in 6 weeks to reevaluate therapy  Essential hypertension Home blood pressures are well-controlled. Medications as discussed above  Mixed hyperlipidemia Will increase Crestor to 20 mg p.o. nightly with fasting lipids in 6 weeks as discussed above  Orders Placed:  Orders Placed This Encounter  Procedures   Lipid panel    Standing Status:   Future    Number of Occurrences:   1    Expected Date:   03/15/2024    Expiration Date:   02/01/2025   Comprehensive metabolic panel with GFR    Standing Status:   Future    Number of Occurrences:   1    Expected Date:   03/15/2024    Expiration Date:   02/01/2025   Basic metabolic panel with GFR    Standing Status:   Future    Number of Occurrences:   1    Expected Date:   01/07/2025    Expiration Date:   02/01/2025   Hemoglobin and hematocrit, blood    Standing Status:   Future    Number of Occurrences:   1    Expected Date:   01/07/2025    Expiration Date:   02/01/2025   EKG 12-Lead     Final Medication List:    Meds ordered this encounter  Medications   rosuvastatin (CRESTOR) 20 MG tablet    Sig: Take 1 tablet (20 mg total) by mouth daily.    Dispense:  30 tablet    Refill:  3    Increased to 20 mg    Medications Discontinued During This Encounter  Medication Reason   BREO ELLIPTA 200-25 MCG/ACT AEPB Dose change   rosuvastatin (CRESTOR) 10 MG tablet Dose change     Current Outpatient Medications:    albuterol (VENTOLIN HFA) 108 (90 Base) MCG/ACT inhaler, Inhale 2 puffs into the lungs daily as needed (cough)., Disp: , Rfl:    apixaban (ELIQUIS) 5 MG TABS tablet, Take 1 tablet (5 mg total) by mouth 2 (two) times daily., Disp: 180 tablet, Rfl: 3   AREXVY 120 MCG/0.5ML injection, Inject 0.5 mLs into the muscle once., Disp: , Rfl:    aspirin 81 MG EC tablet, Take 81 mg by mouth daily., Disp: , Rfl:    BREO ELLIPTA 100-25 MCG/ACT  AEPB, Inhale 1 puff into the lungs daily., Disp: , Rfl:    cholecalciferol (VITAMIN D3) 25 MCG (1000 UNIT) tablet, Take 1,000 Units by mouth daily., Disp: , Rfl:    dapagliflozin propanediol (FARXIGA) 10 MG TABS tablet, Take 1 tablet (10 mg total) by mouth daily before breakfast., Disp: 90 tablet, Rfl: 3   esomeprazole (NEXIUM) 40 MG capsule, Take 40 mg by mouth daily., Disp: , Rfl:    famotidine (PEPCID) 20 MG tablet, Take 20 mg by mouth as needed for heartburn., Disp: , Rfl:    FLUZONE HIGH-DOSE 0.5 ML injection, Inject 0.5 mLs into the muscle once., Disp: , Rfl:    levothyroxine (SYNTHROID) 125 MCG tablet, Take 125 mcg by mouth daily before breakfast., Disp: , Rfl:    loratadine (CLARITIN) 10 MG tablet, Take 10 mg by mouth daily., Disp: , Rfl:    metoprolol succinate (TOPROL-XL) 25 MG 24 hr tablet, Take 1  tablet (25 mg total) by mouth daily. Take with or immediately following a meal., Disp: 90 tablet, Rfl: 3   montelukast (SINGULAIR) 10 MG tablet, Take 10 mg by mouth at bedtime., Disp: , Rfl:    Multiple Vitamins-Minerals (MULTIVITAMIN WITH MINERALS) tablet, Take 1 tablet by mouth daily., Disp: , Rfl:    rosuvastatin (CRESTOR) 20 MG tablet, Take 1 tablet (20 mg total) by mouth daily., Disp: 30 tablet, Rfl: 3   sacubitril-valsartan (ENTRESTO) 97-103 MG, TAKE 1 TABLET BY MOUTH TWICE A DAY, Disp: 180 tablet, Rfl: 2   spironolactone (ALDACTONE) 25 MG tablet, TAKE 1 TABLET BY MOUTH EVERY DAY IN THE MORNING, Disp: 90 tablet, Rfl: 2   vitamin B-12 (CYANOCOBALAMIN) 1000 MCG tablet, Take 1,000 mcg by mouth daily., Disp: , Rfl:    vitamin C (ASCORBIC ACID) 500 MG tablet, Take 500 mg by mouth daily., Disp: , Rfl:   Consent:   NA  Disposition:   1 year follow-up sooner if needed  Her questions and concerns were addressed to her satisfaction. She voices understanding of the recommendations provided during this encounter.    Signed, Tessa Lerner, DO, [facility]  [facility]  [address] [place], [place] [address]

## 2024-02-02 NOTE — Patient Instructions (Addendum)
 Medication Instructions:  Your physician has recommended you make the following change in your medication:   INCREASE Rosuvastatin (Crestor) to 20 mg once daily    *If you need a refill on your cardiac medications before your next appointment, please call your pharmacy*  Lab Work: To be completed in 6 weeks: FASTING lipid panel, CMP (approximately 03/15/24)  To be completed 1 week before your 1 year follow-up appointment with Dr. Odis Hollingshead: BMP and hemoglobin/hematocrit  If you have labs (blood work) drawn today and your tests are completely normal, you will receive your results only by: MyChart Message (if you have MyChart) OR A paper copy in the mail If you have any lab test that is abnormal or we need to change your treatment, we will call you to review the results.  Testing/Procedures: None ordered today.  Follow-Up: At [facility], you and your health needs are our priority.  As part of our continuing mission to provide you with exceptional heart care, we have created designated Provider Care Teams.  These Care Teams include your primary Cardiologist (physician) and Advanced Practice Providers (APPs -  Physician Assistants and Nurse Practitioners) who all work together to provide you with the care you need, when you need it.  We recommend signing up for the patient portal called "MyChart".  Sign up information is provided on this After Visit Summary.  MyChart is used to connect with patients for Virtual Visits (Telemedicine).  Patients are able to view lab/test results, encounter notes, upcoming appointments, etc.  Non-urgent messages can be sent to your provider as well.   To learn more about what you can do with MyChart, go to [email or web address].    Your next appointment:   1 year(s)  The format for your next appointment:   In Person  Provider:   Tessa Lerner, DO {  Other Instructions   1st Floor: - Lobby - Registration  - Pharmacy  - Lab - Cafe  2nd Floor: - PV  Lab - Diagnostic Testing (echo, CT, nuclear med)  3rd Floor: - Vacant  4th Floor: - TCTS (cardiothoracic surgery) - AFib Clinic - Structural Heart Clinic - Vascular Surgery  - Vascular Ultrasound  5th Floor: - HeartCare Cardiology (general and EP) - Clinical Pharmacy for coumadin, hypertension, lipid, weight-loss medications, and med management appointments    Valet parking services will be available as well.

## 2024-02-08 ENCOUNTER — Encounter: Payer: Self-pay | Admitting: Cardiology

## 2024-02-11 DIAGNOSIS — M549 Dorsalgia, unspecified: Secondary | ICD-10-CM | POA: Diagnosis not present

## 2024-02-11 DIAGNOSIS — R319 Hematuria, unspecified: Secondary | ICD-10-CM | POA: Diagnosis not present

## 2024-02-14 DIAGNOSIS — I10 Essential (primary) hypertension: Secondary | ICD-10-CM | POA: Diagnosis not present

## 2024-02-14 DIAGNOSIS — Z6833 Body mass index (BMI) 33.0-33.9, adult: Secondary | ICD-10-CM | POA: Diagnosis not present

## 2024-02-14 DIAGNOSIS — J309 Allergic rhinitis, unspecified: Secondary | ICD-10-CM | POA: Diagnosis not present

## 2024-02-14 DIAGNOSIS — M545 Low back pain, unspecified: Secondary | ICD-10-CM | POA: Diagnosis not present

## 2024-03-14 DIAGNOSIS — E782 Mixed hyperlipidemia: Secondary | ICD-10-CM | POA: Diagnosis not present

## 2024-03-14 LAB — LIPID PANEL

## 2024-03-15 ENCOUNTER — Telehealth: Payer: Self-pay | Admitting: Cardiology

## 2024-03-15 LAB — LIPID PANEL
Cholesterol, Total: 156 mg/dL (ref 100–199)
HDL: 67 mg/dL (ref >39)
LDL CALC COMMENT:: 2.3 ratio (ref 0.0–4.4)
LDL Chol Calc (NIH): 75 mg/dL (ref 0–99)
Triglycerides: 71 mg/dL (ref 0–149)
VLDL Cholesterol Cal: 14 mg/dL (ref 5–40)

## 2024-03-15 LAB — COMPREHENSIVE METABOLIC PANEL WITH GFR
ALT: 17 IU/L (ref 0–32)
AST: 15 IU/L (ref 0–40)
Albumin: 4.9 g/dL — ABNORMAL HIGH (ref 3.8–4.8)
Alkaline Phosphatase: 87 IU/L (ref 44–121)
BUN/Creatinine Ratio: 19 (ref 12–28)
BUN: 22 mg/dL (ref 8–27)
Bilirubin Total: 0.6 mg/dL (ref 0.0–1.2)
CO2: 19 mmol/L — ABNORMAL LOW (ref 20–29)
Calcium: 9.6 mg/dL (ref 8.7–10.3)
Chloride: 106 mmol/L (ref 96–106)
Creatinine, Ser: 1.13 mg/dL — ABNORMAL HIGH (ref 0.57–1.00)
Globulin, Total: 2.2 g/dL (ref 1.5–4.5)
Glucose: 95 mg/dL (ref 70–99)
Potassium: 4.6 mmol/L (ref 3.5–5.2)
Sodium: 140 mmol/L (ref 134–144)
Total Protein: 7.1 g/dL (ref 6.0–8.5)
eGFR: 50 mL/min/{1.73_m2} — ABNORMAL LOW (ref >59)

## 2024-03-15 NOTE — Telephone Encounter (Signed)
 Pt c/o medication issue:  1. Name of Medication:   rosuvastatin  (CRESTOR ) 20 MG tablet    2. How are you currently taking this medication (dosage and times per day)?  Take 1 tablet (20 mg total) by mouth daily.      3. Are you having a reaction (difficulty breathing--STAT)? No  4. What is your medication issue? Pt is requesting a callback regarding her being advised to increase this medication to 40mg  but she stated it causes her to have cramps so bad that it brings tears to her eyes. She'd like to discuss further with a nurse. Please advise

## 2024-03-15 NOTE — Telephone Encounter (Signed)
 Spoke with patient and she states she will not increase her Crestor  to 40 mg it cause her to have cramps that are unbearable. She states she can barely tolerate the 20 mg. She would like a call to discuss other options

## 2024-03-16 NOTE — Telephone Encounter (Signed)
 Since she is unable to tolerate the higher dose of Crestor  40 mg p.o. daily.  However continue 20 mg p.o. daily for now and please send in a prescription for Nexlizet 1 tablet daily.  If the prescription is failed and she is able to tolerate it would recommend as originally planned to follow-up lipids in 6 weeks.  Norwood Quezada [place], DO, [facility]

## 2024-03-19 ENCOUNTER — Other Ambulatory Visit: Payer: Self-pay

## 2024-03-19 ENCOUNTER — Other Ambulatory Visit: Payer: Self-pay | Admitting: Cardiology

## 2024-03-19 DIAGNOSIS — E782 Mixed hyperlipidemia: Secondary | ICD-10-CM

## 2024-03-19 MED ORDER — NEXLIZET 180-10 MG PO TABS: 1.0000 | ORAL_TABLET | Freq: Every morning | ORAL | 3 refills | Status: DC

## 2024-03-25 NOTE — Telephone Encounter (Signed)
 She is Crestor  20mg  po qday.   Given her hyperlipidemia, CAD, CABG, and statin intolerance recommended starting Nexlizet .   Not sure why they would not cover Nexlizet  please see if there is any alternatives.  Terris Germano [place], DO, FACC

## 2024-03-27 ENCOUNTER — Other Ambulatory Visit: Payer: Self-pay | Admitting: Cardiology

## 2024-03-27 ENCOUNTER — Telehealth: Payer: Self-pay | Admitting: Pharmacy Technician

## 2024-03-27 ENCOUNTER — Other Ambulatory Visit (HOSPITAL_COMMUNITY): Payer: Self-pay

## 2024-03-27 DIAGNOSIS — E782 Mixed hyperlipidemia: Secondary | ICD-10-CM

## 2024-03-27 MED ORDER — NEXLIZET 180-10 MG PO TABS
1.0000 | ORAL_TABLET | Freq: Every morning | ORAL | 3 refills | Status: DC
  Filled 2024-03-27: qty 30, 30d supply, fill #0

## 2024-03-27 NOTE — Telephone Encounter (Signed)
 Please complete PA for Nexlizet

## 2024-03-27 NOTE — Addendum Note (Signed)
 Addended by: Sunny English on: 03/27/2024 05:00 PM   Modules accepted: Orders

## 2024-03-27 NOTE — Telephone Encounter (Signed)
 Pharmacy Patient Advocate Encounter  Received notification from [employer] that Prior Authorization for Nexlizet  has been APPROVED from 11/09/23 to 11/07/24. Ran test claim, Copay is $0.00- one month. This test claim was processed through [facility] copay amounts may vary at other pharmacies due to pharmacy/plan contracts, or as the patient moves through the different stages of their insurance plan.   PA #/Case ID/Reference #: [id number]

## 2024-03-27 NOTE — Telephone Encounter (Signed)
 Pharmacy Patient Advocate Encounter   Received notification from Pt Calls Messages that prior authorization for Nexlizet  is required/requested.   Insurance verification completed.   The patient is insured through [employer] .   Per test claim: PA required; PA submitted to above mentioned insurance via CoverMyMeds Key/confirmation #/EOC [id number] Status is pending

## 2024-04-10 DIAGNOSIS — R059 Cough, unspecified: Secondary | ICD-10-CM | POA: Diagnosis not present

## 2024-04-10 DIAGNOSIS — E559 Vitamin D deficiency, unspecified: Secondary | ICD-10-CM | POA: Diagnosis not present

## 2024-04-10 DIAGNOSIS — Z79899 Other long term (current) drug therapy: Secondary | ICD-10-CM | POA: Diagnosis not present

## 2024-04-10 DIAGNOSIS — M545 Low back pain, unspecified: Secondary | ICD-10-CM | POA: Diagnosis not present

## 2024-04-10 DIAGNOSIS — E785 Hyperlipidemia, unspecified: Secondary | ICD-10-CM | POA: Diagnosis not present

## 2024-04-10 DIAGNOSIS — E039 Hypothyroidism, unspecified: Secondary | ICD-10-CM | POA: Diagnosis not present

## 2024-04-10 DIAGNOSIS — J309 Allergic rhinitis, unspecified: Secondary | ICD-10-CM | POA: Diagnosis not present

## 2024-04-10 DIAGNOSIS — I10 Essential (primary) hypertension: Secondary | ICD-10-CM | POA: Diagnosis not present

## 2024-04-10 DIAGNOSIS — K219 Gastro-esophageal reflux disease without esophagitis: Secondary | ICD-10-CM | POA: Diagnosis not present

## 2024-04-10 DIAGNOSIS — Z8673 Personal history of transient ischemic attack (TIA), and cerebral infarction without residual deficits: Secondary | ICD-10-CM | POA: Diagnosis not present

## 2024-05-02 ENCOUNTER — Other Ambulatory Visit: Payer: Self-pay | Admitting: Cardiology

## 2024-05-02 DIAGNOSIS — E782 Mixed hyperlipidemia: Secondary | ICD-10-CM

## 2024-05-07 ENCOUNTER — Other Ambulatory Visit: Payer: Self-pay | Admitting: Cardiology

## 2024-05-07 DIAGNOSIS — I5022 Chronic systolic (congestive) heart failure: Secondary | ICD-10-CM

## 2024-05-07 DIAGNOSIS — I4819 Other persistent atrial fibrillation: Secondary | ICD-10-CM

## 2024-06-18 ENCOUNTER — Other Ambulatory Visit: Payer: Self-pay | Admitting: Cardiology

## 2024-06-18 DIAGNOSIS — I2510 Atherosclerotic heart disease of native coronary artery without angina pectoris: Secondary | ICD-10-CM

## 2024-06-18 DIAGNOSIS — Z951 Presence of aortocoronary bypass graft: Secondary | ICD-10-CM

## 2024-06-18 DIAGNOSIS — I429 Cardiomyopathy, unspecified: Secondary | ICD-10-CM

## 2024-06-19 ENCOUNTER — Telehealth: Payer: Self-pay | Admitting: Cardiology

## 2024-06-19 DIAGNOSIS — I429 Cardiomyopathy, unspecified: Secondary | ICD-10-CM

## 2024-06-19 DIAGNOSIS — I4819 Other persistent atrial fibrillation: Secondary | ICD-10-CM

## 2024-06-19 DIAGNOSIS — I2510 Atherosclerotic heart disease of native coronary artery without angina pectoris: Secondary | ICD-10-CM

## 2024-06-19 DIAGNOSIS — Z7901 Long term (current) use of anticoagulants: Secondary | ICD-10-CM

## 2024-06-19 DIAGNOSIS — Z951 Presence of aortocoronary bypass graft: Secondary | ICD-10-CM

## 2024-06-19 MED ORDER — DAPAGLIFLOZIN PROPANEDIOL 10 MG PO TABS: 10.0000 mg | ORAL_TABLET | Freq: Every day | ORAL | 3 refills | Status: AC

## 2024-06-19 MED ORDER — APIXABAN 5 MG PO TABS: 5.0000 mg | ORAL_TABLET | Freq: Two times a day (BID) | ORAL | 3 refills | Status: AC

## 2024-06-19 NOTE — Telephone Encounter (Signed)
 Spoke with pt. Farxiga  10 mg  and Eliquis  5 mg was sent to [facility] per pts request.

## 2024-06-19 NOTE — Telephone Encounter (Signed)
*  STAT* If patient is at the pharmacy, call can be transferred to refill team.   1. Which medications need to be refilled? (please list name of each medication and dose if known)   apixaban  (ELIQUIS ) 5 MG TABS tablet    dapagliflozin  propanediol (FARXIGA ) 10 MG TABS tablet    2. Which pharmacy/location (including street and city if local pharmacy) is medication to be sent to?  [id number] - [place], [place] - [address]      3. Do they need a 30 day or 90 day supply? 90 day

## 2024-06-23 ENCOUNTER — Other Ambulatory Visit: Payer: Self-pay | Admitting: Cardiology

## 2024-06-23 DIAGNOSIS — I5032 Chronic diastolic (congestive) heart failure: Secondary | ICD-10-CM

## 2024-07-24 DIAGNOSIS — Z8673 Personal history of transient ischemic attack (TIA), and cerebral infarction without residual deficits: Secondary | ICD-10-CM | POA: Diagnosis not present

## 2024-07-24 DIAGNOSIS — Z6833 Body mass index (BMI) 33.0-33.9, adult: Secondary | ICD-10-CM | POA: Diagnosis not present

## 2024-07-24 DIAGNOSIS — J309 Allergic rhinitis, unspecified: Secondary | ICD-10-CM | POA: Diagnosis not present

## 2024-07-24 DIAGNOSIS — I10 Essential (primary) hypertension: Secondary | ICD-10-CM | POA: Diagnosis not present

## 2024-07-24 DIAGNOSIS — K219 Gastro-esophageal reflux disease without esophagitis: Secondary | ICD-10-CM | POA: Diagnosis not present

## 2024-07-24 DIAGNOSIS — E559 Vitamin D deficiency, unspecified: Secondary | ICD-10-CM | POA: Diagnosis not present

## 2024-07-24 DIAGNOSIS — E785 Hyperlipidemia, unspecified: Secondary | ICD-10-CM | POA: Diagnosis not present

## 2024-07-24 DIAGNOSIS — M545 Low back pain, unspecified: Secondary | ICD-10-CM | POA: Diagnosis not present

## 2024-07-24 DIAGNOSIS — E039 Hypothyroidism, unspecified: Secondary | ICD-10-CM | POA: Diagnosis not present

## 2024-07-30 ENCOUNTER — Telehealth: Payer: Self-pay | Admitting: Cardiology

## 2024-07-30 DIAGNOSIS — I5032 Chronic diastolic (congestive) heart failure: Secondary | ICD-10-CM

## 2024-07-30 MED ORDER — SACUBITRIL-VALSARTAN 97-103 MG PO TABS: 1.0000 | ORAL_TABLET | Freq: Two times a day (BID) | ORAL | 1 refills | Status: AC

## 2024-07-30 MED ORDER — SPIRONOLACTONE 25 MG PO TABS: 25.0000 mg | ORAL_TABLET | Freq: Every morning | ORAL | 1 refills | Status: AC

## 2024-07-30 NOTE — Telephone Encounter (Signed)
*  STAT* If patient is at the pharmacy, call can be transferred to refill team.   1. Which medications need to be refilled? (please list name of each medication and dose if known) sacubitril -valsartan  (ENTRESTO ) 97-103 MG   spironolactone  (ALDACTONE ) 25 MG tablet   2. Which pharmacy/location (including street and city if local pharmacy) is medication to be sent to?  [id number] - [place], [place] - [address]    3. Do they need a 30 day or 90 day supply? 90

## 2024-07-30 NOTE — Telephone Encounter (Signed)
 Pt's medications were sent to pt's pharmacy as requested. Confirmation received.

## 2024-09-25 ENCOUNTER — Ambulatory Visit (INDEPENDENT_AMBULATORY_CARE_PROVIDER_SITE_OTHER)
Admission: RE | Admit: 2024-09-25 | Discharge: 2024-09-25 | Disposition: A | Discharge status: Home / Self Care | Source: Ambulatory Visit | Attending: Student | Admitting: Student

## 2024-09-25 ENCOUNTER — Encounter (HOSPITAL_BASED_OUTPATIENT_CLINIC_OR_DEPARTMENT_OTHER): Payer: Self-pay | Admitting: Student

## 2024-09-25 ENCOUNTER — Ambulatory Visit (INDEPENDENT_AMBULATORY_CARE_PROVIDER_SITE_OTHER): Admitting: Student

## 2024-09-25 VITALS — BP 148/75 | HR 54 | Temp 98.3°F | Resp 16 | Ht 61.22 in | Wt 177.4 lb

## 2024-09-25 DIAGNOSIS — Z780 Asymptomatic menopausal state: Secondary | ICD-10-CM

## 2024-09-25 DIAGNOSIS — I4819 Other persistent atrial fibrillation: Secondary | ICD-10-CM | POA: Diagnosis not present

## 2024-09-25 DIAGNOSIS — M25551 Pain in right hip: Secondary | ICD-10-CM

## 2024-09-25 DIAGNOSIS — Z7689 Persons encountering health services in other specified circumstances: Secondary | ICD-10-CM

## 2024-09-25 DIAGNOSIS — I10 Essential (primary) hypertension: Secondary | ICD-10-CM | POA: Diagnosis not present

## 2024-09-25 DIAGNOSIS — M47816 Spondylosis without myelopathy or radiculopathy, lumbar region: Secondary | ICD-10-CM | POA: Diagnosis not present

## 2024-09-25 DIAGNOSIS — I5022 Chronic systolic (congestive) heart failure: Secondary | ICD-10-CM | POA: Diagnosis not present

## 2024-09-25 DIAGNOSIS — R053 Chronic cough: Secondary | ICD-10-CM

## 2024-09-25 DIAGNOSIS — K219 Gastro-esophageal reflux disease without esophagitis: Secondary | ICD-10-CM | POA: Diagnosis not present

## 2024-09-25 DIAGNOSIS — E039 Hypothyroidism, unspecified: Secondary | ICD-10-CM | POA: Diagnosis not present

## 2024-09-25 DIAGNOSIS — M160 Bilateral primary osteoarthritis of hip: Secondary | ICD-10-CM | POA: Diagnosis not present

## 2024-09-25 DIAGNOSIS — E785 Hyperlipidemia, unspecified: Secondary | ICD-10-CM | POA: Insufficient documentation

## 2024-09-25 DIAGNOSIS — G8929 Other chronic pain: Secondary | ICD-10-CM | POA: Diagnosis not present

## 2024-09-25 MED ORDER — PANTOPRAZOLE SODIUM 40 MG PO TBEC: 40.0000 mg | DELAYED_RELEASE_TABLET | Freq: Every day | ORAL | 6 refills | Status: DC

## 2024-09-25 NOTE — Progress Notes (Unsigned)
 New Patient Office Visit  Subjective    Patient ID: Leah Day, female    DOB: 12-07-1945  Age: 78 y.o. MRN: [id number]  CC:  Chief Complaint  Patient presents with   Establish Care    Here to establish care. Due for DEXA.   Cough    Cough since 2013. Started as dry hacky cough. Worse now. Does not know of any pulmonary disease. Taking inhalers.   Hip Pain    Right hip pain since 30's-40's. Got worse in 2018. Hurts when she walks. Cannot bend much.    Discussed the use of AI scribe software for clinical note transcription with the patient, who gave verbal consent to proceed.  History of Present Illness   Leah Day is a 78 year old female with coronary artery disease and stroke who presents with a chronic cough and hip pain. She is accompanied by her son.  She has a chronic cough that has progressively worsened over the years. The cough is dry, with a sensation of a 'cotton ball' stuck in her throat. She has a significant smoking history of 40 to 50 years, having quit in 2013 following a triple bypass surgery. The patient's son has observed that changes in humidity and temperature sometimes lead to a heavy cough. She also has a history of acid reflux, managed with famotidine and Nexium, though she feels the reflux is not well controlled and may be contributing to her cough.  She experiences right hip pain that has been present since her 30s to 40s. The pain is primarily triggered by lifting her leg, such as when dressing, but does not typically occur during walking. She has not had a hip x-ray but has undergone bone density tests. The pain is not associated with numbness or tingling. She did report a history of low back pain after lifting a heavy object, which was treated and resolved. She has used Voltaren gel for relief, but it provides minimal benefit.  Her past medical history includes coronary artery disease, with a slight heart attack in 2013 leading to a triple coronary  artery bypass graft. She experienced atrial fibrillation in 2022, coinciding with a stroke, but is no longer in AFib. Her current medications include metoprolol , Eliquis , and Farxiga  for her cardiac conditions.  She has a history of breast cancer on the left side, treated with a mastectomy and saline implant, with no subsequent issues. Additionally, she has hypertension, hyperlipidemia, and thyroid  issues, all managed with medication. She takes her thyroid  medication before breakfast as instructed.  Her family history includes diabetes on her mother's side, though she does not have diabetes herself. She is a former smoker with a 40 to 50 year history, having quit in 2013.      Screenings:  Colon Cancer: utd Lung Cancer: no smoking or vaping Breast Cancer: history of left-sided breast cancer status postmastectomy Diabetes: indicated HLD: indicated   Outpatient Encounter Medications as of 09/25/2024  Medication Sig   albuterol  (VENTOLIN  HFA) 108 (90 Base) MCG/ACT inhaler Inhale 2 puffs into the lungs daily as needed (cough).   apixaban  (ELIQUIS ) 5 MG TABS tablet Take 1 tablet (5 mg total) by mouth 2 (two) times daily.   AREXVY 120 MCG/0.5ML injection Inject 0.5 mLs into the muscle once.   aspirin  81 MG EC tablet Take 81 mg by mouth daily.   BREO ELLIPTA 100-25 MCG/ACT AEPB Inhale 1 puff into the lungs daily.   cholecalciferol (VITAMIN D3) 25 MCG (1000 UNIT) tablet Take 1,000  Units by mouth daily.   dapagliflozin  propanediol (FARXIGA ) 10 MG TABS tablet Take 1 tablet (10 mg total) by mouth daily before breakfast.   famotidine (PEPCID) 20 MG tablet Take 20 mg by mouth as needed for heartburn.   levothyroxine  (SYNTHROID ) 125 MCG tablet Take 125 mcg by mouth daily before breakfast.   loratadine (CLARITIN) 10 MG tablet Take 10 mg by mouth daily.   metoprolol  succinate (TOPROL -XL) 25 MG 24 hr tablet TAKE 1 TABLET BY MOUTH DAILY. TAKE WITH OR IMMEDIATELY FOLLOWING A MEAL.   montelukast  (SINGULAIR )  10 MG tablet Take 10 mg by mouth at bedtime.   Multiple Vitamins-Minerals (MULTIVITAMIN WITH MINERALS) tablet Take 1 tablet by mouth daily.   pantoprazole  (PROTONIX ) 40 MG tablet Take 1 tablet (40 mg total) by mouth daily.   rosuvastatin  (CRESTOR ) 20 MG tablet Take 1 tablet (20 mg total) by mouth daily.   sacubitril -valsartan  (ENTRESTO ) 97-103 MG Take 1 tablet by mouth 2 (two) times daily.   spironolactone  (ALDACTONE ) 25 MG tablet Take 1 tablet (25 mg total) by mouth every morning.   tiZANidine (ZANAFLEX) 2 MG tablet Take 2 mg by mouth every 8 (eight) hours as needed.   vitamin B-12 (CYANOCOBALAMIN) 1000 MCG tablet Take 1,000 mcg by mouth daily.   vitamin C (ASCORBIC ACID) 500 MG tablet Take 500 mg by mouth daily.   [DISCONTINUED] esomeprazole (NEXIUM) 40 MG capsule Take 40 mg by mouth daily.   [DISCONTINUED] Bempedoic Acid -Ezetimibe  (NEXLIZET ) 180-10 MG TABS Take 1 tablet by mouth in the morning.   [DISCONTINUED] FLUZONE HIGH-DOSE 0.5 ML injection Inject 0.5 mLs into the muscle once.   No facility-administered encounter medications on file as of 09/25/2024.    Past Medical History:  Diagnosis Date   Allergy    Arthritis    Dont know   Asthma 78 yrs old   Atrial fibrillation (HCC)    Breast cancer (HCC)    Cardiomyopathy (HCC)    Cataract 2020   CHF (congestive heart failure) (HCC) 1994   Coronary artery disease    GERD (gastroesophageal reflux disease) 2010   Heart murmur 18   History of renal calculi 01/13/2017   Hyperlipidemia    Hypertension    Myocardial infarction [facility])    Osteoporosis 80yrs ago   Stroke [facility]) July 2022    Past Surgical History:  Procedure Laterality Date   APPENDECTOMY     ATRIAL FIBRILLATION ABLATION N/A 04/02/2022   Procedure: ATRIAL FIBRILLATION ABLATION;  Surgeon: Cindie Ole DASEN, MD;  Location: [facility];  Service: Cardiovascular;  Laterality: N/A;   BREAST SURGERY  1994   BUBBLE STUDY  08/05/2021   Procedure: BUBBLE STUDY;  Surgeon:  Michele Richardson, DO;  Location: [facility] ENDOSCOPY;  Service: Cardiovascular;;   CARDIAC CATHETERIZATION     CARDIOVERSION N/A 08/05/2021   Procedure: CARDIOVERSION;  Surgeon: Michele Richardson, DO;  Location: [facility] ENDOSCOPY;  Service: Cardiovascular;  Laterality: N/A;   CARDIOVERSION N/A 05/12/2022   Procedure: CARDIOVERSION;  Surgeon: Hobart Powell BRAVO, MD;  Location: [facility] ENDOSCOPY;  Service: Cardiovascular;  Laterality: N/A;   CARPAL TUNNEL RELEASE Right    CARPAL TUNNEL RELEASE Left    CHOLECYSTECTOMY  1970   CORONARY ARTERY BYPASS GRAFT     EYE SURGERY  2020   FRACTURE SURGERY  2008   left hand   TEE WITHOUT CARDIOVERSION N/A 08/05/2021   Procedure: TRANSESOPHAGEAL ECHOCARDIOGRAM (TEE);  Surgeon: Michele Richardson, DO;  Location: [facility] ENDOSCOPY;  Service: Cardiovascular;  Laterality: N/A;   TUBAL LIGATION  1978    Family History  Problem Relation Age of Onset   Heart attack Father    Alcohol abuse Father    Early death Father    Heart disease Father     Social History   Socioeconomic History   Marital status: Divorced    Spouse name: Not on file   Number of children: 2   Years of education: Not on file   Highest education level: GED or equivalent  Occupational History   Not on file  Tobacco Use   Smoking status: Former    Current packs/day: 0.00    Average packs/day: 1.5 packs/day for 30.7 years (46.0 ttl pk-yrs)    Types: Cigarettes    Start date: 97    Quit date: 07/08/2012    Years since quitting: 12.2    Passive exposure: Never   Smokeless tobacco: Never   Tobacco comments:    Former smoker 04/30/22  Vaping Use   Vaping status: Never Used  Substance and Sexual Activity   Alcohol use: Not Currently    Comment: Do not use alcohol   Drug use: Never   Sexual activity: Not Currently    Birth control/protection: None  Other Topics Concern   Not on file  Social History Narrative   Not on file   Social Drivers of Health   Financial Resource Strain: Low Risk  (09/21/2024)    Overall Financial Resource Strain (CARDIA)    Difficulty of Paying Living Expenses: Not very hard  Food Insecurity: Food Insecurity Present (09/21/2024)   Hunger Vital Sign    Worried About Running Out of Food in the Last Year: Sometimes true    Ran Out of Food in the Last Year: Never true  Transportation Needs: No Transportation Needs (09/21/2024)   PRAPARE - Administrator, Civil Service (Medical): No    Lack of Transportation (Non-Medical): No  Physical Activity: Insufficiently Active (09/21/2024)   Exercise Vital Sign    Days of Exercise per Week: 7 days    Minutes of Exercise per Session: 20 min  Stress: Stress Concern Present (09/21/2024)   [employer] of Occupational Health - Occupational Stress Questionnaire    Feeling of Stress: To some extent  Social Connections: Moderately Integrated (09/21/2024)   Social Connection and Isolation Panel    Frequency of Communication with Friends and Family: More than three times a week    Frequency of Social Gatherings with Friends and Family: Once a week    Attends Religious Services: More than 4 times per year    Active Member of [employer] or Organizations: Yes    Attends Banker Meetings: 1 to 4 times per year    Marital Status: Divorced  Intimate Partner Violence: Patient Unable To Answer (09/25/2024)   Humiliation, Afraid, Rape, and Kick questionnaire    Fear of Current or Ex-Partner: Patient unable to answer    Emotionally Abused: Patient unable to answer    Physically Abused: Patient unable to answer    Sexually Abused: Patient unable to answer    ROS  Per HPI      Objective    BP (!) 148/75   Pulse (!) 54   Temp 98.3 F (36.8 C) (Oral)   Resp 16   Ht 5' 1.22 (1.555 m)   Wt 177 lb 6.4 oz (80.5 kg)   SpO2 98%   BMI 33.28 kg/m   Physical Exam Constitutional:      General: She is not in acute  distress.    Appearance: Normal appearance. She is not ill-appearing.  HENT:     Head:  Normocephalic and atraumatic.     Nose: Nose normal.  Eyes:     General: No scleral icterus.    Conjunctiva/sclera: Conjunctivae normal.  Cardiovascular:     Rate and Rhythm: Normal rate and regular rhythm.     Heart sounds:     No friction rub. No gallop.  Pulmonary:     Effort: Pulmonary effort is normal. No respiratory distress.     Breath sounds: Normal breath sounds. No wheezing, rhonchi or rales.  Musculoskeletal:        General: Normal range of motion.     Right lower leg: No edema.     Left lower leg: No edema.     Comments: Normal right knee exam.  Normal low back exam. Mildly positive windshield wiper test to right hip.  No pain to palpation over the greater trochanter.  Normal range of motion without much grinding.  Skin:    General: Skin is warm and dry.     Coloration: Skin is not jaundiced or pale.  Neurological:     General: No focal deficit present.     Mental Status: She is alert.  Psychiatric:        Mood and Affect: Mood normal.        Behavior: Behavior normal.         Assessment & Plan:   Assessment and Plan    Encounter to establish care  Essential hypertension Chronic, not at goal.  Blood pressure is elevated. Regular monitoring is advised. 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 -Maintained on GDMT - Continue current medications - Follow up with cardiology in March  History of stroke Stroke in 2022, previously in atrial fibrillation. Managed with Eliquis  and Toprol -XL. - Continue Eliquis  for anticoagulation - Continue Toprol -XL for rate control  Hyperlipidemia -Chronic, stable. - Continue  Crestor   Hypothyroidism Chronic, stable.  Managed with thyroid  medication taken before breakfast. - Continue current thyroid  medication regimen  Gastroesophageal reflux disease with chronic cough Chronic cough may be related to GERD. Current treatment with Nexium and famotidine is insufficient for symptomatic heartburn treatment. Possible COPD due to extensive smoking history.  We will try different PPI to see if it works any better for her. - Discontinued Nexium - Started Protonix  once daily in the morning before food, 40 mg - Referred to pulmonology for evaluation of chronic cough and PFTs to assess for COPD - Assess baseline CBC and CMP - Will consider urea breath test for H. pylori if symptoms persist  Right hip pain Chronic right hip pain, possibly due to arthritis. Pain exacerbated by certain movements and activities. No significant findings on physical exam. - Ordered right hip x-ray - Will consider physical therapy or referral to sports medicine if x-ray is unremarkable - Use Tylenol  Arthritis or Voltaren gel for pain management  History of breast cancer status post left mastectomy Left mastectomy with saline implant.  General Health Maintenance Due for bone density scan. Colon cancer screening up to date with last Cologuard in 2024. No smoking or vaping history. - Ordered bone  density scan - Continue Cologuard every three years      Return in about 4 weeks (around 10/23/2024) for hip pain/cough.   Kennetta Pavlovic T Sheena Simonis, PA-C

## 2024-09-25 NOTE — Patient Instructions (Addendum)
 It was nice to see you today!  As we discussed in clinic: - Please take some time to get a Shingrix & TDAP at your pharmacy due to insurance reasons.  For your reflux- you may try the following. Anti-reflux measures such as raising the head of the bed, avoiding tight clothing or belts, avoiding eating late at night and not lying down shortly after mealtime and achieving weight loss. Avoid Aspirin , NSAID's such as ibuprofen and aleve), caffeine, peppermints, alcohol, and tobacco.   If you have any problems before your next visit feel free to message me via MyChart (minor issues or questions) or call the office, otherwise you may reach out to schedule an office visit.  Thank you! Hollee Fate, PA-C

## 2024-09-26 DIAGNOSIS — R053 Chronic cough: Secondary | ICD-10-CM | POA: Insufficient documentation

## 2024-09-26 LAB — CBC WITH DIFFERENTIAL/PLATELET
Basophils Absolute: 0.1 x10E3/uL (ref 0.0–0.2)
Basos: 1 %
EOS (ABSOLUTE): 0.1 x10E3/uL (ref 0.0–0.4)
Eos: 1 %
Hematocrit: 42.5 % (ref 34.0–46.6)
Hemoglobin: 14.1 g/dL (ref 11.1–15.9)
Immature Grans (Abs): 0 x10E3/uL (ref 0.0–0.1)
Immature Granulocytes: 0 %
Lymphocytes Absolute: 2.7 x10E3/uL (ref 0.7–3.1)
Lymphs: 30 %
MCH: 32.1 pg (ref 26.6–33.0)
MCHC: 33.2 g/dL (ref 31.5–35.7)
MCV: 97 fL (ref 79–97)
Monocytes Absolute: 0.7 x10E3/uL (ref 0.1–0.9)
Monocytes: 8 %
Neutrophils Absolute: 5.5 x10E3/uL (ref 1.4–7.0)
Neutrophils: 60 %
Platelets: 188 x10E3/uL (ref 150–450)
RBC: 4.39 x10E6/uL (ref 3.77–5.28)
RDW: 12.8 % (ref 11.7–15.4)
WBC: 9 x10E3/uL (ref 3.4–10.8)

## 2024-09-26 LAB — COMPREHENSIVE METABOLIC PANEL WITH GFR
ALT: 20 IU/L (ref 0–32)
AST: 23 IU/L (ref 0–40)
Albumin: 4.6 g/dL (ref 3.8–4.8)
Alkaline Phosphatase: 70 IU/L (ref 49–135)
BUN/Creatinine Ratio: 16 (ref 12–28)
BUN: 20 mg/dL (ref 8–27)
Bilirubin Total: 0.3 mg/dL (ref 0.0–1.2)
CO2: 24 mmol/L (ref 20–29)
Calcium: 9.2 mg/dL (ref 8.7–10.3)
Chloride: 104 mmol/L (ref 96–106)
Creatinine, Ser: 1.22 mg/dL — ABNORMAL HIGH (ref 0.57–1.00)
Globulin, Total: 2.2 g/dL (ref 1.5–4.5)
Glucose: 97 mg/dL (ref 70–99)
Potassium: 4.6 mmol/L (ref 3.5–5.2)
Sodium: 139 mmol/L (ref 134–144)
Total Protein: 6.8 g/dL (ref 6.0–8.5)
eGFR: 46 mL/min/1.73 — ABNORMAL LOW (ref >59)

## 2024-09-28 ENCOUNTER — Ambulatory Visit (HOSPITAL_BASED_OUTPATIENT_CLINIC_OR_DEPARTMENT_OTHER): Payer: Self-pay | Admitting: Student

## 2024-09-28 DIAGNOSIS — N1831 Chronic kidney disease, stage 3a: Secondary | ICD-10-CM

## 2024-10-01 ENCOUNTER — Ambulatory Visit (HOSPITAL_BASED_OUTPATIENT_CLINIC_OR_DEPARTMENT_OTHER)
Admission: RE | Admit: 2024-10-01 | Discharge: 2024-10-01 | Disposition: A | Discharge status: Home / Self Care | Source: Ambulatory Visit | Attending: Student | Admitting: Student

## 2024-10-01 ENCOUNTER — Encounter (HOSPITAL_BASED_OUTPATIENT_CLINIC_OR_DEPARTMENT_OTHER): Payer: Self-pay

## 2024-10-01 DIAGNOSIS — Z780 Asymptomatic menopausal state: Secondary | ICD-10-CM

## 2024-10-23 ENCOUNTER — Encounter (HOSPITAL_BASED_OUTPATIENT_CLINIC_OR_DEPARTMENT_OTHER): Payer: Self-pay | Admitting: Student

## 2024-10-23 ENCOUNTER — Ambulatory Visit (INDEPENDENT_AMBULATORY_CARE_PROVIDER_SITE_OTHER): Admitting: Student

## 2024-10-23 VITALS — BP 137/74 | HR 50 | Temp 98.1°F | Resp 16 | Ht 61.22 in | Wt 173.0 lb

## 2024-10-23 DIAGNOSIS — K219 Gastro-esophageal reflux disease without esophagitis: Secondary | ICD-10-CM

## 2024-10-23 DIAGNOSIS — E039 Hypothyroidism, unspecified: Secondary | ICD-10-CM

## 2024-10-23 DIAGNOSIS — I4819 Other persistent atrial fibrillation: Secondary | ICD-10-CM | POA: Diagnosis not present

## 2024-10-23 DIAGNOSIS — E785 Hyperlipidemia, unspecified: Secondary | ICD-10-CM | POA: Diagnosis not present

## 2024-10-23 DIAGNOSIS — I130 Hypertensive heart and chronic kidney disease with heart failure and stage 1 through stage 4 chronic kidney disease, or unspecified chronic kidney disease: Secondary | ICD-10-CM

## 2024-10-23 DIAGNOSIS — N1831 Chronic kidney disease, stage 3a: Secondary | ICD-10-CM | POA: Diagnosis not present

## 2024-10-23 DIAGNOSIS — R053 Chronic cough: Secondary | ICD-10-CM

## 2024-10-23 DIAGNOSIS — I10 Essential (primary) hypertension: Secondary | ICD-10-CM

## 2024-10-23 DIAGNOSIS — I5022 Chronic systolic (congestive) heart failure: Secondary | ICD-10-CM

## 2024-10-23 MED ORDER — OMEPRAZOLE 40 MG PO CPDR: 40.0000 mg | DELAYED_RELEASE_CAPSULE | Freq: Every day | ORAL | 6 refills | Status: AC

## 2024-10-23 NOTE — Patient Instructions (Addendum)
 Please call [facility]: Address: [address] [place], [place] [address] Phone: [phone]

## 2024-10-23 NOTE — Progress Notes (Unsigned)
 Established Patient Office Visit  Subjective   Patient ID: Leah Day, female    DOB: Apr 01, 1946  Age: 78 y.o. MRN: [id number]  Chief Complaint  Patient presents with   Medical Management of Chronic Issues    Follow up. Stopped taking protonix  due to stomach aches. It did help throat issue of feeling like she had a cotton ball. Coughing is still the same.     HPI  Discussed the use of AI scribe software for clinical note transcription with the patient, who gave verbal consent to proceed.  History of Present Illness          Patient Active Problem List   Diagnosis Date Noted   Chronic cough 09/26/2024   Hyperlipidemia 09/25/2024   Chronic HFrEF (heart failure with reduced ejection fraction) (HCC)    Atrial fibrillation (HCC) 05/24/2021   Stroke-like symptoms    Stroke determined by clinical assessment (HCC) 05/22/2021   SUI (stress urinary incontinence, female) 11/09/2016   Gastroesophageal reflux disease without esophagitis 01/08/2016   Essential hypertension 12/08/2015   Hypothyroidism 12/08/2015   Postsurgical aortocoronary bypass status 12/08/2015   Past Medical History:  Diagnosis Date   Allergy    Arthritis    Dont know   Asthma 78 yrs old   Atrial fibrillation (HCC)    Breast cancer (HCC)    Cardiomyopathy (HCC)    Cataract 2020   CHF (congestive heart failure) (HCC) 1994   Coronary artery disease    GERD (gastroesophageal reflux disease) 2010   Heart murmur 18   History of renal calculi 01/13/2017   Hyperlipidemia    Hypertension    Myocardial infarction [facility])    Osteoporosis 7yrs ago   Stroke [facility]) July 2022   Social History[1] Allergies[2]    ROS Per HPI.    Objective:     BP 137/74   Pulse (!) 50   Temp 98.1 F (36.7 C) (Oral)   Resp 16   Ht 5' 1.22 (1.555 m)   Wt 173 lb (78.5 kg)   SpO2 97%   BMI 32.45 kg/m  BP Readings from Last 3 Encounters:  10/23/24 137/74  09/25/24 (!) 148/75  02/02/24 136/78   Wt Readings from  Last 3 Encounters:  10/23/24 173 lb (78.5 kg)  09/25/24 177 lb 6.4 oz (80.5 kg)  02/02/24 182 lb 12.8 oz (82.9 kg)   SpO2 Readings from Last 3 Encounters:  10/23/24 97%  09/25/24 98%  02/02/24 98%      Physical Exam   No results found for any visits on 10/23/24.  Last CBC Lab Results  Component Value Date   WBC 9.0 09/25/2024   HGB 14.1 09/25/2024   HCT 42.5 09/25/2024   MCV 97 09/25/2024   MCH 32.1 09/25/2024   RDW 12.8 09/25/2024   PLT 188 09/25/2024   Last metabolic panel Lab Results  Component Value Date   GLUCOSE 97 09/25/2024   NA 139 09/25/2024   K 4.6 09/25/2024   CL 104 09/25/2024   CO2 24 09/25/2024   BUN 20 09/25/2024   CREATININE 1.22 (H) 09/25/2024   EGFR 46 (L) 09/25/2024   CALCIUM  9.2 09/25/2024   PROT 6.8 09/25/2024   ALBUMIN 4.6 09/25/2024   LABGLOB 2.2 09/25/2024   BILITOT 0.3 09/25/2024   ALKPHOS 70 09/25/2024   AST 23 09/25/2024   ALT 20 09/25/2024   ANIONGAP 8 04/30/2022   Last lipids Lab Results  Component Value Date   CHOL 156 03/14/2024   HDL 67  03/14/2024   LDLCALC 75 03/14/2024   LDLDIRECT 84 07/13/2023   TRIG 71 03/14/2024   CHOLHDL 2.3 03/14/2024   Last hemoglobin A1c Lab Results  Component Value Date   HGBA1C 5.6 05/23/2021      The ASCVD Risk score (Arnett DK, et al., 2019) failed to calculate for the following reasons:   Risk score cannot be calculated because patient has a medical history suggesting prior/existing ASCVD   * - Cholesterol units were assumed    Assessment & Plan:   There are no diagnoses linked to this encounter.  Assessment and Plan            No follow-ups on file.    Riyana Biel T Wyndham Santilli, PA-C    [1]  Social History Tobacco Use   Smoking status: Former    Current packs/day: 0.00    Average packs/day: 1.5 packs/day for 30.7 years (46.0 ttl pk-yrs)    Types: Cigarettes    Start date: 1    Quit date: 07/08/2012    Years since quitting: 12.3    Passive exposure: Never    Smokeless tobacco: Never   Tobacco comments:    Former smoker 04/30/22  Vaping Use   Vaping status: Never Used  Substance Use Topics   Alcohol use: Not Currently    Comment: Do not use alcohol   Drug use: Never  [2]  Allergies Allergen Reactions   Cephalexin Rash        Atenolol Swelling   Irbesartan Cough   Lisinopril Cough

## 2024-10-26 DIAGNOSIS — N1831 Chronic kidney disease, stage 3a: Secondary | ICD-10-CM | POA: Insufficient documentation

## 2024-11-19 ENCOUNTER — Ambulatory Visit

## 2024-11-19 VITALS — BP 133/81 | HR 56 | Ht 61.0 in | Wt 176.8 lb

## 2024-11-19 DIAGNOSIS — R918 Other nonspecific abnormal finding of lung field: Secondary | ICD-10-CM

## 2024-11-19 DIAGNOSIS — R053 Chronic cough: Secondary | ICD-10-CM | POA: Diagnosis not present

## 2024-11-19 DIAGNOSIS — K219 Gastro-esophageal reflux disease without esophagitis: Secondary | ICD-10-CM | POA: Diagnosis not present

## 2024-11-19 DIAGNOSIS — Z87891 Personal history of nicotine dependence: Secondary | ICD-10-CM

## 2024-11-19 NOTE — Progress Notes (Signed)
 "   Subjective:   PATIENT ID: Leah Day GENDER: female DOB: 07/13/1946, MRN: [id number]   HPI Discussed the use of AI scribe software for clinical note transcription with the patient, who gave verbal consent to proceed.  History of Present Illness Leah Day is a 79 year old female with heart failure and coronary artery disease who presents with a persistent cough.  She has experienced a persistent cough since December, describing it as a sensation of a 'cotton ball' in her throat. She has a history of acid reflux and esophageal issues, which she believes may be contributing to her cough. Initially, she tried Protonix  but switched to omeprazole  due to Day effects, including the sensation in her throat. Omeprazole  is better tolerated. The cough is aggravated by spicy foods and citrus.  Her past medical history includes heart failure with a recovered ejection fraction, managed with Entresto . She underwent bypass surgery in 2013. She has a significant smoking history, having quit in 2013 after smoking a pack a day for approximately 50 years. No past pulmonary issues or breathing difficulties, except for sinus problems and a history of childhood asthma, which resolved without ongoing symptoms.  In May 2023, a CT cardiac scan revealed a small distal paraesophageal hiatal hernia with esophageal wall thickening and esophagitis. The scan also showed subpleural pulmonary nodules and small ground glass opacities in the lungs. She reports no significant shortness of breath, only mild episodes, and denies any wheezing.  She has a history of psoriasis and thyroid  problems. She also mentions having hip problems, although details are not elaborated.     Past Medical History:  Diagnosis Date   Allergy    Arthritis    Dont know   Asthma 79 yrs old   Atrial fibrillation (HCC)    Breast cancer (HCC)    Cardiomyopathy (HCC)    Cataract 2020   CHF (congestive heart failure) (HCC) 1994    Coronary artery disease    GERD (gastroesophageal reflux disease) 2010   Heart murmur 19   History of renal calculi 01/13/2017   Hyperlipidemia    Hypertension    Myocardial infarction (HCC)    Osteoporosis 45yrs ago   Stroke [facility]) July 2022     Family History  Problem Relation Age of Onset   Heart attack Father    Alcohol abuse Father    Early death Father    Heart disease Father      Social History   Socioeconomic History   Marital status: Divorced    Spouse name: Not on file   Number of children: 2   Years of education: Not on file   Highest education level: GED or equivalent  Occupational History   Not on file  Tobacco Use   Smoking status: Former    Current packs/day: 0.00    Average packs/day: 1.5 packs/day for 30.7 years (46.0 ttl pk-yrs)    Types: Cigarettes    Start date: 77    Quit date: 07/08/2012    Years since quitting: 12.3    Passive exposure: Never   Smokeless tobacco: Never   Tobacco comments:    Quit 2013  Vaping Use   Vaping status: Former  Substance and Sexual Activity   Alcohol use: Not Currently    Comment: Do not use alcohol   Drug use: Never   Sexual activity: Not Currently    Birth control/protection: None  Other Topics Concern   Not on file  Social History Narrative   Not on  file   Social Drivers of Health   Tobacco Use: Medium Risk (11/19/2024)   Patient History    Smoking Tobacco Use: Former    Smokeless Tobacco Use: Never    Passive Exposure: Never  Physicist, Medical Strain: Low Risk (09/21/2024)   Overall Financial Resource Strain (CARDIA)    Difficulty of Paying Living Expenses: Not very hard  Food Insecurity: Food Insecurity Present (10/23/2024)   Epic    Worried About Programme Researcher, Broadcasting/film/video in the Last Year: Sometimes true    Ran Out of Food in the Last Year: Never true  Transportation Needs: No Transportation Needs (10/23/2024)   Epic    Lack of Transportation (Medical): No    Lack of Transportation (Non-Medical):  No  Physical Activity: Insufficiently Active (09/21/2024)   Exercise Vital Sign    Days of Exercise per Week: 7 days    Minutes of Exercise per Session: 20 min  Stress: Stress Concern Present (09/21/2024)   [employer] of Occupational Health - Occupational Stress Questionnaire    Feeling of Stress: To some extent  Social Connections: Moderately Integrated (09/21/2024)   Social Connection and Isolation Panel    Frequency of Communication with Friends and Family: More than three times a week    Frequency of Social Gatherings with Friends and Family: Once a week    Attends Religious Services: More than 4 times per year    Active Member of [employer] or Organizations: Yes    Attends Banker Meetings: 1 to 4 times per year    Marital Status: Divorced  Intimate Partner Violence: Not At Risk (10/23/2024)   Epic    Fear of Current or Ex-Partner: No    Emotionally Abused: No    Physically Abused: No    Sexually Abused: No  Depression (PHQ2-9): Low Risk (09/25/2024)   Depression (PHQ2-9)    PHQ-2 Score: 1  Alcohol Screen: Not on file  Housing: Unknown (10/23/2024)   Epic    Unable to Pay for Housing in the Last Year: No    Number of Times Moved in the Last Year: Not on file    Homeless in the Last Year: No  Utilities: Not At Risk (10/23/2024)   Epic    Threatened with loss of utilities: No  Health Literacy: Not on file     Allergies[1]   Outpatient Medications Prior to Visit  Medication Sig Dispense Refill   albuterol  (VENTOLIN  HFA) 108 (90 Base) MCG/ACT inhaler Inhale 2 puffs into the lungs daily as needed (cough).     apixaban  (ELIQUIS ) 5 MG TABS tablet Take 1 tablet (5 mg total) by mouth 2 (two) times daily. 180 tablet 3   aspirin  81 MG EC tablet Take 81 mg by mouth daily.     cholecalciferol (VITAMIN D3) 25 MCG (1000 UNIT) tablet Take 1,000 Units by mouth daily.     dapagliflozin  propanediol (FARXIGA ) 10 MG TABS tablet Take 1 tablet (10 mg total) by mouth daily  before breakfast. 90 tablet 3   famotidine (PEPCID) 20 MG tablet Take 20 mg by mouth as needed for heartburn.     levothyroxine  (SYNTHROID ) 125 MCG tablet Take 125 mcg by mouth daily before breakfast.     loratadine (CLARITIN) 10 MG tablet Take 10 mg by mouth daily.     metoprolol  succinate (TOPROL -XL) 25 MG 24 hr tablet TAKE 1 TABLET BY MOUTH DAILY. TAKE WITH OR IMMEDIATELY FOLLOWING A MEAL. 90 tablet 2   montelukast  (SINGULAIR ) 10 MG tablet Take  10 mg by mouth at bedtime.     Multiple Vitamins-Minerals (MULTIVITAMIN WITH MINERALS) tablet Take 1 tablet by mouth daily.     omeprazole  (PRILOSEC) 40 MG capsule Take 1 capsule (40 mg total) by mouth daily. 30 capsule 6   pyridOXINE (B-6) 50 MG tablet Take 50 mg by mouth daily.     rosuvastatin  (CRESTOR ) 20 MG tablet Take 1 tablet (20 mg total) by mouth daily. 90 tablet 2   sacubitril -valsartan  (ENTRESTO ) 97-103 MG Take 1 tablet by mouth 2 (two) times daily. 180 tablet 1   spironolactone  (ALDACTONE ) 25 MG tablet Take 1 tablet (25 mg total) by mouth every morning. 90 tablet 1   tiZANidine (ZANAFLEX) 2 MG tablet Take 2 mg by mouth every 8 (eight) hours as needed.     vitamin B-12 (CYANOCOBALAMIN) 1000 MCG tablet Take 1,000 mcg by mouth daily.     vitamin C (ASCORBIC ACID) 500 MG tablet Take 500 mg by mouth daily.     AREXVY 120 MCG/0.5ML injection Inject 0.5 mLs into the muscle once.     BREO ELLIPTA 100-25 MCG/ACT AEPB Inhale 1 puff into the lungs daily. (Patient not taking: Reported on 11/19/2024)     No facility-administered medications prior to visit.    ROS Reviewed all systems and reported negative except as above     Objective:   Vitals:   11/19/24 1437  BP: 133/81  Pulse: (!) 56  SpO2: 97%  Weight: 176 lb 12.8 oz (80.2 kg)  Height: 5' 1 (1.549 m)    Physical Exam Physical Exam GENERAL: Appropriate to age, no acute distress. HEAD EYES EARS NOSE THROAT: Moist mucous membranes, atraumatic, normocephalic. CHEST: Clear to  auscultation bilaterally, no wheezing, no crackles, no rales. CARDIAC: Regular rate and rhythm, normal S1, normal S2, no murmurs, no rubs, no gallops. ABDOMEN: Soft, nontender. NEUROLOGICAL: Motor and sensation grossly intact, alert and oriented times X 3. EXTREMITIES: Warm, well perfused, no edema.     CBC    Component Value Date/Time   WBC 9.0 09/25/2024 1526   WBC 7.6 04/30/2022 1430   RBC 4.39 09/25/2024 1526   RBC 4.43 04/30/2022 1430   HGB 14.1 09/25/2024 1526   HCT 42.5 09/25/2024 1526   PLT 188 09/25/2024 1526   MCV 97 09/25/2024 1526   MCH 32.1 09/25/2024 1526   MCH 31.8 04/30/2022 1430   MCHC 33.2 09/25/2024 1526   MCHC 33.7 04/30/2022 1430   RDW 12.8 09/25/2024 1526   LYMPHSABS 2.7 09/25/2024 1526   MONOABS 0.8 05/22/2021 1626   EOSABS 0.1 09/25/2024 1526   BASOSABS 0.1 09/25/2024 1526     Results Radiology Cardiac CT (03/2022): Small distal paraesophageal hiatal hernia, esophageal wall thickening, esophagitis, subpleural pulmonary nodules, small ground glass opacities in the lungs   PFT:     No data to display               Assessment & Plan:   Assessment and Plan Assessment & Plan Chronic cough due to gastroesophageal reflux disease Chronic cough likely due to GERD with esophageal wall thickening and esophagitis. Omeprazole  used after Protonix  intolerance. GERD diet recommended. - Continue omeprazole . - Implement GERD diet: avoid citrus fruits, whole milk, fatty foods, fried foods, onions, garlic, tomatoes, alcohol, chocolate, coffee, carbonated beverages, sweet beverages, and menthol. Consume lean fish, eggs, chicken, boiled vegetables, healthy non-acidic fruits, quinoa, seeds, and white rice. - Eat small meals throughout the day and avoid eating three hours before sleep. - Ordered CT scan of  the chest. - Ordered pulmonary function tests (PFTs).  Pulmonary nodules under surveillance Small peripheral pulmonary nodules noted on previous CT  scan. Further evaluation warranted due to smoking history and chronic cough. - Ordered CT scan of the chest for surveillance of pulmonary nodules.        Zola Herter, MD [facility] Office: [phone]        [1]  Allergies Allergen Reactions   Cephalexin Rash        Atenolol Swelling   Irbesartan Cough   Lisinopril Cough   "

## 2024-11-19 NOTE — Patient Instructions (Signed)
" °  VISIT SUMMARY: Leah Day, a 79 year old female with a history of heart failure and coronary artery disease, presented with a persistent cough. She has a history of acid reflux and esophageal issues, which she believes may be contributing to her cough. She has been managing her symptoms with omeprazole  and has been advised to follow a GERD diet. Additionally, she has small pulmonary nodules that require further evaluation due to her smoking history and chronic cough.  YOUR PLAN: -CHRONIC COUGH DUE TO GASTROESOPHAGEAL REFLUX DISEASE (GERD): Your chronic cough is likely due to gastroesophageal reflux disease (GERD), which is when stomach acid frequently flows back into the tube connecting your mouth and stomach (esophagus). This backwash (acid reflux) can irritate the lining of your esophagus. You should continue taking omeprazole  and follow a GERD diet, which includes avoiding citrus fruits, whole milk, fatty foods, fried foods, onions, garlic, tomatoes, alcohol, chocolate, coffee, carbonated beverages, sweet beverages, and menthol. Instead, consume lean fish, eggs, chicken, boiled vegetables, healthy non-acidic fruits, quinoa, seeds, and white rice. Eat small meals throughout the day and avoid eating three hours before sleep. A CT scan of your chest and pulmonary function tests (PFTs) have been ordered to further evaluate your condition.  -PULMONARY NODULES UNDER SURVEILLANCE: Pulmonary nodules are small growths in the lungs. Given your smoking history and chronic cough, it is important to monitor these nodules. A CT scan of your chest has been ordered to keep an eye on these nodules and ensure they do not pose any health risks.  INSTRUCTIONS: Please follow up with the ordered CT scan of your chest and pulmonary function tests (PFTs). Continue taking omeprazole  and adhere to the GERD diet as discussed. If you experience any new or worsening symptoms, please contact our office.  GERD diet  instructions   Foods to Avoid:  Acidic foods: Citrus fruits, tomatoes, tomato-based products, vinegar, garlic, onions  Fatty foods: Fried foods, fatty meats, whole milk, cheese  Spicy foods: Chili peppers, black pepper, mustard  Chocolate: Contains caffeine and theobromine, which relax the lower esophageal sphincter (LES)  Alcohol: Relaxes the LES and increases stomach acid production  Caffeine: Found in coffee, tea, and some sodas, it can stimulate stomach acid production    Foods to Eat:  Lean proteins: Chicken, fish, eggs Non-acidic fruits: Apples, bananas, pears, grapes Vegetables: Steamed, roasted, or boiled vegetables (e.g., broccoli, carrots, green beans) Whole grains: Brown rice, quinoa, oatmeal Low-fat dairy: Skim milk, low-fat yogurt, low-fat cheese Water: Staying hydrated helps dilute stomach acid    Other Dietary Recommendations: Eat smaller, more frequent meals. Avoid eating within 2-3 hours of bedtime. Chew food thoroughly. Limit sugary drinks and processed foods. Consider a Mediterranean-style diet, which is rich in fruits, vegetables, and whole grains.        "

## 2024-11-23 ENCOUNTER — Ambulatory Visit
Admission: RE | Admit: 2024-11-23 | Discharge: 2024-11-23 | Disposition: A | Discharge status: Home / Self Care | Source: Ambulatory Visit

## 2024-11-23 DIAGNOSIS — K219 Gastro-esophageal reflux disease without esophagitis: Secondary | ICD-10-CM

## 2024-11-23 DIAGNOSIS — R053 Chronic cough: Secondary | ICD-10-CM

## 2025-01-21 ENCOUNTER — Ambulatory Visit (HOSPITAL_BASED_OUTPATIENT_CLINIC_OR_DEPARTMENT_OTHER): Admitting: Student

## 2025-02-04 ENCOUNTER — Ambulatory Visit: Admitting: Cardiology

## 2025-02-19 ENCOUNTER — Ambulatory Visit

## 2025-02-19 ENCOUNTER — Encounter
